# Patient Record
Sex: Male | Born: 1950 | Race: White | Hispanic: No | Marital: Married | Smoking: Former smoker
Health system: Southern US, Community
[De-identification: ages and names within clinical notes are randomized; demographics above are authoritative.]

## PROBLEM LIST (undated history)

## (undated) DIAGNOSIS — F419 Anxiety disorder, unspecified: Secondary | ICD-10-CM

## (undated) DIAGNOSIS — J449 Chronic obstructive pulmonary disease, unspecified: Secondary | ICD-10-CM

## (undated) DIAGNOSIS — F32A Depression, unspecified: Secondary | ICD-10-CM

## (undated) DIAGNOSIS — I861 Scrotal varices: Secondary | ICD-10-CM

## (undated) DIAGNOSIS — Z8547 Personal history of malignant neoplasm of testis: Secondary | ICD-10-CM

## (undated) DIAGNOSIS — K219 Gastro-esophageal reflux disease without esophagitis: Secondary | ICD-10-CM

## (undated) DIAGNOSIS — J189 Pneumonia, unspecified organism: Secondary | ICD-10-CM

## (undated) DIAGNOSIS — R351 Nocturia: Secondary | ICD-10-CM

## (undated) DIAGNOSIS — C801 Malignant (primary) neoplasm, unspecified: Secondary | ICD-10-CM

## (undated) DIAGNOSIS — Z720 Tobacco use: Secondary | ICD-10-CM

## (undated) DIAGNOSIS — Z8719 Personal history of other diseases of the digestive system: Secondary | ICD-10-CM

## (undated) DIAGNOSIS — F329 Major depressive disorder, single episode, unspecified: Secondary | ICD-10-CM

## (undated) DIAGNOSIS — M1990 Unspecified osteoarthritis, unspecified site: Secondary | ICD-10-CM

## (undated) DIAGNOSIS — Z87898 Personal history of other specified conditions: Secondary | ICD-10-CM

## (undated) DIAGNOSIS — N400 Enlarged prostate without lower urinary tract symptoms: Secondary | ICD-10-CM

## (undated) DIAGNOSIS — G4700 Insomnia, unspecified: Secondary | ICD-10-CM

## (undated) DIAGNOSIS — G250 Essential tremor: Secondary | ICD-10-CM

## (undated) DIAGNOSIS — Z973 Presence of spectacles and contact lenses: Secondary | ICD-10-CM

## (undated) HISTORY — DX: Personal history of other specified conditions: Z87.898

## (undated) HISTORY — DX: Scrotal varices: I86.1

## (undated) HISTORY — PX: MASTECTOMY: SHX3

## (undated) HISTORY — DX: Tobacco use: Z72.0

## (undated) HISTORY — PX: APPENDECTOMY: SHX54

## (undated) HISTORY — PX: OTHER SURGICAL HISTORY: SHX169

## (undated) HISTORY — DX: Personal history of malignant neoplasm of testis: Z85.47

---
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
## 1998-10-06 ENCOUNTER — Encounter: Payer: Self-pay | Admitting: Emergency Medicine

## 1998-10-06 ENCOUNTER — Emergency Department (HOSPITAL_COMMUNITY): Admission: EM | Admit: 1998-10-06 | Discharge: 1998-10-06 | Payer: Self-pay | Admitting: Emergency Medicine

## 1998-10-07 ENCOUNTER — Encounter: Payer: Self-pay | Admitting: Emergency Medicine

## 2001-04-02 ENCOUNTER — Encounter: Payer: Self-pay | Admitting: Emergency Medicine

## 2001-04-02 ENCOUNTER — Emergency Department (HOSPITAL_COMMUNITY): Admission: EM | Admit: 2001-04-02 | Discharge: 2001-04-02 | Payer: Self-pay | Admitting: Emergency Medicine

## 2001-11-29 ENCOUNTER — Observation Stay (HOSPITAL_COMMUNITY): Admission: EM | Admit: 2001-11-29 | Discharge: 2001-11-30 | Payer: Self-pay | Admitting: *Deleted

## 2001-11-29 ENCOUNTER — Encounter: Payer: Self-pay | Admitting: Emergency Medicine

## 2003-03-06 DIAGNOSIS — Z8547 Personal history of malignant neoplasm of testis: Secondary | ICD-10-CM

## 2003-03-06 HISTORY — PX: ORCHIECTOMY: SHX2116

## 2003-03-06 HISTORY — DX: Personal history of malignant neoplasm of testis: Z85.47

## 2003-09-20 ENCOUNTER — Encounter: Admission: RE | Admit: 2003-09-20 | Discharge: 2003-09-20 | Payer: Self-pay | Admitting: Urology

## 2003-09-24 ENCOUNTER — Ambulatory Visit (HOSPITAL_COMMUNITY): Admission: RE | Admit: 2003-09-24 | Discharge: 2003-09-24 | Payer: Self-pay | Admitting: Urology

## 2003-09-24 ENCOUNTER — Encounter (INDEPENDENT_AMBULATORY_CARE_PROVIDER_SITE_OTHER): Payer: Self-pay | Admitting: *Deleted

## 2003-09-24 ENCOUNTER — Ambulatory Visit (HOSPITAL_BASED_OUTPATIENT_CLINIC_OR_DEPARTMENT_OTHER): Admission: RE | Admit: 2003-09-24 | Discharge: 2003-09-24 | Payer: Self-pay | Admitting: Urology

## 2003-10-25 ENCOUNTER — Ambulatory Visit: Admission: RE | Admit: 2003-10-25 | Discharge: 2003-12-02 | Payer: Self-pay | Admitting: Radiation Oncology

## 2003-12-30 ENCOUNTER — Ambulatory Visit: Admission: RE | Admit: 2003-12-30 | Discharge: 2003-12-30 | Payer: Self-pay | Admitting: Radiation Oncology

## 2004-11-03 ENCOUNTER — Ambulatory Visit (HOSPITAL_COMMUNITY): Admission: RE | Admit: 2004-11-03 | Discharge: 2004-11-03 | Payer: Self-pay | Admitting: Urology

## 2005-03-28 ENCOUNTER — Encounter (INDEPENDENT_AMBULATORY_CARE_PROVIDER_SITE_OTHER): Payer: Self-pay | Admitting: Specialist

## 2005-03-28 ENCOUNTER — Ambulatory Visit (HOSPITAL_COMMUNITY): Admission: RE | Admit: 2005-03-28 | Discharge: 2005-03-28 | Payer: Self-pay | Admitting: Urology

## 2005-09-26 ENCOUNTER — Ambulatory Visit (HOSPITAL_COMMUNITY): Admission: RE | Admit: 2005-09-26 | Discharge: 2005-09-26 | Payer: Self-pay | Admitting: Urology

## 2006-11-08 ENCOUNTER — Ambulatory Visit (HOSPITAL_COMMUNITY): Admission: RE | Admit: 2006-11-08 | Discharge: 2006-11-08 | Payer: Self-pay | Admitting: Urology

## 2010-07-21 NOTE — Op Note (Signed)
NAME:  Cameron [name], Cameron [name]                        ACCOUNT NO.:  [id number]   MEDICAL RECORD NO.:  [id number]                   PATIENT TYPE:  INP   LOCATION:  4710                                 FACILITY:  [facility]   PHYSICIAN:  Duke Salvia, M.D. [facility]           DATE OF BIRTH:  08/11/1950   DATE OF PROCEDURE:  11/30/2001  DATE OF DISCHARGE:  11/30/2001                                 OPERATIVE REPORT   I had the privilege of seeing the patient in electrophysiology consultation  for an episode of syncope.   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  His wife heard a  thud.  Thinking it was her son having dropped a book, she waited and then  there was a crash.  She went into the bathroom and found her husband draped  over the toilet bowl, his face bleeding where he had apparently hit the  towel rack.  She and her son after a few minutes were able to help him up to  his feet and started back to the bedroom where he collapsed again.  He was  finally put to bed.  He was observed to be quite diaphoretic.  He was  extremely pale.  There were no comments about nausea.  Finally, he was  brought to the emergency department where he was found to be relatively  hypotensive with blood pressures in the high 80s.  His pulse was not  elevated and there was not significant evidence of orthostatic hypotension.   The patient was admitted and put on telemetry.  There patient had recurrent  episodes of junctional rhythm that had been asymptomatic.   The patient has no antecedent history of syncope or episodes  lightheadedness.  The patient does have some exertional and nonexertional  chest pain.  He was actually seen in the emergency room a couple of months  ago and was discharged with a diagnosis of gastritis.   His cardiac risk factors are notable for cigarettes at two packs per day.  He does not have  hypertension.  There is a family history of vascular  disease in his mother.  His cholesterol was 151, but a break down is not  available.   The patient also has a significant history of substance excessive substance  use such that his wife has been concerned about his alcohol intake.  He had  typically been drinking six plus beers plus mixed drinks per day.  This was  associated temporally also recently with an inability to sleep and eat and  he has lost about 20 some odd pounds.  Evaluation by Gaspar Garbe,  M.D. felt that this was likely related to depression and anxiety and he was  treated with clonazepam for sleep and Zoloft.  This was started a couple of  months ago.  Over the last couple of weeks, the patient has stopped drinking  alcohol during the week waiting until the weekend.  The evening of his  syncope, he had had tall beers as well as a mixed drink prior to going to  bed.  He uses a significant amount of caffeinated beverages.  He does not  have orthostatic lightheadedness in general.   PAST SURGICAL HISTORY:  Notable for surgery for undescended testicle,  varicocele surgery, appendectomy and then gynecomastia x 2 bilaterally.   REVIEW OF SYSTEMS:  Notable for recurrent problems with left arm tingling,  particularly on awakening.   FAMILY HISTORY:  Notable as above.   SOCIAL HISTORY:  It is also notable that he has two children.   PHYSICAL EXAMINATION:  VITAL SIGNS:  On examination initially on  presentation his blood pressure was 89/57.  __________.  Since then his  blood pressure has been 105 and today 110, 116/70.  His pulse was 56.  His  respirations were 14 and labored.  HEENT:  Exam demonstrated no scleral icterus, no xanthomata.  His neck veins  were flat.  The carotids were brisk and full bilaterally without bruits.  BACK:  Without kyphosis, scoliosis.  LUNGS:  Clear.  HEART:  Sounds were regular without murmurs or gallops.  ABDOMEN:  Soft with active  bowel sounds. No midline pulsation or  hepatomegaly.   Femoral pulses were 2+, distal pulses were intact.  No cyanosis, clubbing or  edema.  NEUROLOGICAL:  Exam was grossly normal.  SKIN:  Exam notable for repair of laceration across the bridge.   Electrocardiogram demonstrated sinus rhythm at 66 with intervals of  0.16/0.11/0.41.  There were no  STT changes.  There was 1 mm of ST segment  elevation in leads v1 to v3 consistent with early repolarization.   LABORATORY DATA:  Unrevealing.   IMPRESSION:  1. Syncope, probably early mediated and post micturition.  2. Junctional rhythm at baseline question asymptomatic sinus node     dysfunction.  3. Cigarette/alcohol use/abuse for depression/OCD on SSI therapy.   The patient likely had early mediated syncope temporally related to  urination.  This may have been aggravated by relative dehydration in the  setting of his alcohol intake from the previous night although with his  longstanding history of alcohol intake this would be a little bit  surprising.  It may also be that there was interaction between the  alcohol/dehydration and the Zoloft.  In some patient's SSI therapy has been  prosyncopal and when described for early mediated syncope.   The major differential would occur if the patient had structural heart  disease and specifically inferior ischemia associated with Bezold-Jarisch  reflex.   Based on the above therefore I would recommend  1. Consider use of a different  SSRI.  2. Exercise stress test in two weeks to exclude structural heart disease     which we can arrange as an outpatient.  3. Decrease his alcohol intake.   Thank you very much for this consultation.                                               Duke Salvia, M.D. [facility]    SCK/MEDQ  D:  11/30/2001  T:  12/01/2001  Job:  [id number]   cc:   Gaspar Garbe, M.D.

## 2010-07-21 NOTE — H&P (Signed)
NAME:  Cameron [name], Cameron [name]                        ACCOUNT NO.:  [id number]   MEDICAL RECORD NO.:  [id number]                   PATIENT TYPE:  OBV   LOCATION:  4710                                 FACILITY:  [facility]   PHYSICIAN:  Barry Dienes. Eloise Harman, M.D.            DATE OF BIRTH:  May 30, 1950   DATE OF ADMISSION:  11/29/2001  DATE OF DISCHARGE:  11/30/2001                                HISTORY & PHYSICAL   CHIEF COMPLAINT:  Passed out x 3.   HISTORY OF PRESENT ILLNESS:  The patient is a 60 year old white male who  last night got up to go to the bathroom and two loud bumps were heard by his  family. He was  found lying on his face with an abrasion on the bridge of  his nose in the  bathroom and briefly unconscious. He stood up, was quite  pale and had a witnessed episode of syncope. He has little recall of the  events around the time of fainting. He has no prior history of syncope. Last  night he had his usual four large beers and one mixed drink that he often  has on the weekends to help him fall asleep. He had been cutting back  somewhat on his alcohol consumption.   He denied current symptoms of neck pain, headache or nausea. He complains of  chronic left shoulder  pain and tingling sensation throughout his left upper  extremity. There is no family history of fainting or  sudden cardiac death.   PAST MEDICAL HISTORY:  1. Significant for weekend alcohol use of approximately six beers per     evening.  2. Depression.  3. Chronic insomnia.  4. He also has chronic left shoulder and arm pain.   MEDICATIONS:  Prior to  admission included:  1. Zoloft 50 mg p.o. q.d.  2. Clonazepam 1 tablet p.o. q.h.s.   ALLERGIES:  No known drug allergies.   PAST SURGICAL HISTORY:  1. At age 52 years he had surgery for an undescended testicle removal.  2. He has also had appendectomy.  3. Varicocele repair.  4. Bilateral gynecomastia lumpectomy excisions due to medications used for  infertility.   FAMILY HISTORY:  Father died at age 26 of prostate carcinoma. Mother died at  age 52 of coronary artery disease. She also had a history of multiple CVAs  and hypertension. He has a twin sister who has hyperlipidemia and  hypertension and another sister who has hypertension. There is no family  history of close relatives with diabetes mellitus, premature coronary artery  disease, or colon cancer.   SOCIAL HISTORY:  He is married and works for the [employer]. 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 He has two  children, age 71 years and 18 years. He has a history of chronic weekend  alcohol use, but no history of DWI infractions or work related problems  related to alcohol. He has ongoing  tobacco use of two packs per day.   REVIEW OF SYSTEMS:  Significant for gradual weight loss of approximately 20  pounds over the past three months associated with decreased appetite. He  denies abdominal pain, nausea, constipation or rectal bleeding. He has  chronic left shoulder pain and left upper extremity tingling sensation that  is worse with lifting heavy objects over his head. He denies chest pain,  palpitations, TIA symptoms or other arthralgias. He denies difficulty  voiding.   PHYSICAL EXAMINATION:  VITAL SIGNS:  His initial orthostatic vital signs  included: Supine  blood pressure 89/57 with a pulse of 74, upright standing  blood pressure 85/55 with a pulse of 69. Current vital signs:  Blood  pressure 105/67, pulse 69, respiratory rate 19, temperature 97.2.  GENERAL:  He is a well developed, well nourished white male in no apparent  distress, sitting on an emergency room gurney with a shallow laceration  across the bridge of his nose.  HEENT:  Otherwise unremarkable.  NECK:  Supple with a full range of motion. No jugular venous  distention or  carotid bruits.  CHEST:  Clear to auscultation.  HEART:  Regular rate and rhythm, S1, S2 were  present without murmurs, rubs,  gallops.  ABDOMEN:  Benign.  EXTREMITIES:  Without cyanosis, clubbing or edema.  NEUROLOGIC:  Alert and oriented x 3 with no focal deficits. His pain in the  left shoulder is reproduced with pressure over the left carpal tunnel. He  has a normal range of motion and normal rotator cuff strength.   LABORATORY DATA:  An EKG showed normal sinus rhythm and was within normal  limits. A C-spine x-ray showed:  1. 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  2. Degenerative joint disease at left C5-6 level with neuroforaminal     narrowing and moderate degenerative joint disease  at the C6-7 level.  A CT scan of the  head (noncontrast) showed no acute disease with an old  left basal ganglia lacune.   White blood cell count 6.2, hemoglobin 15, hematocrit 44, platelet count  230. Serum sodium 138, potassium 3.8, chloride  108, CO2 22,  BUN 9,  creatinine  0.8, glucose 92. Serum albumin 3.7. Blood alcohol level 138.  Urine drug abuse screen was normal.   IMPRESSION/PLAN:  1. Syncope. This is likely vasovagal, brought on by mild dehydration and     micturition. Alternatively, his syncope could be due to the preceding     moderate alcohol consumption. I doubt that he has an arrhythmia or     cerebrovascular disease causing his brief loss of consciousness. Will     plan on overnight telemetry to rule out a dangerous arrhythmia and if OK,     complete his workup as an outpatient. I discussed the futility of using     alcohol for insomnia treatment.  2. Left shoulder  pain. This was possibly due to cervical spine degenerative     disc disease or carpal tunnel syndrome. This workup could be completed as     an outpatient.  3. Depression. Appears to be stable on Zoloft with secondary mild chronic     diarrhea. We will check his stools with Hemoccult testing.  Barry Dienes Eloise Harman, M.D.    DGP/MEDQ  D:  11/29/2001  T:   12/02/2001  Job:  [id number]   cc:   Gaspar Garbe, M.D.

## 2010-07-21 NOTE — Op Note (Signed)
NAME:  Cameron [name], Cameron [name] NO.:  [id number]   MEDICAL RECORD NO.:  [id number]           PATIENT TYPE:   LOCATION:                                 FACILITY:   PHYSICIAN:  Courtney Paris, M.D.  DATE OF BIRTH:   DATE OF PROCEDURE:  03/28/2005  DATE OF DISCHARGE:                                 OPERATIVE REPORT   PREOPERATIVE DIAGNOSIS:  Large left scrotal cyst, previous right testicular  carcinoma.   POSTOPERATIVE DIAGNOSIS:  Large left scrotal cyst, previous right testicular  carcinoma.   OPERATION:  Excision of large (5 cm) left scrotal cyst.   ANESTHESIA:  General.   SURGEON:  Courtney Paris, M.D.   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 He enters with a large  sebaceous cyst of the scrotum that came up, drained spontaneously, for  excision. He had a right radical orchiectomy July 2005, had mediastinal  radiation, and had a varicocele operation many years ago in the 1980s.   The patient was placed supine on the operating table. After satisfactory  induction general anesthesia, was prepped, shaved, and draped in the usual  sterile fashion. The large cyst to be palpated and was measured to be about  5 cm in greatest diameter. This was elliptically excised using Mayo  scissors; and came out intact. Bovie was used to effect good hemostasis; and  the wound was closed in two layers with the 3-0 chromic catgut to obliterate  the space of the scrotal lesion; then interrupted 4-0 chromic mattress  sutures for the skin. A dressing of Dermabond was applied. The patient was  taken to recovery room in good condition. He was later discharged as an  outpatient.      Courtney Paris, M.D.  Electronically Signed     HMK/MEDQ  D:  03/28/2005  T:  03/28/2005  Job:  [id number]

## 2010-07-21 NOTE — Op Note (Signed)
NAME:  Cameron [name], Cameron [name]                        ACCOUNT NO.:  [id number]   MEDICAL RECORD NO.:  [id number]                   PATIENT TYPE:  AMB   LOCATION:  [facility]                                 FACILITY:  [facility]   PHYSICIAN:  Rozanna Boer., M.D.      DATE OF BIRTH:  [birth year]   DATE OF PROCEDURE:  09/24/2003  DATE OF DISCHARGE:                                 OPERATIVE REPORT   PREOPERATIVE DIAGNOSIS:  Right undescended testis with mass and  calcifications.   POSTOPERATIVE DIAGNOSIS:  Right undescended testis with mass and  calcifications, pending pathological report.   PROCEDURE:  Right radical orchiectomy.   ANESTHESIA:  General.   SURGEON:  Courtney Paris, M.D.   BRIEF HISTORY:  This 60 year old patient with atrophic, undescended right  testis that was brought down at age 56, comes in for right orchiectomies.  He has had some negative tumor markers but had a mass which was hypoechoic  and had some multiple calcifications in the right testis.  He has also had  gynecomastia surgery in 1982 and in 1983.  I did a varicocele operation in  1980.  He has had some left epididymitis in the past with this, and it has  resolved.  He has two children after the varicocele ligation was done.   The patient was placed on the operating room table in a supine position  after satisfactory induction of general anesthesia, he was shaved, prepped  and draped in the usual sterile fashion in his right groin.  The old  inguinal incision was used, incised, and the dissection carried down to the  scar tissue to expose the external oblique fascia.  This was opened with  Mayo scissors, as it was quite dense with scar tissue.  Some vessels were  either cauterized or tied with chromic as they were identified.  The cord  was then identified, and the testis was delivered up from the scrotum on the  right side.  Using sharp and blunt dissection, I was able to dissect the  testis and the cord  back to the internal inguinal ring.  At this point, I  clamped the cord and suture-ligated this with #1 silk ties and suture.  These ties were left long in case they would be needed to identified later.  The hemostasis was good.  The external oblique fascia was then closed with  interrupted 3-0 silk suture pop-off sutures, the subcu with 3-0 chronic, and  a running sublinticular 4-0 Dexon was used to close the skin.  Sterile dry  dressings were applied, and 10 cc of Marcaine was injected into the incision  for postoperative pain relief.  He was given some Toradol in the recovery  room.  He will be later discharged as an outpatient with detailed written  instructions.  Come back in a week for followup.  Rozanna Boer., M.D.    HMK/MEDQ  D:  09/24/2003  T:  09/25/2003  Job:  [id number]

## 2011-07-17 ENCOUNTER — Ambulatory Visit (INDEPENDENT_AMBULATORY_CARE_PROVIDER_SITE_OTHER): Payer: Managed Care, Other (non HMO) | Admitting: Emergency Medicine

## 2011-07-17 ENCOUNTER — Encounter: Payer: Self-pay | Admitting: Emergency Medicine

## 2011-07-17 DIAGNOSIS — Z720 Tobacco use: Secondary | ICD-10-CM

## 2011-07-17 DIAGNOSIS — R9389 Abnormal findings on diagnostic imaging of other specified body structures: Secondary | ICD-10-CM | POA: Insufficient documentation

## 2011-07-17 DIAGNOSIS — F17200 Nicotine dependence, unspecified, uncomplicated: Secondary | ICD-10-CM

## 2011-07-17 NOTE — Patient Instructions (Signed)
Your CT scan does not show a left-sided nodule.  This is good news. You do not need a repeat scan at this time, but you may qualify for a low-dose screening scan in the future. We can discuss this next year.  We will perform pulmonary function testing at your next visit and then review at that time The most important aspect of your care at this time is that YOU MUST STOP SMOKING. We can help you. Please call our office when you are ready to set a quit date and we will make a plan.

## 2011-07-17 NOTE — Progress Notes (Signed)
Subjective:    Patient ID: Cameron [name], male    DOB: [birth year], 61 y.o.   MRN: [id number]  HPI 61 yo smoker (50 pk-yrs), hx of testicular CA, hyperlipidemia. Followed by Dr Wylene Simmer. Standard screening CXR 06/08/11 showed ? L hilar nodule. He recalls being told that he had a L "spot" on lung 20 yrs ago. Prompted a CT scan chest 06/29/11 showed no L sided abnormality, probably tiny apical granuloma. He denies dyspnea except w heavy exertion.  Not on any BD's.    Review of Systems  Constitutional: Negative for fever and unexpected weight change.  HENT: Positive for postnasal drip. Negative for ear pain, nosebleeds, congestion, sore throat, rhinorrhea, sneezing, trouble swallowing, dental problem and sinus pressure.   Eyes: Negative for redness and itching.  Respiratory: Positive for cough. Negative for chest tightness, shortness of breath and wheezing.   Cardiovascular: Negative for palpitations and leg swelling.  Gastrointestinal: Negative for nausea and vomiting.  Genitourinary: Negative for dysuria.  Musculoskeletal: Negative for joint swelling.  Skin: Negative for rash.  Neurological: Negative for headaches.  Hematological: Does not bruise/bleed easily.  Psychiatric/Behavioral: Negative for dysphoric mood. The patient is nervous/anxious.     Past Medical History  Diagnosis Date  . Hx of gynecomastia   . History of testicular cancer   . Varicocele   . Tobacco abuse      Family History  Problem Relation Age of Onset  . Prostate cancer Father      History   Social History  . Marital Status: Married    Spouse Name: N/A    Number of Children: 2  . Years of Education: N/A   Occupational History  . Retired [employer]   Social History Main Topics  . Smoking status: Current Everyday Smoker    Types: Cigarettes  . Smokeless tobacco: Current User    Types: Chew   Comment: Started smoking at age 80.  Currently smoking 1 1/2 ppd. Uses chewing tobacco once weekly.   .  Alcohol Use: Yes     drinks 6 beers per day  . Drug Use: No  . Sexually Active: Not on file   Other Topics Concern  . Not on file   Social History Narrative  . No narrative on file     No Known Allergies   No outpatient prescriptions prior to visit.         Objective:   Physical Exam Filed Vitals:   07/17/11 1542  BP: 136/78  Pulse: 71  Temp: 98.4 F (36.9 C)   Gen: Pleasant, well-nourished, in no distress,  normal affect  ENT: No lesions,  mouth clear,  oropharynx clear, no postnasal drip  Neck: No JVD, no TMG, no carotid bruits  Lungs: No use of accessory muscles, no dullness to percussion, clear without rales or rhonchi  Cardiovascular: RRR, heart sounds normal, no murmur or gallops, no peripheral edema  Musculoskeletal: No deformities, no cyanosis or clubbing  Neuro: alert, non focal  Skin: Warm, no lesions or rash     Assessment & Plan:  Tobacco abuse No dyspnea but at risk of course for COPD. Will check PFT and review w him next time Discussed cessation in detail. He will call if ready to set quit date.   Abnormal CT scan, chest No L-hilar nodule as suspected on CXR. There is a tiny calcified R apical granuloma that does not need specific f/u. He does qualify for low-dose Ct screening, I will work on setting this  up for him next year

## 2011-07-17 NOTE — Assessment & Plan Note (Signed)
No L-hilar nodule as suspected on CXR. There is a tiny calcified R apical granuloma that does not need specific f/u. He does qualify for low-dose Ct screening, I will work on setting this up for him next year

## 2011-07-17 NOTE — Assessment & Plan Note (Signed)
No dyspnea but at risk of course for COPD. Will check PFT and review w him next time Discussed cessation in detail. He will call if ready to set quit date.

## 2011-07-26 ENCOUNTER — Ambulatory Visit (INDEPENDENT_AMBULATORY_CARE_PROVIDER_SITE_OTHER): Payer: Managed Care, Other (non HMO) | Admitting: Emergency Medicine

## 2011-07-26 DIAGNOSIS — F17200 Nicotine dependence, unspecified, uncomplicated: Secondary | ICD-10-CM

## 2011-07-26 DIAGNOSIS — R918 Other nonspecific abnormal finding of lung field: Secondary | ICD-10-CM

## 2011-07-26 DIAGNOSIS — Z720 Tobacco use: Secondary | ICD-10-CM

## 2011-07-26 LAB — PULMONARY FUNCTION TEST

## 2011-07-26 NOTE — Progress Notes (Signed)
PFT done today. 

## 2011-08-20 ENCOUNTER — Encounter: Payer: Self-pay | Admitting: Emergency Medicine

## 2014-06-03 ENCOUNTER — Telehealth: Payer: Self-pay

## 2014-06-03 NOTE — Telephone Encounter (Signed)
06/03/14 Disc Received from [facility] and filed on shelf.Britt Bottom

## 2017-03-22 ENCOUNTER — Emergency Department (HOSPITAL_COMMUNITY): Payer: POS

## 2017-03-22 ENCOUNTER — Encounter (HOSPITAL_COMMUNITY): Payer: Self-pay | Admitting: Emergency Medicine

## 2017-03-22 ENCOUNTER — Emergency Department (HOSPITAL_COMMUNITY)
Admission: EM | Admit: 2017-03-22 | Discharge: 2017-03-22 | Disposition: A | Discharge status: Home / Self Care | Payer: POS | Attending: Physician Assistant | Admitting: Physician Assistant

## 2017-03-22 DIAGNOSIS — Z8547 Personal history of malignant neoplasm of testis: Secondary | ICD-10-CM | POA: Insufficient documentation

## 2017-03-22 DIAGNOSIS — R062 Wheezing: Secondary | ICD-10-CM | POA: Insufficient documentation

## 2017-03-22 DIAGNOSIS — F17210 Nicotine dependence, cigarettes, uncomplicated: Secondary | ICD-10-CM | POA: Diagnosis not present

## 2017-03-22 DIAGNOSIS — R079 Chest pain, unspecified: Secondary | ICD-10-CM

## 2017-03-22 DIAGNOSIS — R0789 Other chest pain: Secondary | ICD-10-CM | POA: Diagnosis not present

## 2017-03-22 DIAGNOSIS — Z79899 Other long term (current) drug therapy: Secondary | ICD-10-CM | POA: Diagnosis not present

## 2017-03-22 HISTORY — DX: Major depressive disorder, single episode, unspecified: F32.9

## 2017-03-22 HISTORY — DX: Depression, unspecified: F32.A

## 2017-03-22 HISTORY — DX: Anxiety disorder, unspecified: F41.9

## 2017-03-22 HISTORY — DX: Unspecified osteoarthritis, unspecified site: M19.90

## 2017-03-22 HISTORY — DX: Benign prostatic hyperplasia without lower urinary tract symptoms: N40.0

## 2017-03-22 HISTORY — DX: Gastro-esophageal reflux disease without esophagitis: K21.9

## 2017-03-22 LAB — CBC
HCT: 42.1 % (ref 39.0–52.0)
Hemoglobin: 14.3 g/dL (ref 13.0–17.0)
MCH: 31.8 pg (ref 26.0–34.0)
MCHC: 34 g/dL (ref 30.0–36.0)
MCV: 93.6 fL (ref 78.0–100.0)
PLATELETS: 202 10*3/uL (ref 150–400)
RBC: 4.5 MIL/uL (ref 4.22–5.81)
RDW: 13.9 % (ref 11.5–15.5)
WBC: 6.2 10*3/uL (ref 4.0–10.5)

## 2017-03-22 LAB — I-STAT TROPONIN, ED
Troponin i, poc: 0 ng/mL (ref 0.00–0.08)
Troponin i, poc: 0 ng/mL (ref 0.00–0.08)

## 2017-03-22 LAB — BASIC METABOLIC PANEL
Anion gap: 7 (ref 5–15)
BUN: 8 mg/dL (ref 6–20)
CHLORIDE: 106 mmol/L (ref 101–111)
CO2: 26 mmol/L (ref 22–32)
CREATININE: 0.99 mg/dL (ref 0.61–1.24)
Calcium: 8.9 mg/dL (ref 8.9–10.3)
GFR calc non Af Amer: >60 mL/min (ref >60)
Glucose, Bld: 102 mg/dL — ABNORMAL HIGH (ref 65–99)
Potassium: 4.1 mmol/L (ref 3.5–5.1)
Sodium: 139 mmol/L (ref 135–145)

## 2017-03-22 MED ORDER — OPTICHAMBER DIAMOND MISC
Freq: Once | Status: DC
  Filled 2017-03-22: qty 1

## 2017-03-22 MED ORDER — ALBUTEROL SULFATE HFA 108 (90 BASE) MCG/ACT IN AERS
2.0000 | INHALATION_SPRAY | Freq: Once | RESPIRATORY_TRACT | Status: AC
  Administered 2017-03-22: 2 via RESPIRATORY_TRACT
  Filled 2017-03-22: qty 6.7

## 2017-03-22 NOTE — ED Notes (Signed)
Patient transported to X-ray 

## 2017-03-22 NOTE — ED Provider Notes (Signed)
Patient placed in Quick Look pathway, seen and evaluated   Chief Complaint: Chest pain   HPI:   67 year old male presents today with 2 months of intermittent left sided chest pain.  He notes this comes and goes, is not worse with exertion, movement or palpation or inspiration.  He denies any history of the same, denies any associated shortness of breath.  Patient denies any lower extremity swelling or edema.  Denies any history of ACS, reports he is a smoker.  He reports very minimal pain at the time of my evaluation.  ROS: Positive for chest pain (one)  Physical Exam:   Gen: No distress  Neuro: Awake and Alert  Skin: Warm    Focused Exam: Chest wall without rash, nontender to palpation, full active range of motion of the shoulder without pain.  Regular rate and rhythm with no murmur, lower extremities without swelling or edema   Initiation of care has begun. The patient has been counseled on the process, plan, and necessity for staying for the completion/evaluation, and the remainder of the medical screening examination    Okey Regal, Hershal Coria 03/22/17 1514    Valarie Merino, MD 03/23/17 1355

## 2017-03-22 NOTE — ED Notes (Signed)
PT states understanding of care given, follow up care, and medication prescribed. PT ambulated from ED to car with a steady gait. 

## 2017-03-22 NOTE — ED Provider Notes (Signed)
[facility] EMERGENCY DEPARTMENT Provider Note   CSN: [id number] Arrival date & time: 03/22/17  1458     History   Chief Complaint Chief Complaint  Patient presents with  . Chest Pain    HPI Cameron [name] is a 67 y.o. male.  HPI   Patient is a 67 year old male presented with intermittent chest pain for the last several months.  He reports that it is not exertionally related.  He reports is mostly worse at night.  It is more of a throbbing in his left shoulder.  Not associated with diaphoresis.  He reports there is not really any pattern to it.  He was at a urologist appointment today and checked the box that he had chest pain in the last several weeks.  They called his primary care physician told him come to the emergency department Patient concerned because his cousin had "widow maker".  He wanted to make sure that he got checked out.   No current pain.  Past Medical History:  Diagnosis Date  . Anxiety   . Arthritis   . BPH (benign prostatic hyperplasia)   . Depression   . GERD (gastroesophageal reflux disease)   . History of testicular cancer   . Hx of gynecomastia   . Tobacco abuse   . Varicocele     Patient Active Problem List   Diagnosis Date Noted  . Abnormal CT scan, chest 07/17/2011  . Tobacco abuse 07/17/2011    Past Surgical History:  Procedure Laterality Date  . APPENDECTOMY    . ORCHIECTOMY  2005   right  . varicocoele         Home Medications    Prior to Admission medications   Medication Sig Start Date End Date Taking? Authorizing Provider  clonazePAM (KLONOPIN) 0.5 MG tablet Take 0.25 mg by mouth at bedtime.    Yes [provider]  meloxicam (MOBIC) 7.5 MG tablet Take 7.5 mg by mouth daily. 03/05/17  Yes [provider]  omeprazole (PRILOSEC OTC) 20 MG tablet Take 20 mg by mouth daily.   Yes [provider]  sertraline (ZOLOFT) 50 MG tablet Take 50 mg by mouth daily. 03/04/17  Yes [provider]  tamsulosin (FLOMAX) 0.4 MG CAPS capsule Take 0.4 mg by mouth at bedtime. 03/04/17  Yes [provider]    Family History Family History  Problem Relation Age of Onset  . Prostate cancer Father     Social History Social History   Tobacco Use  . Smoking status: Current Every Day Smoker    Types: Cigarettes  . Smokeless tobacco: Current User    Types: Chew  . Tobacco comment: Started smoking at age 28.  Currently smoking 1 1/2 ppd. Uses chewing tobacco once weekly.   Substance Use Topics  . Alcohol use: Yes    Comment: drinks 6 beers per day  . Drug use: No     Allergies   Patient has no known allergies.   Review of Systems Review of Systems  Constitutional: Negative for activity change.  Respiratory: Negative for shortness of breath.   Cardiovascular: Positive for chest pain.  Gastrointestinal: Negative for abdominal pain.  All other systems reviewed and are negative.    Physical Exam Updated Vital Signs BP (!) 144/80   Pulse (!) 58   Temp 98.2 F (36.8 C) (Oral)   Resp 19   SpO2 99%   Physical Exam  Constitutional: He is oriented to person, place, and time.  He appears well-nourished.  HENT:  Head: Normocephalic and atraumatic.  Eyes: Conjunctivae are normal.  Cardiovascular: Normal rate, regular rhythm and normal pulses.  Pulmonary/Chest: Effort normal and breath sounds normal. No respiratory distress. He has no decreased breath sounds.  Neurological: He is oriented to person, place, and time.  Skin: Skin is warm and dry. He is not diaphoretic.  Psychiatric: He has a normal mood and affect. His behavior is normal.     ED Treatments / Results  Labs (all labs ordered are listed, but only abnormal results are displayed) Labs Reviewed  BASIC METABOLIC PANEL - Abnormal; Notable for the following components:      Result Value   Glucose, Bld 102 (*)    All other components within normal limits  CBC  I-STAT TROPONIN, ED  I-STAT  TROPONIN, ED    EKG  EKG Interpretation None       Radiology Dg Chest 2 View  Result Date: 03/22/2017 CLINICAL DATA:  Upper left chest pain. History of testicular cancer. EXAM: CHEST  2 VIEW COMPARISON:  Body CT 03/25/2008 FINDINGS: Cardiomediastinal silhouette is normal. Mediastinal contours appear intact. There is no evidence of focal airspace consolidation, pleural effusion or pneumothorax. Osseous structures are without acute abnormality. Soft tissues are grossly normal. IMPRESSION: No active cardiopulmonary disease. Electronically Signed   By: Fidela Salisbury M.D.   On: 03/22/2017 16:07    Procedures Procedures (including critical care time)  Medications Ordered in ED Medications  albuterol (PROVENTIL HFA;VENTOLIN HFA) 108 (90 Base) MCG/ACT inhaler 2 puff (not administered)  AEROCHAMBER PLUS FLO-VU MEDIUM MISC 1 each (not administered)     Initial Impression / Assessment and Plan / ED Course  I have reviewed the triage vital signs and the nursing notes.  Pertinent labs & imaging results that were available during my care of the patient were reviewed by me and considered in my medical decision making (see chart for details).      Patient is a 67 year old male presented with intermittent chest pain for the last several months.  He reports that it is not exertionally related.  He reports is mostly worse at night.  It is more of a throbbing in his left shoulder.  Not associated with diaphoresis.  He reports there is not really any pattern to it.  He was at a urologist appointment today and checked the box that he had chest pain in the last several weeks.  They called his primary care physician told him come to the emergency department Patient concerned because his cousin had "widow maker".  He wanted to make sure that he got checked out.   Patient has no hypertension no hyperlipidemia no diabetes.  Is a smoker.  No current pain.  10:09 PM Serial troponins are negative.   Does not sound typical for cardiac disease.  Patient does have a mild wheeze on exam he is a current smoker.   Will give albuterol.  Outpatient follow-up with primary care or cardiology likely will need risk stratification but likely can be done as an outpatient.   Final Clinical Impressions(s) / ED Diagnoses   Final diagnoses:  None    ED Discharge Orders    None       Macarthur Critchley, MD 03/22/17 2213

## 2017-03-22 NOTE — ED Triage Notes (Signed)
Pt reports he was at his urologist today, c/o having upper left chest and shoulder pain for the past few months, was told to come to the ED for further eval. PA Hedges at bedside to screen patient.

## 2017-03-22 NOTE — Discharge Instructions (Signed)
We are unsure what is causing your pain today.  It could be related to your heart so we want you to follow-up with a cardiologist.  However we feel that it is safe for you to return home.  Please return with any increased chest pain, shortness of breath, or other concerns.

## 2017-04-24 ENCOUNTER — Other Ambulatory Visit (HOSPITAL_COMMUNITY): Payer: Self-pay | Admitting: Cardiology

## 2017-04-24 DIAGNOSIS — Z720 Tobacco use: Secondary | ICD-10-CM

## 2017-05-06 ENCOUNTER — Ambulatory Visit: Payer: POS

## 2017-05-10 ENCOUNTER — Ambulatory Visit: Payer: POS

## 2017-05-15 ENCOUNTER — Ambulatory Visit
Admission: RE | Admit: 2017-05-15 | Discharge: 2017-05-15 | Disposition: A | Discharge status: Home / Self Care | Payer: POS | Source: Ambulatory Visit | Attending: Cardiology | Admitting: Cardiology

## 2017-05-15 ENCOUNTER — Ambulatory Visit: Payer: POS

## 2017-05-15 DIAGNOSIS — Z720 Tobacco use: Secondary | ICD-10-CM

## 2018-04-24 ENCOUNTER — Encounter: Payer: Self-pay | Admitting: Cardiology

## 2018-04-24 ENCOUNTER — Ambulatory Visit: Payer: POS | Admitting: Cardiology

## 2018-04-24 VITALS — BP 117/69 | HR 58 | Ht 69.0 in | Wt 177.0 lb

## 2018-04-24 DIAGNOSIS — E782 Mixed hyperlipidemia: Secondary | ICD-10-CM | POA: Diagnosis not present

## 2018-04-24 DIAGNOSIS — I700 Atherosclerosis of aorta: Secondary | ICD-10-CM | POA: Diagnosis not present

## 2018-04-24 DIAGNOSIS — Z720 Tobacco use: Secondary | ICD-10-CM

## 2018-04-24 NOTE — Progress Notes (Signed)
Patient is here for follow up visit.  Subjective:   @Patient  ID: Cameron [name], male    DOB: [birth year], 68 y.o.   MRN: [id number]  Chief Complaint  Patient presents with  . Chest Pain    1 YEAR F/U   . Hyperlipidemia    HPI  68 year old Caucasian male with tobacco abuse, streak of testicular cancer in remission since 2005, hyperlipidemia, seen by me for chest pain and shortness of breath in 2019.  Workup with echocardiogram and stress test was reassuring.  He was recommended aspirin and Lipitor for hyperlipidemia.  History of a one year follow-up.  His lipid panel is remarkably improved.  He denies any chest pain or shortness of breath symptoms at this time.  Unfortunately, he continues to smoke.  He has insight, but not willing to put at this time.  Past Medical History:  Diagnosis Date  . Anxiety   . Arthritis   . BPH (benign prostatic hyperplasia)   . Depression   . GERD (gastroesophageal reflux disease)   . History of testicular cancer   . Hx of gynecomastia   . Tobacco abuse   . Varicocele     Past Surgical History:  Procedure Laterality Date  . APPENDECTOMY    . ORCHIECTOMY  2005   right  . varicocoele      Social History   Socioeconomic History  . Marital status: Married    Spouse name: Not on file  . Number of children: 2  . Years of education: Not on file  . Highest education level: Not on file  Occupational History  . Occupation: Retired    Fish farm manager: [employer]  Social Needs  . Financial resource strain: Not on file  . Food insecurity:    Worry: Not on file    Inability: Not on file  . Transportation needs:    Medical: Not on file    Non-medical: Not on file  Tobacco Use  . Smoking status: Current Every Day Smoker    Types: Cigarettes  . Smokeless tobacco: Current User    Types: Chew  . Tobacco comment: Started smoking at age 7.  Currently smoking 1 1/2 ppd. Uses chewing tobacco once weekly.   Substance and Sexual Activity  .  Alcohol use: Yes    Comment: drinks 6 beers per day  . Drug use: No  . Sexual activity: Not on file  Lifestyle  . Physical activity:    Days per week: Not on file    Minutes per session: Not on file  . Stress: Not on file  Relationships  . Social connections:    Talks on phone: Not on file    Gets together: Not on file    Attends religious service: Not on file    Active member of club or organization: Not on file    Attends meetings of clubs or organizations: Not on file    Relationship status: Not on file  . Intimate partner violence:    Fear of current or ex partner: Not on file    Emotionally abused: Not on file    Physically abused: Not on file    Forced sexual activity: Not on file  Other Topics Concern  . Not on file  Social History Narrative  . Not on file    Current Outpatient Medications on File Prior to Visit  Medication Sig Dispense Refill  . clonazePAM (KLONOPIN) 0.5 MG tablet Take 0.25 mg by mouth at bedtime.     Marland Kitchen  meloxicam (MOBIC) 7.5 MG tablet Take 7.5 mg by mouth daily.  6  . omeprazole (PRILOSEC OTC) 20 MG tablet Take 20 mg by mouth daily.    . sertraline (ZOLOFT) 50 MG tablet Take 50 mg by mouth daily.  6  . tamsulosin (FLOMAX) 0.4 MG CAPS capsule Take 0.4 mg by mouth at bedtime.  1   No current facility-administered medications on file prior to visit.     Cardiovascular studies:  EKG 04/24/2018: Sinus rhtyhm 54 bpm. Normal EKG.   Labs 07/03/2016: H/H 13.7/49.  MCV 94.  Platelets 208 Glucose 91.  BUN/creatinine 14/1.0.  EGFR 75/91.  Sodium 138, potassium 4.2 Cholesterol 204.  Triglyceride 180, HDL 32, LDL 136  Labs 07/05/2017: Glucose 90.  BUN/creatinine 13/1.1.  EGFR 67/81.  Sodium 140, potassium 3.7.  Rest of the CMP normal. H/H 14/42.  MCV 96.  Platelets 181 Chol 126, triglycerides 111, HDL 31, LDL 73  Exercise sestamibi stress test 04/19/2017:  1. The patient performed treadmill exercise using a Bruce protocol, completing 5 minutes. The  patient completed an estimated workload of 6.98 METS, reaching 92% of the maximum predicted heart rate. The patient did not develop symptoms other than fatigue during the procedure. Low exercise capacity for age. Normal heart rate and blood pressure response to exercise.  2. Stress electrocardiogram is negative for ischemia.  3. The overall quality of the study is good. There is no evidence of abnormal lung activity. Stress and rest SPECT images demonstrate homogeneous tracer distribution throughout the myocardium. Gated SPECT imaging reveals normal myocardial thickening and wall motion. The left ventricular ejection fraction was normal (71%).  4. This is a low risk study.  Echocardiogram 04/19/2017: Left ventricle cavity is normal in size. Mild concentric hypertrophy of the left ventricle. Normal global wall motion. Normal diastolic filling pattern. Calculated EF 55%. Mild tricuspid regurgitation. Estimated pulmonary artery systolic pressure 25 mmHg.  Review of Systems  Constitution: Negative for decreased appetite, malaise/fatigue, weight gain and weight loss.  HENT: Negative for congestion.   Eyes: Negative for visual disturbance.  Cardiovascular: Negative for chest pain, claudication, dyspnea on exertion, leg swelling, palpitations and syncope.  Respiratory: Negative for shortness of breath.   Endocrine: Negative for cold intolerance.  Hematologic/Lymphatic: Does not bruise/bleed easily.  Skin: Negative for itching and rash.  Musculoskeletal: Negative for myalgias.  Gastrointestinal: Negative for abdominal pain, nausea and vomiting.  Genitourinary: Negative for dysuria.  Neurological: Negative for dizziness and weakness.  Psychiatric/Behavioral: The patient is not nervous/anxious.   All other systems reviewed and are negative.      Objective:   There were no vitals filed for this visit.   Physical Exam  Constitutional: He is oriented to person, place, and time. He appears  well-developed and well-nourished. No distress.  HENT:  Head: Normocephalic and atraumatic.  Eyes: Pupils are equal, round, and reactive to light. Conjunctivae are normal.  Neck: No JVD present.  Cardiovascular: Normal rate, regular rhythm and intact distal pulses.  No murmur heard. Pulmonary/Chest: Effort normal and breath sounds normal. He has no wheezes. He has no rales.  Abdominal: Soft. Bowel sounds are normal. There is no rebound.  Musculoskeletal:        General: No edema.  Lymphadenopathy:    He has no cervical adenopathy.  Neurological: He is alert and oriented to person, place, and time. No cranial nerve deficit.  Skin: Skin is warm and dry.  Psychiatric: He has a normal mood and affect.  Nursing note and vitals reviewed.  Assessment & Recommendations:   68 year old Caucasian male with tobacco abuse, streak of testicular cancer in remission since 2005, hyperlipidemia, seen by me for chest pain and shortness of breath in 2019.  Continue aspirin and Lipitor for primary prevention.  Reemphasizes importance of tobacco cessation, patient not limited to quit at this time.   No active cardiac complaints at this time.  I will see him on an as-needed basis.  Continue follow up with PCP Dr. Osborne Casco.   Nigel Mormon, MD [facility] Pager: [phone] Office: [phone] If no answer Cell [phone]

## 2018-04-25 ENCOUNTER — Encounter: Payer: Self-pay | Admitting: Cardiology

## 2018-04-25 DIAGNOSIS — I700 Atherosclerosis of aorta: Secondary | ICD-10-CM | POA: Insufficient documentation

## 2018-04-25 DIAGNOSIS — E782 Mixed hyperlipidemia: Secondary | ICD-10-CM | POA: Insufficient documentation

## 2018-07-16 ENCOUNTER — Encounter: Payer: Self-pay | Admitting: Neurology

## 2018-07-17 ENCOUNTER — Encounter: Payer: Self-pay | Admitting: Neurology

## 2018-08-08 NOTE — Progress Notes (Signed)
AIRON [name] was seen today in the movement disorders clinic for neurologic consultation at the request of Tisovec, Cameron Him, MD.  The consultation is for the evaluation of tremor.  The records that were made available to me were reviewed.  Tremor: Yes.     How long has it been going on? 15+ years, seemed to come on quickly at the time but have gotten worse  At rest or with activation?  both  When is it noted the most?  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  Yes.    Located where?  R hand shakes more than left (he is R hand dominant)  Affected by caffeine:  No.  Affected by alcohol:  Doesn't drink EtOH now but when he was drinking, it would decrease the tremor dramatically  Affected by stress:  Yes.    Affected by fatigue:  Maybe, a little  Spills soup if on spoon:  Yes.    Spills glass of liquid if full:  Yes.  , if in the R hand  Affects ADL's (tying shoes, brushing teeth, etc):  Yes.   (can shave but may have to use 2 hands and shaves with electric)  Tremor inducing meds:  No.  Other Specific Symptoms:  Voice: no change Sleep: "I ain't never slept"  Vivid Dreams:  No.  Acting out dreams:  No. Wet Pillows: No. Postural symptoms:  No.  Falls?  No. Bradykinesia symptoms: no bradykinesia noted Loss of smell:  No. Loss of taste:  No. Urinary Incontinence:  No. Difficulty Swallowing:  No. Depression:  No. Memory changes:  No. Hallucinations:  No.  visual distortions: No. N/V:  No. Lightheaded:  No.  Syncope: No. Diplopia:  No. Dyskinesia:  No.   Last neuroimaging was in 2003.  Imaging pictures were not available.  Was noted to show chronic small vessel disease.  PREVIOUS MEDICATIONS: none to date  ALLERGIES:  No Known Allergies  CURRENT MEDICATIONS:  Outpatient Encounter Medications as of 08/11/2018  Medication Sig  . ASPIRIN 81 PO Take 1 tablet by mouth daily.  .  clonazePAM (KLONOPIN) 0.5 MG tablet Take 0.25 mg by mouth at bedtime.   . meloxicam (MOBIC) 7.5 MG tablet Take 7.5 mg by mouth daily.  Marland Kitchen omeprazole (PRILOSEC OTC) 20 MG tablet Take 20 mg by mouth daily.  . rosuvastatin (CRESTOR) 20 MG tablet Take 1 tablet by mouth daily.  . sertraline (ZOLOFT) 50 MG tablet Take 50 mg by mouth daily.  . tadalafil (CIALIS) 5 MG tablet Take 5 mg by mouth daily.   No facility-administered encounter medications on file as of 08/11/2018.     PAST MEDICAL HISTORY:   Past Medical History:  Diagnosis Date  . Anxiety   . Arthritis   . BPH (benign prostatic hyperplasia)   . Depression   . GERD (gastroesophageal reflux disease)   . History of testicular cancer   . Hx of gynecomastia   . Tobacco abuse   . Varicocele     PAST SURGICAL HISTORY:   Past Surgical History:  Procedure Laterality Date  . APPENDECTOMY    . MASTECTOMY    . ORCHIECTOMY  2005   right  . varicocoele      SOCIAL HISTORY:   Social History   Socioeconomic History  . Marital status: Married    Spouse name: Not on file  . Number of children:  2  . Years of Cameron: Not on file  . Highest Cameron level: Not on file  Occupational History  . Occupation: Retired    Fish farm manager: [employer]  Social Needs  . Financial resource strain: Not on file  . Food insecurity:    Worry: Not on file    Inability: Not on file  . Transportation needs:    Medical: Not on file    Non-medical: Not on file  Tobacco Use  . Smoking status: Current Every Day Smoker    Packs/day: 1.50    Years: 40.00    Pack years: 60.00    Types: Cigarettes  . Smokeless tobacco: Current User    Types: Chew  . Tobacco comment: Started smoking at age 28.  Currently smoking 1 1/2 ppd. Uses chewing tobacco once weekly.   Substance and Sexual Activity  . Alcohol use: Not Currently  . Drug use: No  . Sexual activity: Not on file  Lifestyle  . Physical activity:    Days per week: Not on file    Minutes per  session: Not on file  . Stress: Not on file  Relationships  . Social connections:    Talks on phone: Not on file    Gets together: Not on file    Attends religious service: Not on file    Active member of club or organization: Not on file    Attends meetings of clubs or organizations: Not on file    Relationship status: Not on file  . Intimate partner violence:    Fear of current or ex partner: Not on file    Emotionally abused: Not on file    Physically abused: Not on file    Forced sexual activity: Not on file  Other Topics Concern  . Not on file  Social History Narrative  . Not on file    FAMILY HISTORY:   Family Status  Relation Name Status  . Father  Deceased  . Mother  Deceased  . Brother  Deceased  . Sister Cameron [name], age 23y  . Sister Cameron [name]  . H Sister  Alive       share same father  . Son Cameron [name]  . Daughter Cameron [name]  . Sister two half sisters Alive    ROS:  Review of Systems  Constitutional: Negative.   HENT: Negative.   Eyes: Negative.   Respiratory: Negative.   Cardiovascular: Negative.   Gastrointestinal: Negative.   Genitourinary: Positive for frequency.  Skin: Negative.   Endo/Heme/Allergies: Negative.   Psychiatric/Behavioral: Negative.     PHYSICAL EXAMINATION:    VITALS:   Vitals:   08/11/18 1255  BP: (!) 146/68  Pulse: 67  Temp: 98.6 F (37 C)  SpO2: 96%  Weight: 174 lb 6.4 oz (79.1 kg)  Height: 5\' 9"  (1.753 m)    GEN:  The patient appears stated age and is in NAD. HEENT:  Normocephalic, atraumatic.  The mucous membranes are moist. The superficial temporal arteries are without ropiness or tenderness. CV:  RRR Lungs:  CTAB Neck/HEME:  There are no carotid bruits bilaterally.  Neurological examination:  Orientation: The patient is alert and oriented x3. Fund of knowledge is appropriate.  Recent and remote memory are intact.  Attention and concentration are normal.    Able to name objects and repeat phrases.  Cranial nerves: There is good facial symmetry. Pupils are equal round and reactive to light bilaterally. Fundoscopic exam reveals clear margins bilaterally. Extraocular muscles are  intact. The visual fields are full to confrontational testing. The speech is fluent and clear. Soft palate rises symmetrically and there is no tongue deviation. Hearing is intact to conversational tone. Sensation: Sensation is intact to light and pinprick throughout (facial, trunk, extremities). Vibration is intact at the bilateral big toe. There is no extinction with double simultaneous stimulation. There is no sensory dermatomal level identified. Motor: Strength is 5/5 in the bilateral upper and lower extremities.   Shoulder shrug is equal and symmetric.  There is no pronator drift. Deep tendon reflexes: Deep tendon reflexes are 2/4 at the bilateral biceps, triceps, brachioradialis, patella and achilles. Plantar responses are downgoing bilaterally.  Movement examination: Tone: There is normal tone in the bilateral upper extremities.  The tone in the lower extremities is normal.  Abnormal movements: There is no rest tremor.  There is mod to severe postural tremor on the right.  There is mild postural tremor on the L, worse with a weight but still mild.  he has difficulty with archimedes spirals.  he has difficulty when asked to pour a full glass of water from one glass to another.  Coordination:  There is no decremation with RAM's, with any form of RAMS, including alternating supination and pronation of the forearm, hand opening and closing, finger taps, heel taps and toe taps. Gait and Station: The patient has no difficulty arising out of a deep-seated chair without the use of the hands. The patient's stride length is good with good arm swing.  He ambulates with mild difficulty in a tandem fashion  Labs: Lab work is reviewed and dated Jul 08, 2018.  White blood cells are 5.4, hemoglobin 14.2, and platelets 178.  Sodium was  139, potassium 4.1, chloride 105, CO2 25, BUN 13, creatinine 1.0, glucose 99, AST 24, ALT 29 and alkaline phosphatase 56.  ASSESSMENT/PLAN:  1.  Essential Tremor.  -his is asymmetric which happens 30% of the time.   We discussed nature and pathophysiology.  We discussed that this can continue to gradually get worse with time.  We discussed that some medications can worsen this, as can caffeine use.  We discussed medication therapy as well as surgical therapy.  We discussed that his likely won't be controlled with med given degree of tremor.  Ultimately, the patient decided to try primidone, 50 mg, and work to 1 po bid.  Risks, benefits, side effects and alternative therapies were discussed.  The opportunity to ask questions was given and they were answered to the best of my ability.  The patient expressed understanding and willingness to follow the outlined treatment protocols.  -called PCP prior to visit and staff reported TSH has never been done.  Will do today  -pt Cameron video on DBS given  -pt shown HIPAA compliant video on pt who has had DBS in our clinic.  Pt asked questions and answered to the best of my ability  2.  Tobacco abuse  -Currently smoking 1.5 packs/day    - Patient was informed of the dangers of tobacco abuse including stroke, cancer, and MI, as well as benefits of tobacco cessation.  - Patient doesn't want to quit but interested in DBS.  Understands that we won't do DBS while smoking as impedes healing and he is going to try to cut back.  He states that he is "mean" when trying to quit.  Discussed meds to help that and told [name] to f/u with PCP  - Approximately 11 mins were spent counseling patient cessation  techniques. We discussed various methods to help quit smoking, including deciding on a date to quit, joining a support group, pharmacological agents- nicotine gum/patch/lozenges, chantix,   - I will reassess his progress at future visit.   3.  Follow up is anticipated in  the next 5 months, sooner should new neurologic issues arise.  Much greater than 50% of this visit was spent in counseling and coordinating care.  Total face to face time:  60 min not including time for smoking cessation talk   Cc:  Tisovec, Cameron Him, MD

## 2018-08-11 ENCOUNTER — Telehealth: Payer: Self-pay | Admitting: Neurology

## 2018-08-11 ENCOUNTER — Encounter: Payer: Self-pay | Admitting: Neurology

## 2018-08-11 ENCOUNTER — Ambulatory Visit: Payer: POS | Admitting: Neurology

## 2018-08-11 ENCOUNTER — Other Ambulatory Visit: Payer: Self-pay

## 2018-08-11 ENCOUNTER — Other Ambulatory Visit (INDEPENDENT_AMBULATORY_CARE_PROVIDER_SITE_OTHER): Payer: POS

## 2018-08-11 VITALS — BP 146/68 | HR 67 | Temp 98.6°F | Ht 69.0 in | Wt 174.4 lb

## 2018-08-11 DIAGNOSIS — R251 Tremor, unspecified: Secondary | ICD-10-CM | POA: Diagnosis not present

## 2018-08-11 DIAGNOSIS — Z720 Tobacco use: Secondary | ICD-10-CM | POA: Diagnosis not present

## 2018-08-11 DIAGNOSIS — G250 Essential tremor: Secondary | ICD-10-CM | POA: Diagnosis not present

## 2018-08-11 LAB — TSH: TSH: 2.36 mIU/L (ref 0.40–4.50)

## 2018-08-11 MED ORDER — PRIMIDONE 50 MG PO TABS: 50.0000 mg | ORAL_TABLET | Freq: Two times a day (BID) | ORAL | 1 refills | Status: DC

## 2018-08-11 NOTE — Telephone Encounter (Signed)
Yes I called patient PCP office requesting lab results "TSH" last week. Tat, Eustace Quail, DO  Ranae Plumber, CMA        I have may labs from PCP but no TSH. Will you ask them to send last TSH completed please     ~Dr. Tat  See below

## 2018-08-11 NOTE — Patient Instructions (Signed)
1.  Start primidone - 50 mg - 1/2 tablet at night for 4 nights and then increase to 1 tablet at night for 2 weeks and then increase to 1 tablet twice per day as long as you tolerate it.

## 2018-08-11 NOTE — Telephone Encounter (Signed)
Gail left msg with after hours about returning a call to the office about patient's labs. Something about you were looking for TSH labs on patient and they don't have them. Thanks!

## 2018-08-12 ENCOUNTER — Telehealth: Payer: Self-pay

## 2018-08-12 NOTE — Telephone Encounter (Signed)
Called patient no answer left message labs normal

## 2018-08-12 NOTE — Telephone Encounter (Signed)
-----   Message from [place], DO sent at 08/12/2018  8:09 AM EDT ----- I have reviewed pts TSH and it is nl.   Please inform the patient.

## 2018-08-15 ENCOUNTER — Telehealth: Payer: Self-pay | Admitting: Neurology

## 2018-08-15 NOTE — Telephone Encounter (Signed)
I agree with him.  Give it 2 weeks and try one more time, but this time split the really small pill in 1/4 if he can and start with 1/4 of the pill for 4 nights and then go to 1/2 for 4 nights and see if he tolerates it.  If not, call me back

## 2018-08-15 NOTE — Telephone Encounter (Signed)
See below. Please advise.  

## 2018-08-15 NOTE — Telephone Encounter (Signed)
That is not a side effect I have ever had with this medication.  Did he start with 1/2 tablets as instructed?  If so, he should still be just on a 1/2 tablet which is lower than the smallest dose manufactured so this would be very unusual

## 2018-08-15 NOTE — Telephone Encounter (Signed)
Called spoke with patient he states that yes he is taken 1/2 tablet as instructed. He couldn't stand but two days of taken medication. He is no longer haven the pressure in head. But not a big deal he says.   He only took (2) 1/2 tablets 1/2 on Monday night and 1/2 on Tuesday night he haven't had any since.  He would like to know if you would like him to try it again?  If so he would like to wait about 2 weeks before starting due to helping his wife with his mother in law. He would like to have a clear head before using it again.

## 2018-08-15 NOTE — Telephone Encounter (Signed)
Called spoke with patient he is aware and understands

## 2018-08-15 NOTE — Telephone Encounter (Signed)
Pt called to inform that primidone is giving him bad side effects, he states that the pressure that he feels on his head is not tolerable, he would like to know if there is something else that he could try. Pls call him.

## 2018-12-24 ENCOUNTER — Other Ambulatory Visit: Payer: Self-pay | Admitting: Cardiology

## 2018-12-24 DIAGNOSIS — R0789 Other chest pain: Secondary | ICD-10-CM

## 2018-12-24 DIAGNOSIS — E782 Mixed hyperlipidemia: Secondary | ICD-10-CM

## 2019-01-15 ENCOUNTER — Encounter: Payer: Self-pay | Admitting: Neurology

## 2019-01-15 NOTE — Progress Notes (Signed)
    Virtual Visit via Telephone Note The purpose of this virtual visit is to provide medical care while limiting exposure to the novel coronavirus.    Consent was obtained for phone visit:  Yes.   Answered questions that patient had about telehealth interaction:  Yes.   I discussed the limitations, risks, security and privacy concerns of performing an evaluation and management service by telephone. I also discussed with the patient that there may be a patient responsible charge related to this service. The patient expressed understanding and agreed to proceed.  Pt location: Home Physician Location: home Name of referring provider:  Tisovec, Fransico Him, MD I connected with .Cameron [name] at patients initiation/request on 01/16/2019 at  2:30 PM EST by telephone and verified that I am speaking with the correct person using two identifiers.  Pt MRN:  [id number] Pt DOB:  1950-09-21   History of Present Illness:  Patient seen today in follow-up for essential tremor.  He was started on primidone last visit.  I gave him instructions to work up to 50 mg twice per day.  He initially reported that he had head pressure with medication, so we restarted it over quite slowly.  I did not hear from him after that.  He restarted the medication and "it did fine that time."  He reports today that "tremor is a little better with the primidone."  Estimates tremor is about 20% better.  Took the flu shot yesterday and "I have body aches."  Is still smoking and "I have to be ready and I am not ready."   Observations/Objective:   There were no vitals filed for this visit.  Lab Results  Component Value Date   TSH 2.36 08/11/2018    Assessment and Plan:   1.  Essential Tremor.             -increase primidone, 50 mg, 1 tablet in the morning and 2 tablets at night for a week and then increase to 2 tablets twice per day.  Reminded him that I had told him before that this likely will not get rid of tremor altogether.   Discussed DBS again, but also reminded him that we would not do DBS without him discontinuing tobacco.  2.  Tobacco abuse             -Talked about smoking cessation.  Patient is not ready.  Follow Up Instructions:  5 months  -I discussed the assessment and treatment plan with the patient. The patient was provided an opportunity to ask questions and all were answered. The patient agreed with the plan and demonstrated an understanding of the instructions.   The patient was advised to call back or seek an in-person evaluation if the symptoms worsen or if the condition fails to improve as anticipated.    Total Time spent in visit with the patient was:  12 min, of which 100% of the time was spent in counseling.   Pt understands and agrees with the plan of care outlined.     Alonza Bogus, DO

## 2019-01-16 ENCOUNTER — Other Ambulatory Visit: Payer: Self-pay

## 2019-01-16 ENCOUNTER — Telehealth (INDEPENDENT_AMBULATORY_CARE_PROVIDER_SITE_OTHER): Payer: POS | Admitting: Neurology

## 2019-01-16 DIAGNOSIS — Z720 Tobacco use: Secondary | ICD-10-CM | POA: Diagnosis not present

## 2019-01-16 DIAGNOSIS — G250 Essential tremor: Secondary | ICD-10-CM | POA: Diagnosis not present

## 2019-01-16 MED ORDER — PRIMIDONE 50 MG PO TABS: 100.0000 mg | ORAL_TABLET | Freq: Two times a day (BID) | ORAL | 1 refills | Status: DC

## 2019-06-14 IMAGING — CT CT CHEST LUNG CANCER SCREENING LOW DOSE W/O CM
1 of 2 series · 10 of 40 positions shown, 13 images · non-contrast
Comparison: CT chest dated 03/25/2008

CLINICAL DATA: 66-year-old male current smoker, with 60 pack-year
history of smoking, for initial lung cancer screening. History of
testicular cancer diagnosed 2994.

EXAM:
CT CHEST WITHOUT CONTRAST LOW-DOSE FOR LUNG CANCER SCREENING
TECHNIQUE: Multidetector CT imaging of the chest was performed following the
standard protocol without IV contrast.

[ct lung segmentation data · axial · 0.72mm/px · z∈[-392,-392]mm · 10 of 343 frames shown]
[frame 1/343  mediastinal]
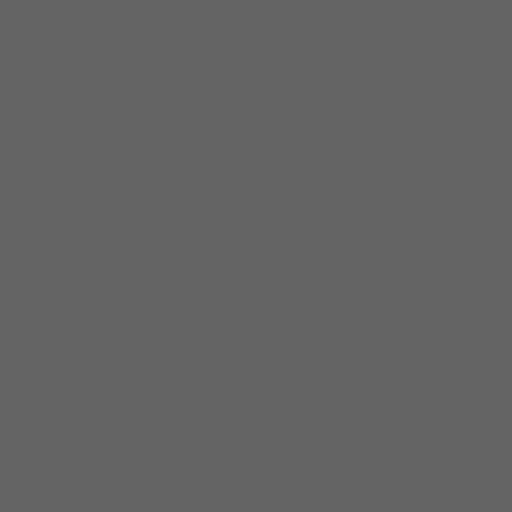
[frame 1/343  lung]
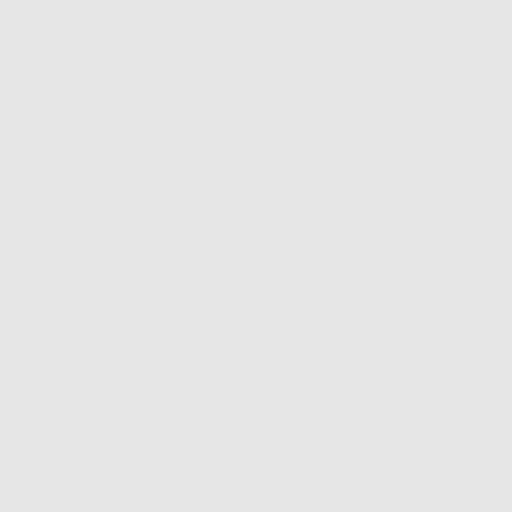
[frame 39/343  lung]
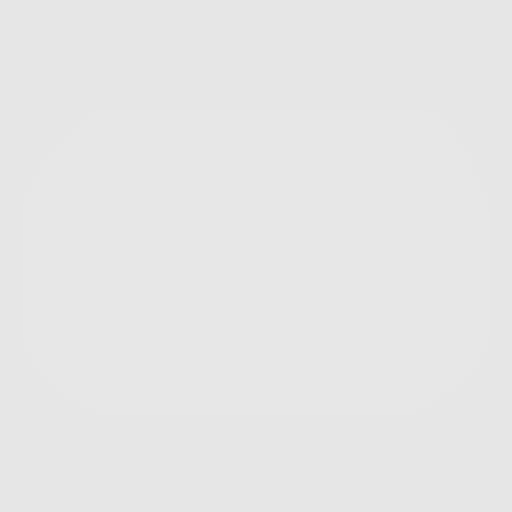
[frame 77/343  lung]
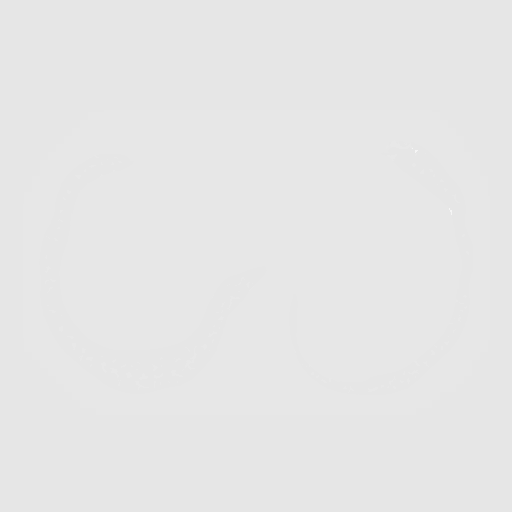
[frame 115/343  lung]
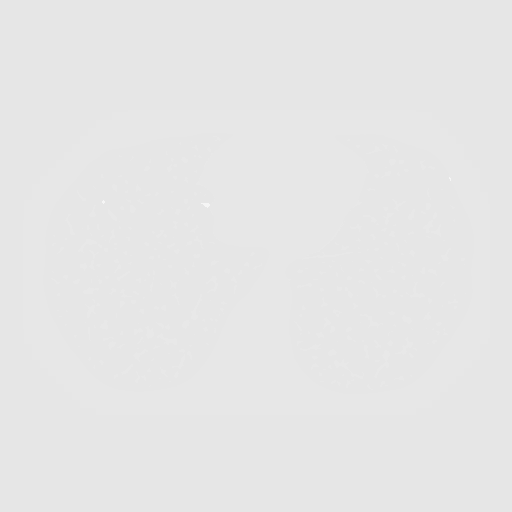
[frame 153/343  mediastinal]
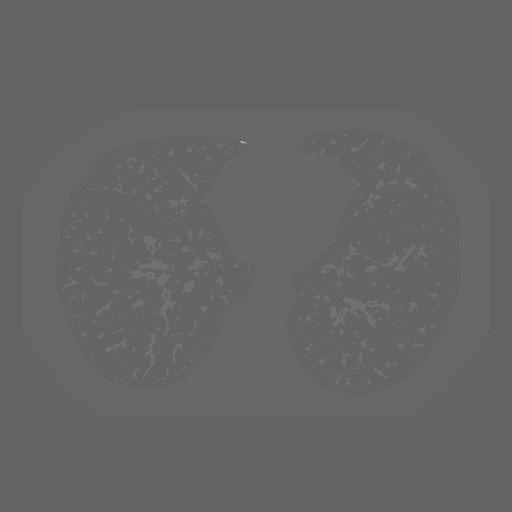
[frame 153/343  lung]
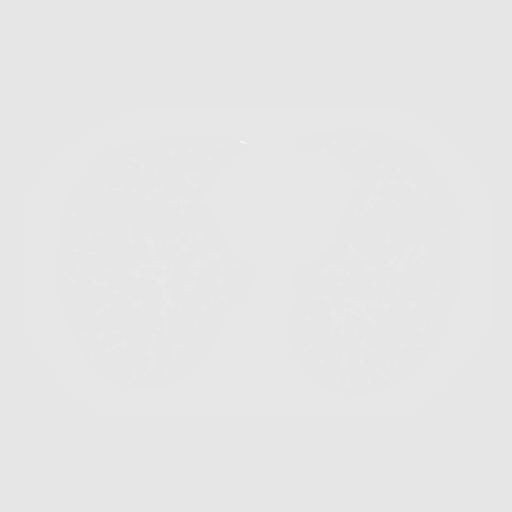
[frame 191/343  lung]
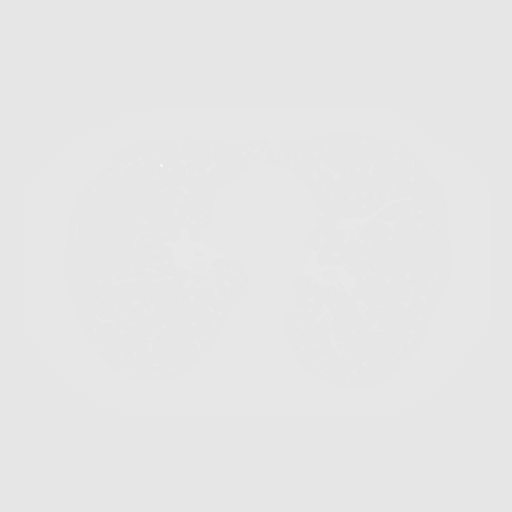
[frame 229/343  lung]
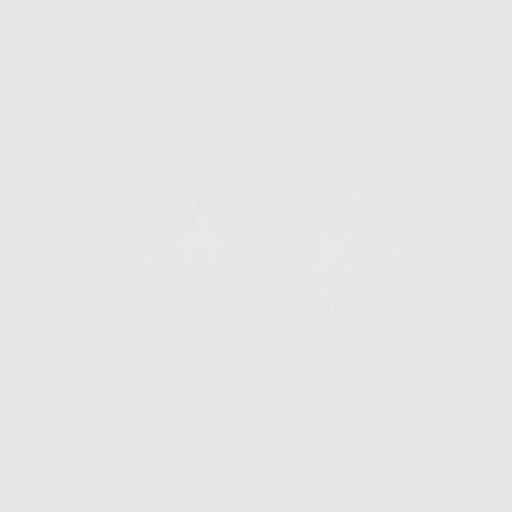
[frame 267/343  lung]
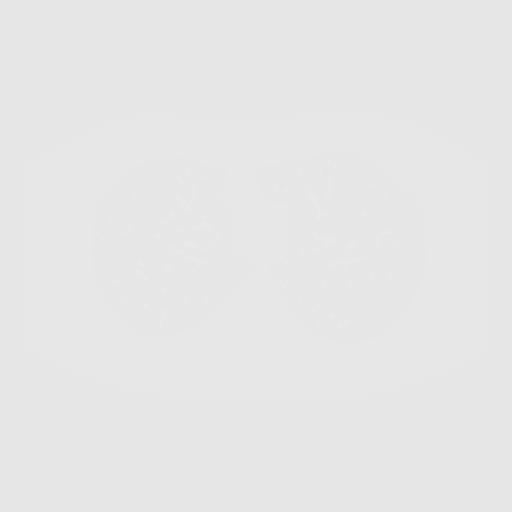
[frame 305/343  mediastinal]
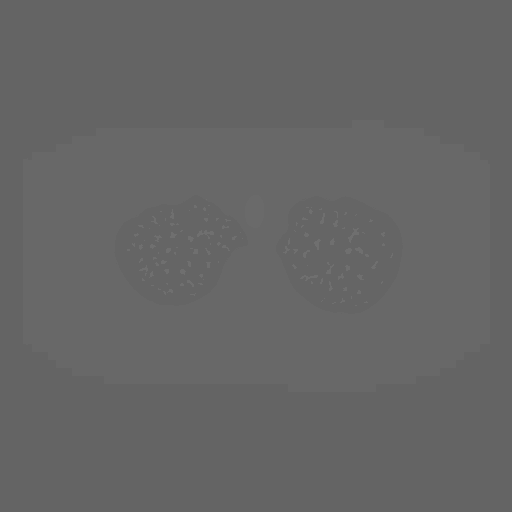
[frame 305/343  lung]
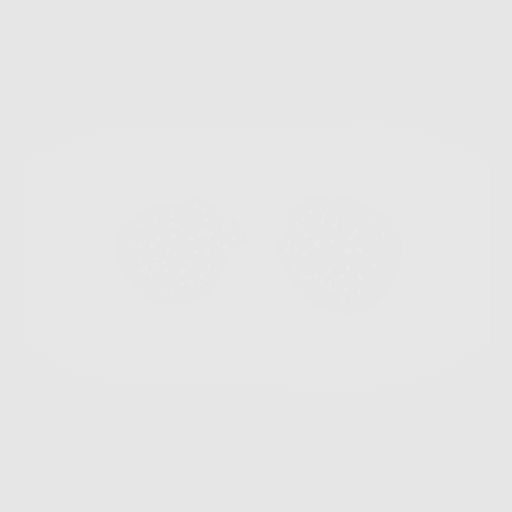
[frame 343/343  lung]
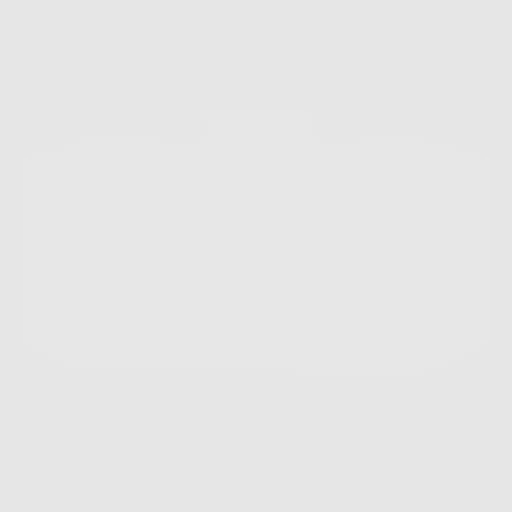

[10 of 40 positions shown; findings below may reference images not displayed]

FINDINGS: Cardiovascular: Heart is normal in size.  No pericardial effusion.

No evidence of thoracic aortic aneurysm. Mild atherosclerotic
calcifications of the aortic arch.

Coronary atherosclerosis the LAD.

Mediastinum/Nodes: No suspicious mediastinal lymphadenopathy.

Visualized thyroid is unremarkable.

Lungs/Pleura: Scattered calcified granulomata, predominantly in the
right upper and middle lobes, benign.

No focal consolidation.

Mild centrilobular emphysematous changes, upper lobe predominant.

No pleural effusion or pneumothorax.

Upper Abdomen: Visualized upper abdomen is grossly unremarkable.

Musculoskeletal: Visualized osseous structures are within normal
limits.
IMPRESSION: Lung-RADS 1, negative. Continue annual screening with low-dose chest
CT without contrast in 12 months.

Aortic Atherosclerosis (B2JIS-FLN.N) and Emphysema (B2JIS-I63.N).

## 2019-06-26 NOTE — Progress Notes (Deleted)
Assessment/Plan:    1.  Essential Tremor  ***-Primidone, 50 mg,***  -Patient and I discussed and have discussed previously DBS therapy.  However, we would not be able to do this while patient still smoking.  2.  Tobacco abuse ` -Patient not ready for smoking cessation.    Subjective:   Cameron [name] was seen today in follow up for essential tremor.  My previous records were reviewed prior to todays visit.  His primidone was increased last visit.  Reports that ***.  pt denies falls.  Pt denies lightheadedness, near syncope.  No hallucinations.  Mood has been good.  Current prescribed movement disorder medications: ***Primidone, 50 mg, 2 tablets twice per day (increased last visit)   PREVIOUS MEDICATIONS: {Parkinson's RX:18200}  ALLERGIES:  No Known Allergies  CURRENT MEDICATIONS:  Outpatient Encounter Medications as of 06/30/2019  Medication Sig  . ASPIRIN 81 PO Take 1 tablet by mouth daily.  . ASPIRIN LOW DOSE 81 MG chewable tablet TAKE 1 TABLET BY MOUTH EVERY DAY  . clonazePAM (KLONOPIN) 0.5 MG tablet Take 0.25 mg by mouth at bedtime.   . meloxicam (MOBIC) 7.5 MG tablet Take 7.5 mg by mouth daily.  Marland Kitchen omeprazole (PRILOSEC OTC) 20 MG tablet Take 20 mg by mouth daily.  . primidone (MYSOLINE) 50 MG tablet Take 2 tablets (100 mg total) by mouth 2 (two) times daily.  . rosuvastatin (CRESTOR) 20 MG tablet TAKE 1 TABLET BY MOUTH EVERY DAY  . sertraline (ZOLOFT) 50 MG tablet Take 50 mg by mouth daily.  . tadalafil (CIALIS) 5 MG tablet Take 5 mg by mouth daily.   No facility-administered encounter medications on file as of 06/30/2019.     Objective:    PHYSICAL EXAMINATION:    VITALS:  There were no vitals filed for this visit.  GEN:  The patient appears stated age and is in NAD. HEENT:  Normocephalic, atraumatic.  The mucous membranes are moist. The superficial temporal arteries are without ropiness or tenderness. CV:  RRR Lungs:  CTAB Neck/HEME:  There are no  carotid bruits bilaterally.  Neurological examination:  Orientation: The patient is alert and oriented x3. Cranial nerves: There is good facial symmetry. The speech is fluent and clear. Soft palate rises symmetrically and there is no tongue deviation. Hearing is intact to conversational tone. Sensation: Sensation is intact to light touch throughout Motor: Strength is at least antigravity x4.  Movement examination: Tone: There is normal tone in the UE/LE Abnormal movements: *** Coordination:  There is *** decremation with RAM's, *** Gait and Station: The patient has *** difficulty arising out of a deep-seated chair without the use of the hands. The patient's stride length is good I have reviewed and interpreted the following labs independently   Chemistry      Component Value Date/Time   NA 139 03/22/2017 1632   K 4.1 03/22/2017 1632   CL 106 03/22/2017 1632   CO2 26 03/22/2017 1632   BUN 8 03/22/2017 1632   CREATININE 0.99 03/22/2017 1632      Component Value Date/Time   CALCIUM 8.9 03/22/2017 1632      Lab Results  Component Value Date   WBC 6.2 03/22/2017   HGB 14.3 03/22/2017   HCT 42.1 03/22/2017   MCV 93.6 03/22/2017   PLT 202 03/22/2017   Lab Results  Component Value Date   TSH 2.36 08/11/2018     Chemistry      Component Value Date/Time   NA 139 03/22/2017  1632   K 4.1 03/22/2017 1632   CL 106 03/22/2017 1632   CO2 26 03/22/2017 1632   BUN 8 03/22/2017 1632   CREATININE 0.99 03/22/2017 1632      Component Value Date/Time   CALCIUM 8.9 03/22/2017 1632         Total time spent on today's visit was ***30 minutes, including both face-to-face time and nonface-to-face time.  Time included that spent on review of records (prior notes available to me/labs/imaging if pertinent), discussing treatment and goals, answering patient's questions and coordinating care.  Cc:  Tisovec, Fransico Him, MD

## 2019-06-30 ENCOUNTER — Ambulatory Visit: Payer: POS | Admitting: Neurology

## 2019-07-23 NOTE — Progress Notes (Signed)
Assessment/Plan:    1.  Essential Tremor  -Slowly increase-Primidone, 50 mg, 3 tablets twice per day  -Discussed DBS and focused ultrasound in depth today.  However, patient is not ready to quit smoking and we would not do these procedures while he is smoking.  Patient understands.  2.  Tobacco abuse  -Currently smoking 1 packs/day    - Patient was informed of the dangers of tobacco abuse including stroke, cancer, and MI, as well as benefits of tobacco cessation.  - Patient not willing to quit at this time.  - Approximately 5 mins were spent counseling patient cessation techniques. We discussed various methods to help quit smoking, including deciding on a date to quit, joining a support group, pharmacological agents- nicotine gum/patch/lozenges, chantix,   - I will reassess his progress at future visit.     Subjective:   Cameron [name] was seen today in follow up for essential tremor.  My previous records were reviewed prior to todays visit.  His primidone was increased last visit.  Reports that tremor is "terrible."  pt denies falls.  Pt denies lightheadedness, near syncope.  No hallucinations.  Mood has been good.  Current prescribed movement disorder medications: Primidone, 50 mg, 2 tablets twice per day (increased last visit)     ALLERGIES:  No Known Allergies  CURRENT MEDICATIONS:  Outpatient Encounter Medications as of 07/24/2019  Medication Sig  . ASPIRIN 81 PO Take 1 tablet by mouth daily.  . clonazePAM (KLONOPIN) 0.5 MG tablet Take 0.25 mg by mouth at bedtime.   . meloxicam (MOBIC) 7.5 MG tablet Take 7.5 mg by mouth daily.  . Multiple Vitamin (MULTIVITAMIN ADULT) TABS Take 1 tablet by mouth daily.  Marland Kitchen omeprazole (PRILOSEC OTC) 20 MG tablet Take 20 mg by mouth daily.  . primidone (MYSOLINE) 50 MG tablet TAKE 2 TABLETS (100 MG TOTAL) BY MOUTH 2 (TWO) TIMES DAILY.  . rosuvastatin (CRESTOR) 20 MG tablet TAKE 1 TABLET BY MOUTH EVERY DAY  . sertraline (ZOLOFT) 50 MG  tablet Take 50 mg by mouth daily.  . tadalafil (CIALIS) 5 MG tablet Take 5 mg by mouth daily.  . [DISCONTINUED] ASPIRIN LOW DOSE 81 MG chewable tablet TAKE 1 TABLET BY MOUTH EVERY DAY (Patient not taking: Reported on 07/24/2019)  . [DISCONTINUED] primidone (MYSOLINE) 50 MG tablet Take 2 tablets (100 mg total) by mouth 2 (two) times daily.   No facility-administered encounter medications on file as of 07/24/2019.     Objective:    PHYSICAL EXAMINATION:    VITALS:   Vitals:   07/24/19 1111  BP: 123/67  Pulse: 68  SpO2: 97%  Weight: 177 lb (80.3 kg)  Height: 5\' 9"  (1.753 m)    GEN:  The patient appears stated age and is in NAD. HEENT:  Normocephalic, atraumatic.  The mucous membranes are moist. The superficial temporal arteries are without ropiness or tenderness. CV:  RRR Lungs:  CTAB Neck/HEME:  There are no carotid bruits bilaterally.  Neurological examination:  Orientation: The patient is alert and oriented x3. Cranial nerves: There is good facial symmetry. The speech is fluent and clear. Soft palate rises symmetrically and there is no tongue deviation. Hearing is intact to conversational tone. Sensation: Sensation is intact to light touch throughout Motor: Strength is at least antigravity x4.  Movement examination: Tone: There is normal tone in the UE/LE Abnormal movements: No rest tremor.  Moderate postural and intention tremor on the right.  Mild on the left.  Significant, but  slightly improved, Archimedes spirals on the right.  Mild trouble on the left. Coordination:  There is no decremation with RAM's Gait and Station: The patient has no difficulty arising out of a deep-seated chair without the use of the hands. The patient's stride length is good I have reviewed and interpreted the following labs independently   Chemistry      Component Value Date/Time   NA 139 03/22/2017 1632   K 4.1 03/22/2017 1632   CL 106 03/22/2017 1632   CO2 26 03/22/2017 1632   BUN 8  03/22/2017 1632   CREATININE 0.99 03/22/2017 1632      Component Value Date/Time   CALCIUM 8.9 03/22/2017 1632      Lab Results  Component Value Date   WBC 6.2 03/22/2017   HGB 14.3 03/22/2017   HCT 42.1 03/22/2017   MCV 93.6 03/22/2017   PLT 202 03/22/2017   Lab Results  Component Value Date   TSH 2.36 08/11/2018     Chemistry      Component Value Date/Time   NA 139 03/22/2017 1632   K 4.1 03/22/2017 1632   CL 106 03/22/2017 1632   CO2 26 03/22/2017 1632   BUN 8 03/22/2017 1632   CREATININE 0.99 03/22/2017 1632      Component Value Date/Time   CALCIUM 8.9 03/22/2017 1632         Total time spent on today's visit was 35 minutes, including both face-to-face time and nonface-to-face time.  Time included that spent on review of records (prior notes available to me/labs/imaging if pertinent), discussing treatment and goals, answering patient's questions and coordinating care.  Cc:  Tisovec, Fransico Him, MD

## 2019-07-24 ENCOUNTER — Other Ambulatory Visit: Payer: Self-pay | Admitting: Neurology

## 2019-07-24 ENCOUNTER — Other Ambulatory Visit: Payer: Self-pay

## 2019-07-24 ENCOUNTER — Ambulatory Visit (INDEPENDENT_AMBULATORY_CARE_PROVIDER_SITE_OTHER): Payer: POS | Admitting: Neurology

## 2019-07-24 ENCOUNTER — Encounter: Payer: Self-pay | Admitting: Neurology

## 2019-07-24 VITALS — BP 123/67 | HR 68 | Ht 69.0 in | Wt 177.0 lb

## 2019-07-24 DIAGNOSIS — Z720 Tobacco use: Secondary | ICD-10-CM

## 2019-07-24 DIAGNOSIS — G250 Essential tremor: Secondary | ICD-10-CM | POA: Diagnosis not present

## 2019-07-24 MED ORDER — PRIMIDONE 50 MG PO TABS: 150.0000 mg | ORAL_TABLET | Freq: Two times a day (BID) | ORAL | 1 refills | Status: DC

## 2019-07-24 NOTE — Patient Instructions (Signed)
Increase primidone, 3 tablets in the AM, 2 at night for a week and then increase to 3 tablets twice a day thereafter  The physicians and staff at [facility] are committed to providing excellent care. You may receive a survey requesting feedback about your experience at our office. We strive to receive "very good" responses to the survey questions. If you feel that your experience would prevent you from giving the office a "very good " response, please contact our office to try to remedy the situation. We may be reached at [phone]. Thank you for taking the time out of your busy day to complete the survey.

## 2019-11-25 ENCOUNTER — Telehealth: Payer: Self-pay | Admitting: Neurology

## 2019-11-25 NOTE — Telephone Encounter (Addendum)
Erroneous encounter

## 2019-11-25 NOTE — Telephone Encounter (Signed)
For your review

## 2019-12-23 ENCOUNTER — Other Ambulatory Visit: Payer: Self-pay | Admitting: Cardiology

## 2019-12-23 DIAGNOSIS — R0789 Other chest pain: Secondary | ICD-10-CM

## 2019-12-23 DIAGNOSIS — E782 Mixed hyperlipidemia: Secondary | ICD-10-CM

## 2020-01-03 ENCOUNTER — Other Ambulatory Visit: Payer: Self-pay | Admitting: Cardiology

## 2020-01-03 DIAGNOSIS — E782 Mixed hyperlipidemia: Secondary | ICD-10-CM

## 2020-01-19 ENCOUNTER — Other Ambulatory Visit: Payer: Self-pay | Admitting: Neurology

## 2020-01-19 NOTE — Telephone Encounter (Signed)
Rx(s) sent to pharmacy electronically.  

## 2020-01-21 NOTE — Progress Notes (Signed)
Assessment/Plan:    1.  Essential Tremor  -Continue primidone, 50 mg, 3 tablets twice per day.    -discussed propranolol.  There is data that these work synergistically.  I told him we will need to watch BP closely.    -Discussed again in detail with him surgical interventions, but patient not ready to quit smoking and we would not do the procedures while smoking due to poor wound healing.  Subjective:   Cameron [name] was seen today in follow up for essential tremor.  My previous records were reviewed prior to todays visit. Tremor about the same.  Still smoking.  Pt denies falls.  Pt denies lightheadedness, near syncope.  No hallucinations.  Mood has been good.  Current prescribed movement disorder medications: Primidone, 50 mg, 3 tablets twice per day (increased last visit from 2 tablets twice per day)     ALLERGIES:  No Known Allergies  CURRENT MEDICATIONS:  Outpatient Encounter Medications as of 01/25/2020  Medication Sig  . ASPIRIN 81 PO Take 1 tablet by mouth daily.  . Cholecalciferol (VITAMIN D-3) 125 MCG (5000 UT) TABS Take 1 tablet by mouth daily.  . clonazePAM (KLONOPIN) 0.5 MG tablet Take 0.25 mg by mouth at bedtime.   . Coenzyme Q10 (CO Q 10 PO) Take 1 tablet by mouth daily.  . meloxicam (MOBIC) 7.5 MG tablet Take 7.5 mg by mouth daily.  . Multiple Vitamin (MULTIVITAMIN ADULT) TABS Take 1 tablet by mouth daily.  Marland Kitchen omeprazole (PRILOSEC OTC) 20 MG tablet Take 20 mg by mouth daily.  . primidone (MYSOLINE) 50 MG tablet TAKE 3 TABLETS (150 MG TOTAL) BY MOUTH 2 (TWO) TIMES DAILY.  . rosuvastatin (CRESTOR) 20 MG tablet TAKE 1 TABLET BY MOUTH EVERY DAY  . sertraline (ZOLOFT) 50 MG tablet Take 50 mg by mouth daily.  . tadalafil (CIALIS) 5 MG tablet Take 5 mg by mouth daily.   No facility-administered encounter medications on file as of 01/25/2020.     Objective:    PHYSICAL EXAMINATION:    VITALS:   Vitals:   01/25/20 1112  BP: 138/72  Pulse: 82  SpO2: 97%    Weight: 176 lb (79.8 kg)  Height: 5\' 9"  (1.753 m)    GEN:  The patient appears stated age and is in NAD. HEENT:  Normocephalic, atraumatic.  The mucous membranes are moist. The superficial temporal arteries are without ropiness or tenderness. CV:  RRR Lungs:  CTAB Neck/HEME:  There are no carotid bruits bilaterally.  Neurological examination:  Orientation: The patient is alert and oriented x3. Cranial nerves: There is good facial symmetry. The speech is fluent and clear. Soft palate rises symmetrically and there is no tongue deviation. Hearing is intact to conversational tone. Sensation: Sensation is intact to light touch throughout Motor: Strength is at least antigravity x4.  Movement examination: Tone: There is normal tone in the UE/LE Abnormal movements: there is RUE postural tremor.  When writing with the R hand, tremor is noted with L hand. Coordination:  There is no decremation with RAM's Gait and Station: The patient has no  difficulty arising out of a deep-seated chair without the use of the hands. The patient's stride length is good I have reviewed and interpreted the following labs independently   Chemistry      Component Value Date/Time   NA 139 03/22/2017 1632   K 4.1 03/22/2017 1632   CL 106 03/22/2017 1632   CO2 26 03/22/2017 1632   BUN 8 03/22/2017  1632   CREATININE 0.99 03/22/2017 1632      Component Value Date/Time   CALCIUM 8.9 03/22/2017 1632      Lab Results  Component Value Date   WBC 6.2 03/22/2017   HGB 14.3 03/22/2017   HCT 42.1 03/22/2017   MCV 93.6 03/22/2017   PLT 202 03/22/2017   Lab Results  Component Value Date   TSH 2.36 08/11/2018     Chemistry      Component Value Date/Time   NA 139 03/22/2017 1632   K 4.1 03/22/2017 1632   CL 106 03/22/2017 1632   CO2 26 03/22/2017 1632   BUN 8 03/22/2017 1632   CREATININE 0.99 03/22/2017 1632      Component Value Date/Time   CALCIUM 8.9 03/22/2017 1632         Total time spent on  today's visit was 25 minutes, including both face-to-face time and nonface-to-face time.  Time included that spent on review of records (prior notes available to me/labs/imaging if pertinent), discussing treatment and goals, answering patient's questions and coordinating care.  Cc:  Tisovec, Fransico Him, MD

## 2020-01-25 ENCOUNTER — Ambulatory Visit: Payer: POS | Admitting: Neurology

## 2020-01-25 ENCOUNTER — Encounter: Payer: Self-pay | Admitting: Neurology

## 2020-01-25 ENCOUNTER — Other Ambulatory Visit: Payer: Self-pay

## 2020-01-25 VITALS — BP 138/72 | HR 82 | Ht 69.0 in | Wt 176.0 lb

## 2020-01-25 DIAGNOSIS — G20 Parkinson's disease: Secondary | ICD-10-CM

## 2020-01-25 DIAGNOSIS — G250 Essential tremor: Secondary | ICD-10-CM | POA: Diagnosis not present

## 2020-01-25 MED ORDER — PROPRANOLOL HCL 20 MG PO TABS: 20.0000 mg | ORAL_TABLET | Freq: Two times a day (BID) | ORAL | 1 refills | Status: DC

## 2020-01-25 NOTE — Patient Instructions (Addendum)
1.  Take propranolol - 20 mg - 1 in the AM for 1 week and as long as you don't feel lightheaded/dizzy you can increase your medication to propranolol 20 mg twice per day in 2 weeks 2.  Continue primidone 3 tablets twice per day for now  The physicians and staff at [facility] are committed to providing excellent care. You may receive a survey requesting feedback about your experience at our office. We strive to receive "very good" responses to the survey questions. If you feel that your experience would prevent you from giving the office a "very good " response, please contact our office to try to remedy the situation. We may be reached at [phone]. Thank you for taking the time out of your busy day to complete the survey.

## 2020-01-25 NOTE — Addendum Note (Signed)
Addended by: Ulice Brilliant T on: 01/25/2020 02:05 PM   Modules accepted: Orders

## 2020-01-26 ENCOUNTER — Other Ambulatory Visit: Payer: Self-pay | Admitting: Cardiology

## 2020-01-26 DIAGNOSIS — R0789 Other chest pain: Secondary | ICD-10-CM

## 2020-02-01 ENCOUNTER — Other Ambulatory Visit: Payer: Self-pay | Admitting: Cardiology

## 2020-02-01 DIAGNOSIS — R0789 Other chest pain: Secondary | ICD-10-CM

## 2020-03-04 DIAGNOSIS — U071 COVID-19: Secondary | ICD-10-CM

## 2020-03-04 HISTORY — DX: COVID-19: U07.1

## 2020-04-16 ENCOUNTER — Other Ambulatory Visit: Payer: Self-pay | Admitting: Neurology

## 2020-05-02 ENCOUNTER — Other Ambulatory Visit: Payer: Self-pay | Admitting: Cardiology

## 2020-05-02 DIAGNOSIS — R0789 Other chest pain: Secondary | ICD-10-CM

## 2020-05-12 ENCOUNTER — Other Ambulatory Visit: Payer: Self-pay | Admitting: Urology

## 2020-06-03 ENCOUNTER — Other Ambulatory Visit: Payer: Self-pay

## 2020-06-03 ENCOUNTER — Encounter (HOSPITAL_BASED_OUTPATIENT_CLINIC_OR_DEPARTMENT_OTHER): Payer: Self-pay | Admitting: Urology

## 2020-06-03 NOTE — Progress Notes (Signed)
Spoke w/ via phone for pre-op interview---pt Lab needs dos---- none              Lab results------see below COVID test ------06-07-2020 1100 Arrive at -------1045 am 06-10-2020 NPO after MN NO Solid Food.  Clear liquids from MN until---945 am then npo Med rec completed Medications to take morning of surgery -----primidone, propranolol Diabetic medication -----n/a Patient instructed to bring photo id and insurance card day of surgery Patient aware to have Driver (ride ) / caregiver  Wife brenda will stay    for 24 hours after surgery  Patient Special Instructions -----no smoking for 24 hours before surgery, no chewing tobacco day of surgery Pre-Op special Istructions -----none Patient verbalized understanding of instructions that were given at this phone interview. Patient denies shortness of breath, chest pain, fever, cough at this phone interview.

## 2020-06-07 ENCOUNTER — Other Ambulatory Visit (HOSPITAL_COMMUNITY)
Admission: RE | Admit: 2020-06-07 | Discharge: 2020-06-07 | Disposition: A | Discharge status: Home / Self Care | Payer: POS | Source: Ambulatory Visit | Attending: Urology | Admitting: Urology

## 2020-06-07 DIAGNOSIS — Z20822 Contact with and (suspected) exposure to covid-19: Secondary | ICD-10-CM | POA: Insufficient documentation

## 2020-06-07 DIAGNOSIS — Z01812 Encounter for preprocedural laboratory examination: Secondary | ICD-10-CM | POA: Insufficient documentation

## 2020-06-07 LAB — SARS CORONAVIRUS 2 (TAT 6-24 HRS): SARS Coronavirus 2: NEGATIVE

## 2020-06-10 ENCOUNTER — Encounter (HOSPITAL_BASED_OUTPATIENT_CLINIC_OR_DEPARTMENT_OTHER)
Admission: RE | Disposition: A | Discharge status: Home / Self Care | Payer: Self-pay | Source: Ambulatory Visit | Attending: Urology

## 2020-06-10 ENCOUNTER — Other Ambulatory Visit: Payer: Self-pay

## 2020-06-10 ENCOUNTER — Ambulatory Visit (HOSPITAL_BASED_OUTPATIENT_CLINIC_OR_DEPARTMENT_OTHER): Payer: POS | Admitting: Anesthesiology

## 2020-06-10 ENCOUNTER — Encounter (HOSPITAL_BASED_OUTPATIENT_CLINIC_OR_DEPARTMENT_OTHER): Payer: Self-pay | Admitting: Urology

## 2020-06-10 ENCOUNTER — Ambulatory Visit (HOSPITAL_BASED_OUTPATIENT_CLINIC_OR_DEPARTMENT_OTHER)
Admission: RE | Admit: 2020-06-10 | Discharge: 2020-06-10 | Disposition: A | Discharge status: Home / Self Care | Payer: POS | Source: Ambulatory Visit | Attending: Urology | Admitting: Urology

## 2020-06-10 DIAGNOSIS — Z8547 Personal history of malignant neoplasm of testis: Secondary | ICD-10-CM | POA: Diagnosis not present

## 2020-06-10 DIAGNOSIS — Z79899 Other long term (current) drug therapy: Secondary | ICD-10-CM | POA: Insufficient documentation

## 2020-06-10 DIAGNOSIS — R3911 Hesitancy of micturition: Secondary | ICD-10-CM | POA: Insufficient documentation

## 2020-06-10 DIAGNOSIS — Z923 Personal history of irradiation: Secondary | ICD-10-CM | POA: Diagnosis not present

## 2020-06-10 DIAGNOSIS — N401 Enlarged prostate with lower urinary tract symptoms: Secondary | ICD-10-CM | POA: Diagnosis present

## 2020-06-10 DIAGNOSIS — R3912 Poor urinary stream: Secondary | ICD-10-CM | POA: Insufficient documentation

## 2020-06-10 DIAGNOSIS — Z791 Long term (current) use of non-steroidal anti-inflammatories (NSAID): Secondary | ICD-10-CM | POA: Insufficient documentation

## 2020-06-10 DIAGNOSIS — N138 Other obstructive and reflux uropathy: Secondary | ICD-10-CM

## 2020-06-10 DIAGNOSIS — Z9079 Acquired absence of other genital organ(s): Secondary | ICD-10-CM | POA: Insufficient documentation

## 2020-06-10 DIAGNOSIS — F17210 Nicotine dependence, cigarettes, uncomplicated: Secondary | ICD-10-CM | POA: Diagnosis not present

## 2020-06-10 DIAGNOSIS — Z8616 Personal history of COVID-19: Secondary | ICD-10-CM | POA: Diagnosis not present

## 2020-06-10 HISTORY — DX: Essential tremor: G25.0

## 2020-06-10 HISTORY — DX: Chronic obstructive pulmonary disease, unspecified: J44.9

## 2020-06-10 HISTORY — PX: THULIUM LASER TURP (TRANSURETHRAL RESECTION OF PROSTATE): SHX6744

## 2020-06-10 HISTORY — DX: Insomnia, unspecified: G47.00

## 2020-06-10 HISTORY — DX: Presence of spectacles and contact lenses: Z97.3

## 2020-06-10 HISTORY — DX: Personal history of other diseases of the digestive system: Z87.19

## 2020-06-10 HISTORY — DX: Nocturia: R35.1

## 2020-06-10 SURGERY — THULIUM LASER TURP (TRANSURETHRAL RESECTION OF PROSTATE)
Anesthesia: General

## 2020-06-10 MED ORDER — HYDROCODONE-ACETAMINOPHEN 5-325 MG PO TABS: 1.0000 | ORAL_TABLET | Freq: Four times a day (QID) | ORAL | 0 refills | Status: DC | PRN

## 2020-06-10 MED ORDER — FENTANYL CITRATE (PF) 100 MCG/2ML IJ SOLN
25.0000 ug | INTRAMUSCULAR | Status: DC | PRN
  Administered 2020-06-10: 25 ug via INTRAVENOUS

## 2020-06-10 MED ORDER — SODIUM CHLORIDE 0.9 % IR SOLN
Status: DC | PRN
  Administered 2020-06-10: 6000 mL via INTRAVESICAL

## 2020-06-10 MED ORDER — EPHEDRINE SULFATE-NACL 50-0.9 MG/10ML-% IV SOSY
PREFILLED_SYRINGE | INTRAVENOUS | Status: DC | PRN
  Administered 2020-06-10 (×2): 10 mg via INTRAVENOUS

## 2020-06-10 MED ORDER — FENTANYL CITRATE (PF) 100 MCG/2ML IJ SOLN
INTRAMUSCULAR | Status: AC
  Filled 2020-06-10: qty 2

## 2020-06-10 MED ORDER — OXYCODONE HCL 5 MG PO TABS
5.0000 mg | ORAL_TABLET | Freq: Once | ORAL | Status: DC | PRN
Start: 2020-06-10 — End: 2020-06-10

## 2020-06-10 MED ORDER — CEPHALEXIN 500 MG PO CAPS: 500.0000 mg | ORAL_CAPSULE | Freq: Every day | ORAL | 0 refills | Status: DC

## 2020-06-10 MED ORDER — ONDANSETRON HCL 4 MG/2ML IJ SOLN
INTRAMUSCULAR | Status: DC | PRN
  Administered 2020-06-10: 4 mg via INTRAVENOUS

## 2020-06-10 MED ORDER — ACETAMINOPHEN 325 MG PO TABS: 325.0000 mg | ORAL_TABLET | ORAL | Status: DC | PRN

## 2020-06-10 MED ORDER — LIDOCAINE 2% (20 MG/ML) 5 ML SYRINGE
INTRAMUSCULAR | Status: AC
  Filled 2020-06-10: qty 5

## 2020-06-10 MED ORDER — PROPOFOL 10 MG/ML IV BOLUS
INTRAVENOUS | Status: DC | PRN
  Administered 2020-06-10: 160 mg via INTRAVENOUS

## 2020-06-10 MED ORDER — ACETAMINOPHEN 160 MG/5ML PO SOLN: 325.0000 mg | ORAL | Status: DC | PRN

## 2020-06-10 MED ORDER — BELLADONNA ALKALOIDS-OPIUM 16.2-60 MG RE SUPP
RECTAL | Status: DC | PRN
  Administered 2020-06-10: 1 via RECTAL

## 2020-06-10 MED ORDER — EPHEDRINE 5 MG/ML INJ
INTRAVENOUS | Status: AC
  Filled 2020-06-10: qty 10

## 2020-06-10 MED ORDER — ONDANSETRON HCL 4 MG/2ML IJ SOLN
INTRAMUSCULAR | Status: AC
  Filled 2020-06-10: qty 2

## 2020-06-10 MED ORDER — DEXAMETHASONE SODIUM PHOSPHATE 10 MG/ML IJ SOLN
INTRAMUSCULAR | Status: DC | PRN
  Administered 2020-06-10: 10 mg via INTRAVENOUS

## 2020-06-10 MED ORDER — ONDANSETRON HCL 4 MG/2ML IJ SOLN: 4.0000 mg | Freq: Once | INTRAMUSCULAR | Status: DC | PRN

## 2020-06-10 MED ORDER — PROPOFOL 10 MG/ML IV BOLUS
INTRAVENOUS | Status: AC
  Filled 2020-06-10: qty 20

## 2020-06-10 MED ORDER — CEFAZOLIN SODIUM-DEXTROSE 2-4 GM/100ML-% IV SOLN
INTRAVENOUS | Status: AC
  Filled 2020-06-10: qty 100

## 2020-06-10 MED ORDER — MEPERIDINE HCL 25 MG/ML IJ SOLN: 6.2500 mg | INTRAMUSCULAR | Status: DC | PRN

## 2020-06-10 MED ORDER — CEFAZOLIN SODIUM-DEXTROSE 2-4 GM/100ML-% IV SOLN
2.0000 g | Freq: Once | INTRAVENOUS | Status: AC
  Administered 2020-06-10: 2 g via INTRAVENOUS

## 2020-06-10 MED ORDER — MIDAZOLAM HCL 5 MG/5ML IJ SOLN
INTRAMUSCULAR | Status: DC | PRN
  Administered 2020-06-10: 2 mg via INTRAVENOUS

## 2020-06-10 MED ORDER — OXYCODONE HCL 5 MG/5ML PO SOLN: 5.0000 mg | Freq: Once | ORAL | Status: DC | PRN

## 2020-06-10 MED ORDER — MIDAZOLAM HCL 2 MG/2ML IJ SOLN
INTRAMUSCULAR | Status: AC
  Filled 2020-06-10: qty 2

## 2020-06-10 MED ORDER — DEXAMETHASONE SODIUM PHOSPHATE 10 MG/ML IJ SOLN
INTRAMUSCULAR | Status: AC
  Filled 2020-06-10: qty 1

## 2020-06-10 MED ORDER — LIDOCAINE 2% (20 MG/ML) 5 ML SYRINGE
INTRAMUSCULAR | Status: DC | PRN
  Administered 2020-06-10: 60 mg via INTRAVENOUS

## 2020-06-10 MED ORDER — FENTANYL CITRATE (PF) 250 MCG/5ML IJ SOLN
INTRAMUSCULAR | Status: DC | PRN
  Administered 2020-06-10 (×4): 25 ug via INTRAVENOUS

## 2020-06-10 MED ORDER — BELLADONNA ALKALOIDS-OPIUM 16.2-60 MG RE SUPP
RECTAL | Status: AC
  Filled 2020-06-10: qty 1

## 2020-06-10 MED ORDER — LACTATED RINGERS IV SOLN: INTRAVENOUS | Status: DC

## 2020-06-10 SURGICAL SUPPLY — 21 items
BAG DRAIN URO-CYSTO SKYTR STRL (DRAIN) ×2 IMPLANT
BAG DRN RND TRDRP ANRFLXCHMBR (UROLOGICAL SUPPLIES) ×1
BAG DRN UROCATH (DRAIN) ×1
BAG URINE DRAIN 2000ML AR STRL (UROLOGICAL SUPPLIES) ×2 IMPLANT
CATH COUDE FOLEY 2W 5CC 18FR (CATHETERS) IMPLANT
CATH FOLEY 2WAY SLVR 30CC 20FR (CATHETERS) ×1 IMPLANT
CATH FOLEY 3WAY 30CC 22FR (CATHETERS) IMPLANT
CLOTH BEACON ORANGE TIMEOUT ST (SAFETY) ×2 IMPLANT
GLOVE SURG ENC MOIS LTX SZ7.5 (GLOVE) ×2 IMPLANT
GLOVE SURG ENC MOIS LTX SZ8 (GLOVE) IMPLANT
GOWN STRL REUS W/TWL LRG LVL3 (GOWN DISPOSABLE) ×2 IMPLANT
HOLDER FOLEY CATH W/STRAP (MISCELLANEOUS) ×2 IMPLANT
IV NS IRRIG 3000ML ARTHROMATIC (IV SOLUTION) ×2 IMPLANT
KIT TURNOVER CYSTO (KITS) ×2 IMPLANT
LASER REVOLIX PROCEDURE (MISCELLANEOUS) ×2 IMPLANT
LOOP CUT BIPOLAR 24F LRG (ELECTROSURGICAL) IMPLANT
MANIFOLD NEPTUNE II (INSTRUMENTS) ×2 IMPLANT
PACK CYSTO (CUSTOM PROCEDURE TRAY) ×2 IMPLANT
SYR 30ML LL (SYRINGE) ×1 IMPLANT
TUBE CONNECTING 12X1/4 (SUCTIONS) ×1 IMPLANT
TUBING UROLOGY SET (TUBING) IMPLANT

## 2020-06-10 NOTE — Transfer of Care (Signed)
Immediate Anesthesia Transfer of Care Note  Patient: ATREUS HASZ  Procedure(s) Performed: THULIUM LASER TURP (TRANSURETHRAL RESECTION OF PROSTATE) (N/A )  Patient Location: PACU  Anesthesia Type:General  Level of Consciousness: awake, alert , oriented and patient cooperative  Airway & Oxygen Therapy: Patient Spontanous Breathing and Patient connected to nasal cannula oxygen  Post-op Assessment: Report given to RN, Post -op Vital signs reviewed and stable and Patient moving all extremities  Post vital signs: Reviewed and stable  Last Vitals:  Vitals Value Taken Time  BP 109/62 06/10/20 1416  Temp    Pulse 65 06/10/20 1420  Resp 14 06/10/20 1420  SpO2 97 % 06/10/20 1420  Vitals shown include unvalidated device data.  Last Pain:  Vitals:   06/10/20 1137  TempSrc: Oral  PainSc: 0-No pain      Patients Stated Pain Goal: 5 (90/24/09 7353)  Complications: No complications documented.

## 2020-06-10 NOTE — Op Note (Signed)
Preoperative diagnosis: BPH, weak stream Postoperative diagnosis: Same  Procedure: Cystoscopy with thulium laser vaporization of the prostate  Surgeon: Junious Silk  Anesthesia: General  Indication for procedure: Cameron [name] is a 70 year old male with a history of BPH.  He tried alpha blockers and daily PDE 5 inhibitors without significant relief and wanted to get off medication.    Findings: On exam under anesthesia the prostate was about 40 g and smooth without hard area or nodule.  Penis circumcised without mass or lesion.  Right testicle surgically absent.  Left testicle palpably normal.  On cystoscopy there was lateral lobe but more significantly and median lobe hypertrophy and obstruction.  Bladder mildly trabeculated without stone or foreign body.  No mucosal lesions noted.  Ureteral orifice ease appeared normal with clear reflux.  Description of procedure: After consent was obtained patient brought the operating room.  After adequate anesthesia he was placed in lithotomy position and prepped and draped in the usual sterile fashion.  A timeout was performed for the patient and procedure.  Cystoscope was passed per urethra and the bladder carefully inspected.  I used the coag to mark the left and the right ureteral orifice.-Started on the left side about the 5 o'clock position made an incision in the bladder neck between the lateral and median lobe of the prostate and brought this down to the verumontanum in line with the left ureteral orifice.  Similar incision made on the right.  The median lobe was then vaporized.  Energy turned up from 100-1 50.  The left lateral lobe was then vaporized anterior to posterior bladder neck down to the verumontanum.  Similar on the right.  This created an excellent channel.  Hemostasis was excellent after using the coag to stop a few areas of bruising.  Bladder and ureteral orifice ease appeared normal.  Scope was withdrawn and a two-way 20 French Foley catheter was  placed with the catheter guide.  Balloon was inflated to 18 cc and it was seated at the bladder neck.  Irrigation was clear.  He was awakened taken recovery in stable condition.  Complications: None  Blood loss: Minimal  Specimens: None  Drains: 20 [place] two-way catheter  Disposition: Patient stable to PACU

## 2020-06-10 NOTE — Anesthesia Preprocedure Evaluation (Addendum)
Anesthesia Evaluation  Patient identified by MRN, date of birth, ID band Patient awake    Reviewed: Allergy & Precautions, H&P , NPO status , Patient's Chart, lab work & pertinent test results, reviewed documented beta blocker date and time   Airway Mallampati: I  TM Distance: >3 FB Neck ROM: full    Dental no notable dental hx. (+) Teeth Intact, Dental Advisory Given   Pulmonary COPD, Current Smoker and Patient abstained from smoking.,    Pulmonary exam normal breath sounds clear to auscultation       Cardiovascular Exercise Tolerance: Good Pt. on home beta blockers  Rhythm:regular Rate:Normal     Neuro/Psych    GI/Hepatic hiatal hernia, GERD  Medicated and Controlled,  Endo/Other    Renal/GU      Musculoskeletal  (+) Arthritis , Osteoarthritis,    Abdominal   Peds  Hematology   Anesthesia Other Findings   Reproductive/Obstetrics                            Anesthesia Physical Anesthesia Plan  ASA: III  Anesthesia Plan: General   Post-op Pain Management:    Induction:   PONV Risk Score and Plan: 2 and Ondansetron and Dexamethasone  Airway Management Planned: Oral ETT and LMA  Additional Equipment:   Intra-op Plan:   Post-operative Plan:   Informed Consent: I have reviewed the patients History and Physical, chart, labs and discussed the procedure including the risks, benefits and alternatives for the proposed anesthesia with the patient or authorized representative who has indicated his/her understanding and acceptance.     Dental Advisory Given  Plan Discussed with: CRNA and Anesthesiologist  Anesthesia Plan Comments:         Anesthesia Quick Evaluation

## 2020-06-10 NOTE — H&P (Signed)
H&P  Chief Complaint: BPH, weak stream  History of Present Illness: [name] is a 70 year old male with a history of BPH.  He tried tamsulosin.  He has had a history of weak stream and hesitancy.  His AUA symptom score progressed to 27.  He tried a daily PDE 5 inhibitor.  His prostate was 39 g on imaging.  He underwent cystoscopy which showed lateral and median lobe hypertrophy and he elected to proceed with thulium laser vaporization of the prostate after considering all treatment options including surveillance or doing nothing.  His urinalysis was clear.  He is well today without fever, or dysuria.  No gross hematuria.  Past Medical History:  Diagnosis Date  . Arthritis   . Benign essential tremor   . BPH (benign prostatic hyperplasia)   . Concussion 1968   no residual from  . COPD (chronic obstructive pulmonary disease) (HCC)    no inhalers  . COVID 03/04/2020   runny nose  at times  loss of taste and smell still present  . GERD (gastroesophageal reflux disease)   . History of hiatal hernia    small per dr Osborne Casco  . 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 Hx of gynecomastia    due to hcg shots for male infertility  . Insomnia   . Nocturia   . Tobacco abuse   . Varicocele   . Wears glasses    for reading   Past Surgical History:  Procedure Laterality Date  . APPENDECTOMY  1965   open  . MASTECTOMY     1 sx for each breast enalrgement sx was 10 yrs apaprt  . ORCHIECTOMY  2005   right  . surgery for undescended testicle  age 70   right  . varicocoele  1983 or 1984    Home Medications:  Medications Prior to Admission  Medication Sig Dispense Refill Last Dose  . ASPIRIN LOW DOSE 81 MG chewable tablet TAKE 1 TABLET BY MOUTH EVERY DAY 90 tablet 0 05/25/2020 at Unknown time  . Cholecalciferol (VITAMIN D-3) 125 MCG (5000 UT) TABS Take 1 tablet by mouth daily.   Past Month at Unknown time  . clonazePAM (KLONOPIN) 0.5 MG  tablet Take 0.25 mg by mouth at bedtime.   06/09/2020 at Unknown time  . Coenzyme Q10 (CO Q 10 PO) Take 1 tablet by mouth daily.   Past Month at Unknown time  . meloxicam (MOBIC) 7.5 MG tablet Take 7.5 mg by mouth daily.  6 Past Month at Unknown time  . Multiple Vitamin (MULTIVITAMIN ADULT) TABS Take 1 tablet by mouth daily.   Past Month at Unknown time  . omeprazole (PRILOSEC OTC) 20 MG tablet Take 20 mg by mouth daily.   06/10/2020 at 0900  . primidone (MYSOLINE) 50 MG tablet TAKE 3 TABLETS (150 MG TOTAL) BY MOUTH 2 (TWO) TIMES DAILY. 540 tablet 0 06/10/2020 at 0900  . propranolol (INDERAL) 20 MG tablet Take 1 tablet (20 mg total) by mouth 2 (two) times daily. 180 tablet 1 06/10/2020 at 0900  . rosuvastatin (CRESTOR) 20 MG tablet TAKE 1 TABLET BY MOUTH EVERY DAY (Patient taking differently: at bedtime.) 90 tablet 3 06/09/2020 at Unknown time  . sertraline (ZOLOFT) 50 MG tablet Take 50 mg by mouth daily.  6 06/09/2020 at Unknown time  . tadalafil (CIALIS) 5 MG tablet Take 5 mg by mouth daily.   Past Month at Unknown time   Allergies:  No Known Allergies  Family History  Problem Relation Age of Onset  . Prostate cancer Father   . Stroke Mother   . Prostate cancer Brother   . Hypertension Sister   . High Cholesterol Sister   . Hypertension Sister   . Memory loss Sister    Social History:  reports that he has been smoking cigarettes. He has a 60.00 pack-year smoking history. His smokeless tobacco use includes chew. He reports previous alcohol use. He reports that he does not use drugs.  ROS: A complete review of systems was performed.  All systems are negative except for pertinent findings as noted. Review of Systems  All other systems reviewed and are negative.    Physical Exam:  Vital signs in last 24 hours: Temp:  [97.3 F (36.3 C)] 97.3 F (36.3 C) (04/08 1137) Pulse Rate:  [55] 55 (04/08 1137) Resp:  [16] 16 (04/08 1137) BP: (128)/(78) 128/78 (04/08 1137) SpO2:  [97 %] 97 % (04/08  1137) Weight:  [73.3 kg] 73.3 kg (04/08 1137) General:  Alert and oriented, No acute distress HEENT: Normocephalic, atraumatic Neck: No JVD or lymphadenopathy Cardiovascular: Regular rate and rhythm Lungs: Regular rate and effort Abdomen: Soft, nontender, nondistended, no abdominal masses Extremities: No edema Neurologic: Grossly intact  Laboratory Data:  No results found for this or any previous visit (from the past 24 hour(s)). Recent Results (from the past 240 hour(s))  SARS CORONAVIRUS 2 (TAT 6-24 HRS) Nasopharyngeal Nasopharyngeal Swab     Status: None   Collection Time: 06/07/20 11:03 AM   Specimen: Nasopharyngeal Swab  Result Value Ref Range Status   SARS Coronavirus 2 NEGATIVE NEGATIVE Final    Comment: (NOTE) SARS-CoV-2 target nucleic acids are NOT DETECTED.  The SARS-CoV-2 RNA is generally detectable in upper and lower respiratory specimens during the acute phase of infection. Negative results do not preclude SARS-CoV-2 infection, do not rule out co-infections with other pathogens, and should not be used as the sole basis for treatment or other patient management decisions. Negative results must be combined with clinical observations, patient history, and epidemiological information. The expected result is Negative.  Fact Sheet for Patients: SugarRoll.be  Fact Sheet for Healthcare Providers: https://www.woods-mathews.com/  This test is not yet approved or cleared by the [place] FDA and  has been authorized for detection and/or diagnosis of SARS-CoV-2 by FDA under an Emergency Use Authorization (EUA). This EUA will remain  in effect (meaning this test can be used) for the duration of the COVID-19 declaration under Se ction 564(b)(1) of the Act, 21 U.S.C. section 360bbb-3(b)(1), unless the authorization is terminated or revoked sooner.  Performed at [facility], [place] [address] [place], [place] [address]     Creatinine: No results for input(s): CREATININE in the last 168 hours.  Impression/Assessment/plan: I discussed with the patient the nature, potential benefits, risks and alternatives to thulium laser vaporization of the prostate, including side effects of the proposed treatment, the likelihood of the patient achieving the goals of the procedure, and any potential problems that might occur during the procedure or recuperation.  He asked if he would be able to "have a little sex after this".  We discussed risk of erectile dysfunction and orgasm issues after these procedures also more common risk of retrograde ejaculation among others.  All questions answered. Patient elects to proceed.     Festus Aloe 06/10/2020, 1:08 PM

## 2020-06-10 NOTE — Anesthesia Procedure Notes (Signed)
Procedure Name: LMA Insertion Date/Time: 06/10/2020 1:23 PM Performed by: Myna Bright, CRNA Pre-anesthesia Checklist: Patient identified, Emergency Drugs available, Patient being monitored and Suction available Patient Re-evaluated:Patient Re-evaluated prior to induction Oxygen Delivery Method: Circle system utilized Preoxygenation: Pre-oxygenation with 100% oxygen Induction Type: IV induction Ventilation: Mask ventilation without difficulty LMA: LMA inserted LMA Size: 4.0 Number of attempts: 1 Placement Confirmation: positive ETCO2 and breath sounds checked- equal and bilateral Tube secured with: Tape Dental Injury: Teeth and Oropharynx as per pre-operative assessment

## 2020-06-10 NOTE — Anesthesia Postprocedure Evaluation (Signed)
Anesthesia Post Note  Patient: Cameron [name]  Procedure(s) Performed: THULIUM LASER TURP (TRANSURETHRAL RESECTION OF PROSTATE) (N/A )     Patient location during evaluation: PACU Anesthesia Type: General Level of consciousness: awake and alert Pain management: pain level controlled Vital Signs Assessment: post-procedure vital signs reviewed and stable Respiratory status: spontaneous breathing, nonlabored ventilation, respiratory function stable and patient connected to nasal cannula oxygen Cardiovascular status: blood pressure returned to baseline and stable Postop Assessment: no apparent nausea or vomiting Anesthetic complications: no   No complications documented.  Last Vitals:  Vitals:   06/10/20 1430 06/10/20 1445  BP: 118/70 130/67  Pulse: 61 64  Resp: 11 14  Temp:    SpO2: 94% 95%    Last Pain:  Vitals:   06/10/20 1445  TempSrc:   PainSc: 6                  Trianna Lupien

## 2020-06-10 NOTE — Discharge Instructions (Signed)
Indwelling Urinary Catheter Care, Adult An indwelling urinary catheter is a thin tube that is put into your bladder. The tube helps to drain pee (urine) out of your body. The tube goes in through your urethra. Your urethra is where pee comes out of your body. Your pee will come out through the catheter, then it will go into a bag (drainage bag). Take good care of your catheter so it will work well. How to wear your catheter and bag Supplies needed  Sticky tape (adhesive tape) or a leg strap.  Alcohol wipe or soap and water (if you use tape).  A clean towel (if you use tape).  Large overnight bag.  Smaller bag (leg bag). Wearing your catheter Attach your catheter to your leg with tape or a leg strap.  Make sure the catheter is not pulled tight.  If a leg strap gets wet, take it off and put on a dry strap.  If you use tape to hold the bag on your leg: 1. Use an alcohol wipe or soap and water to wash your skin where the tape made it sticky before. 2. Use a clean towel to pat-dry that skin. 3. Use new tape to make the bag stay on your leg. Wearing your bags You should have been given a large overnight bag.  You may wear the overnight bag in the day or night.  Always have the overnight bag lower than your bladder.  Do not let the bag touch the floor.  Before you go to sleep, put a clean plastic bag in a wastebasket. Then hang the overnight bag inside the wastebasket. You should also have a smaller leg bag that fits under your clothes.  Always wear the leg bag below your knee.  Do not wear your leg bag at night. How to care for your skin and catheter Supplies needed  A clean washcloth.  Water and mild soap.  A clean towel. Caring for your skin and catheter  Clean the skin around your catheter every day: 1. Wash your hands with soap and water. 2. Wet a clean washcloth in warm water and mild soap. 3. Clean the skin around your urethra.  If you are male:  Gently  spread the folds of skin around your vagina (labia).  With the washcloth in your other hand, wipe the inner side of your labia on each side. Wipe from front to back.  If you are male:  Pull back any skin that covers the end of your penis (foreskin).  With the washcloth in your other hand, wipe your penis in small circles. Start wiping at the tip of your penis, then move away from the catheter.  Move the foreskin back in place, if needed. 4. With your free hand, hold the catheter close to where it goes into your body.  Keep holding the catheter during cleaning so it does not get pulled out. 5. With the washcloth in your other hand, clean the catheter.  Only wipe downward on the catheter.  Do not wipe upward toward your body. Doing this may push germs into your urethra and cause infection. 6. Use a clean towel to pat-dry the catheter and the skin around it. Make sure to wipe off all soap. 7. Wash your hands with soap and water.  Shower every day. Do not take baths.  Do not use cream, ointment, or lotion on the area where the catheter goes into your body, unless your doctor tells you to.  Do not   use powders, sprays, or lotions on your genital area.  Check your skin around the catheter every day for signs of infection. Check for: ? Redness, swelling, or pain. ? Fluid or blood. ? Warmth. ? Pus or a bad smell.      How to empty the bag Supplies needed  Rubbing alcohol.  Gauze pad or cotton ball.  Tape or a leg strap. Emptying the bag Pour the pee out of your bag when it is ?- full, or at least 2-3 times a day. Do this for your overnight bag and your leg bag. 1. Wash your hands with soap and water. 2. Separate (detach) the bag from your leg. 3. Hold the bag over the toilet or a clean pail. Keep the bag lower than your hips and bladder. This is so the pee (urine) does not go back into the tube. 4. Open the pour spout. It is at the bottom of the bag. 5. Empty the pee into the  toilet or pail. Do not let the pour spout touch any surface. 6. Put rubbing alcohol on a gauze pad or cotton ball. 7. Use the gauze pad or cotton ball to clean the pour spout. 8. Close the pour spout. 9. Attach the bag to your leg with tape or a leg strap. 10. Wash your hands with soap and water. Follow instructions for cleaning the drainage bag:  From the product maker.  As told by your doctor. How to change the bag Supplies needed  Alcohol wipes.  A clean bag.  Tape or a leg strap. Changing the bag Replace your bag when it starts to leak, smell bad, or look dirty. 1. Wash your hands with soap and water. 2. Separate the dirty bag from your leg. 3. Pinch the catheter with your fingers so that pee does not spill out. 4. Separate the catheter tube from the bag tube where these tubes connect (at the connection valve). Do not let the tubes touch any surface. 5. Clean the end of the catheter tube with an alcohol wipe. Use a different alcohol wipe to clean the end of the bag tube. 6. Connect the catheter tube to the tube of the clean bag. 7. Attach the clean bag to your leg with tape or a leg strap. Do not make the bag tight on your leg. 8. Wash your hands with soap and water. General rules  Never pull on your catheter. Never try to take it out. Doing that can hurt you.  Always wash your hands before and after you touch your catheter or bag. Use a mild, fragrance-free soap. If you do not have soap and water, use hand sanitizer.  Always make sure there are no twists or bends (kinks) in the catheter tube.  Always make sure there are no leaks in the catheter or bag.  Drink enough fluid to keep your pee pale yellow.  Do not take baths, swim, or use a hot tub.  If you are male, wipe from front to back after you poop (have a bowel movement).   Contact a doctor if:  Your pee is cloudy.  Your pee smells worse than usual.  Your catheter gets clogged.  Your catheter  leaks.  Your bladder feels full. Get help right away if:  You have redness, swelling, or pain where the catheter goes into your body.  You have fluid, blood, pus, or a bad smell coming from the area where the catheter goes into your body.  Your skin feels   warm where the catheter goes into your body.  You have a fever.  You have pain in your: ? Belly (abdomen). ? Legs. ? Lower back. ? Bladder.  You see blood in the catheter.  Your pee is pink or red.  You feel sick to your stomach (nauseous).  You throw up (vomit).  You have chills.  Your pee is not draining into the bag.  Your catheter gets pulled out. Summary  An indwelling urinary catheter is a thin tube that is placed into the bladder to help drain pee (urine) out of the body.  The catheter is placed into the part of the body that drains pee from the bladder (urethra).  Taking good care of your catheter will keep it working properly and help prevent problems.  Always wash your hands before and after touching your catheter or bag.  Never pull on your catheter or try to take it out. This information is not intended to replace advice given to you by your health care provider. Make sure you discuss any questions you have with your health care provider. Document Revised: 06/13/2018 Document Reviewed: 10/05/2016 Elsevier Patient Education  [facility], Care After This sheet gives you information about how to care for yourself after your procedure. Your health care provider may also give you more specific instructions. If you have problems or questions, contact your health care provider. What can I expect after the procedure? For the first few weeks after the procedure, you may have:  A need to urinate often.  Blood in your urine.  A sudden need to urinate. After the thin, flexible tube called a urinary catheter is removed, you may have a burning feeling when you urinate. You will  feel this especially at the end of urination. This feeling usually passes within 3-5 days. Follow these instructions at home: Medicines  Take over-the-counter and prescription medicines, including stool softeners, only as told by your health care provider.  If you were given a medicine to help you relax (sedative) during the procedure, it can affect you for several hours. Do not drive or operate machinery until your health care provider says that it is safe.  If you were prescribed an antibiotic medicine, take it as told by your health care provider. Do not stop using the antibiotic even if you start to feel better. Activity  Rest as told by your health care provider.  Do not take baths, swim, or use a hot tub until your health care provider approves. Ask your health care provider if you may take showers. You may only be allowed to take sponge baths.  Do not do exercise that takes a lot of effort (is strenuous) for 1 week or as told by your health care provider.  Do not lift anything that is heavier than 10 lb (4.5 kg), or the limit that you are told, until your health care provider says that it is safe.  Avoid sexual activity for 4-6 weeks or as told by your health care provider.  Do not ride in a car for extended periods of time for 1 month or as told by your health care provider.  Return to your normal activities as told by your health care provider. Ask your health care provider what activities are safe for you.   General instructions  Do not strain to have a bowel movement. Straining may lead to bleeding from the prostate, cause clots to form, and cause trouble urinating.  Eat foods  that are high in fiber, such as fresh fruits and vegetables, whole grains, and beans.  Do not use any products that contain nicotine or tobacco, such as cigarettes, e-cigarettes, and chewing tobacco. If you need help quitting, ask your health care provider.  Keep all follow-up visits as told by your  health care provider. This is important. Contact a health care provider if:  You have a fever or chills.  You have spasms or pain with the urinary catheter still in place.  You struggle to start a urine stream when trying to urinate after the catheter has been removed.  You have trouble having or keeping an erection.  No semen comes out during orgasm (dry ejaculation). Get help right away if:  There is a blockage in your catheter.  Your catheter has been removed and you suddenly cannot urinate.  Your urine smells unusually bad.  You start to have blood clots in your urine.  The blood in your urine does not go away or gets thick.  You develop chest pains or shortness of breath.  You develop swelling or pain in your leg. These symptoms may represent a serious problem that is an emergency. Do not wait to see if the symptoms will go away. Get medical help right away. Call your local emergency services (911 in the [place] Do not drive yourself to the hospital. Summary  After the procedure, you may notice urinary symptoms for a few weeks.  Follow instructions from your health care provider about restrictions for activities such as lifting, exercise, and sex.  Contact your health care provider if you have fever or chills, spasms or pain while the catheter is in place, or trouble starting a urine stream after the catheter has been removed. This information is not intended to replace advice given to you by your health care provider. Make sure you discuss any questions you have with your health care provider. Document Revised: 03/30/2019 Document Reviewed: 03/30/2019 Elsevier Patient Education  2021 [place] Instructions  Activity: Get plenty of rest for the remainder of the day. A responsible individual must stay with you for 24 hours following the procedure.  For the next 24 hours, DO NOT: -Drive a car -Paediatric nurse -Drink alcoholic  beverages -Take any medication unless instructed by your physician -Make any legal decisions or sign important papers.  Meals: Start with liquid foods such as gelatin or soup. Progress to regular foods as tolerated. Avoid greasy, spicy, heavy foods. If nausea and/or vomiting occur, drink only clear liquids until the nausea and/or vomiting subsides. Call your physician if vomiting continues.  Special Instructions/Symptoms: Your throat may feel dry or sore from the anesthesia or the breathing tube placed in your throat during surgery. If this causes discomfort, gargle with warm salt water. The discomfort should disappear within 24 hours.  If you had a scopolamine patch placed behind your ear for the management of post- operative nausea and/or vomiting:  1. The medication in the patch is effective for 72 hours, after which it should be removed.  Wrap patch in a tissue and discard in the trash. Wash hands thoroughly with soap and water. 2. You may remove the patch earlier than 72 hours if you experience unpleasant side effects which may include dry mouth, dizziness or visual disturbances. 3. Avoid touching the patch. Wash your hands with soap and water after contact with the patch.

## 2020-06-13 ENCOUNTER — Encounter (HOSPITAL_BASED_OUTPATIENT_CLINIC_OR_DEPARTMENT_OTHER): Payer: Self-pay | Admitting: Urology

## 2020-07-20 ENCOUNTER — Ambulatory Visit: Payer: POS | Admitting: Neurology

## 2020-07-27 ENCOUNTER — Other Ambulatory Visit: Payer: Self-pay | Admitting: Neurology

## 2020-07-27 NOTE — Telephone Encounter (Signed)
Last OV-01/25/20 Last refill --01/23/21  Appt 8/22.  Please advise

## 2020-08-06 ENCOUNTER — Other Ambulatory Visit: Payer: Self-pay | Admitting: Neurology

## 2020-08-08 ENCOUNTER — Other Ambulatory Visit: Payer: Self-pay | Admitting: Cardiology

## 2020-08-08 DIAGNOSIS — R0789 Other chest pain: Secondary | ICD-10-CM

## 2020-08-11 ENCOUNTER — Other Ambulatory Visit: Payer: Self-pay | Admitting: Neurology

## 2020-08-11 NOTE — Telephone Encounter (Signed)
Last OV 01/25/20 Last refill--07/27/20  Please advise

## 2020-08-12 ENCOUNTER — Telehealth: Payer: Self-pay | Admitting: Neurology

## 2020-08-16 NOTE — Telephone Encounter (Signed)
Patient called in and wants a refill for his primidone. He said he knows dr.tat doesn't want him to get off this cold [place]. Michela Pitcher he has contacted the pharmacy a few times and they have not recd anything. [phone] or house phone and LM [phone]

## 2020-08-16 NOTE — Telephone Encounter (Signed)
Left message for patient to check at pharmacy. It was refilled per notes 08/12/2020

## 2020-09-28 ENCOUNTER — Telehealth: Payer: Self-pay | Admitting: Neurology

## 2020-09-28 NOTE — Telephone Encounter (Signed)
Pt called in, would like a call back regarding his medicine, propanolol. He said he has never had blood pressure medicine, and doesn't think he needs it. He bent down, got light headed, then passed out. He hit the concrete, but he is okay. For the past two weeks he has been cutting the pills, taking 1/2 in am and 1/2 in pm. He wants to know if its okay to stop taking this or does Tat want him to continue. Pt stated tremors have not got better.

## 2020-09-29 NOTE — Telephone Encounter (Signed)
Stop the medication

## 2020-09-29 NOTE — Telephone Encounter (Signed)
Called patient and left a message for a call back.  

## 2020-09-30 NOTE — Telephone Encounter (Signed)
Called patient and informed him that he may stop the medication. Patient stated that he stopped it a few days ago and has been feeling better. Patient had no further questions or concerns.

## 2020-09-30 NOTE — Telephone Encounter (Signed)
Patient returned call to [place]. 

## 2020-10-06 ENCOUNTER — Other Ambulatory Visit: Payer: Self-pay | Admitting: Neurology

## 2020-10-21 NOTE — Progress Notes (Signed)
Assessment/Plan:    1.  Essential Tremor  -Increase primidone, 50 mg, 3 tablets in the morning, 2 in the afternoon, 3 in the evening.  -cannot take propranolol - had syncope with it due to low BP  -Discussed again in detail with him surgical interventions, but patient not ready to quit smoking and we would not do the procedures while smoking due to poor wound healing.  -discussed focused ultrasound in detail at patients request.  He may consider that in the future.  Subjective:   Cameron [name] was seen today in follow up for essential tremor.  My previous records were reviewed prior to todays visit. Tremor about the same.  Has better and worse days.   Still smoking.  Pt denies falls.  We ended up discontinuing his Inderal because of near syncope/syncope.  This occurred in July.  States that he was smoking a cigarette bent down, he tried to get up and felt dizzy and lightheaded and passed out and hit the ground.  He was only out for seconds.  It was witnessed.  No loss of bladder/bowel control.  No issues since then.    Current prescribed movement disorder medications: Primidone, 50 mg, 3 tablets twice per day    Previous medications: Inderal, 20 mg twice per day (syncope)   ALLERGIES:  No Known Allergies  CURRENT MEDICATIONS:  Outpatient Encounter Medications as of 10/24/2020  Medication Sig   Cholecalciferol (VITAMIN D-3) 125 MCG (5000 UT) TABS Take 1 tablet by mouth daily.   clonazePAM (KLONOPIN) 0.5 MG tablet Take 0.25 mg by mouth at bedtime.   Coenzyme Q10 (CO Q 10 PO) Take 1 tablet by mouth daily.   [employer] ASPIRIN ADULT LOW DOSE 81 MG chewable tablet TAKE 1 TABLET BY MOUTH EVERY DAY   meloxicam (MOBIC) 7.5 MG tablet Take 7.5 mg by mouth daily.   Multiple Vitamin (MULTIVITAMIN ADULT) TABS Take 1 tablet by mouth daily.   omeprazole (PRILOSEC OTC) 20 MG tablet Take 20 mg by mouth daily.   pravastatin (PRAVACHOL) 40 MG tablet take 1/2 tablet   primidone (MYSOLINE) 50 MG tablet  TAKE 3 TABLETS (150 MG TOTAL) BY MOUTH 2 (TWO) TIMES DAILY.   rosuvastatin (CRESTOR) 20 MG tablet TAKE 1 TABLET BY MOUTH EVERY DAY   sertraline (ZOLOFT) 50 MG tablet Take 50 mg by mouth daily.   tadalafil (CIALIS) 5 MG tablet Take 5 mg by mouth daily.   [DISCONTINUED] propranolol (INDERAL) 20 MG tablet TAKE 1 TABLET BY MOUTH TWICE A DAY   [DISCONTINUED] cephALEXin (KEFLEX) 500 MG capsule Take 1 capsule (500 mg total) by mouth at bedtime. (Patient not taking: Reported on 10/24/2020)   [DISCONTINUED] HYDROcodone-acetaminophen (NORCO/VICODIN) 5-325 MG tablet Take 1-2 tablets by mouth every 6 (six) hours as needed for moderate pain. (Patient not taking: Reported on 10/24/2020)   No facility-administered encounter medications on file as of 10/24/2020.     Objective:    PHYSICAL EXAMINATION:    VITALS:   Vitals:   10/24/20 1111  BP: (!) 122/42  Pulse: 72  SpO2: 97%  Weight: 166 lb 3.2 oz (75.4 kg)  Height: '5\' 9"'$  (1.753 m)     GEN:  The patient appears stated age and is in NAD. HEENT:  Normocephalic, atraumatic.  The mucous membranes are moist. The superficial temporal arteries are without ropiness or tenderness. CV:  RRR Lungs:  CTAB Neck/HEME:  There are no carotid bruits bilaterally.  Neurological examination:  Orientation: The patient is alert and oriented  x3. Cranial nerves: There is good facial symmetry. The speech is fluent and clear. Soft palate rises symmetrically and there is no tongue deviation. Hearing is intact to conversational tone. Sensation: Sensation is intact to light touch throughout Motor: Strength is at least antigravity x4.  Movement examination: Tone: There is normal tone in the UE/LE Abnormal movements: there is RUE postural tremor.  When writing with the R hand, tremor is noted with L hand. Coordination:  There is no decremation with RAM's Gait and Station: The patient has no  difficulty arising out of a deep-seated chair without the use of the hands. The  patient's stride length is good I have reviewed and interpreted the following labs independently   Chemistry      Component Value Date/Time   NA 139 03/22/2017 1632   K 4.1 03/22/2017 1632   CL 106 03/22/2017 1632   CO2 26 03/22/2017 1632   BUN 8 03/22/2017 1632   CREATININE 0.99 03/22/2017 1632      Component Value Date/Time   CALCIUM 8.9 03/22/2017 1632      Lab Results  Component Value Date   WBC 6.2 03/22/2017   HGB 14.3 03/22/2017   HCT 42.1 03/22/2017   MCV 93.6 03/22/2017   PLT 202 03/22/2017   Lab Results  Component Value Date   TSH 2.36 08/11/2018     Chemistry      Component Value Date/Time   NA 139 03/22/2017 1632   K 4.1 03/22/2017 1632   CL 106 03/22/2017 1632   CO2 26 03/22/2017 1632   BUN 8 03/22/2017 1632   CREATININE 0.99 03/22/2017 1632      Component Value Date/Time   CALCIUM 8.9 03/22/2017 1632         Total time spent on today's visit was 33 minutes, including both face-to-face time and nonface-to-face time.  Time included that spent on review of records (prior notes available to me/labs/imaging if pertinent), discussing treatment and goals, answering patient's questions and coordinating care.  Cc:  Tisovec, Fransico Him, MD

## 2020-10-24 ENCOUNTER — Other Ambulatory Visit: Payer: Self-pay

## 2020-10-24 ENCOUNTER — Encounter: Payer: Self-pay | Admitting: Neurology

## 2020-10-24 ENCOUNTER — Ambulatory Visit (INDEPENDENT_AMBULATORY_CARE_PROVIDER_SITE_OTHER): Payer: POS | Admitting: Neurology

## 2020-10-24 VITALS — BP 122/42 | HR 72 | Ht 69.0 in | Wt 166.2 lb

## 2020-10-24 DIAGNOSIS — G250 Essential tremor: Secondary | ICD-10-CM | POA: Diagnosis not present

## 2020-10-24 MED ORDER — PRIMIDONE 50 MG PO TABS: ORAL_TABLET | ORAL | 1 refills | Status: DC

## 2020-10-24 NOTE — Patient Instructions (Signed)
Increase primidone - 3 in the AM, 1 in the afternoon, 3 in the evening x 1 week, then 3 in the AM, 2 in the afternoon, 3 in the evening thereafter

## 2020-11-06 ENCOUNTER — Other Ambulatory Visit: Payer: Self-pay | Admitting: Neurology

## 2020-11-07 ENCOUNTER — Other Ambulatory Visit: Payer: Self-pay | Admitting: Cardiology

## 2020-11-07 DIAGNOSIS — R0789 Other chest pain: Secondary | ICD-10-CM

## 2020-12-28 ENCOUNTER — Other Ambulatory Visit: Payer: Self-pay | Admitting: Cardiology

## 2020-12-28 DIAGNOSIS — E782 Mixed hyperlipidemia: Secondary | ICD-10-CM

## 2021-03-05 ENCOUNTER — Other Ambulatory Visit: Payer: Self-pay | Admitting: Cardiology

## 2021-03-05 DIAGNOSIS — R0789 Other chest pain: Secondary | ICD-10-CM

## 2021-04-24 ENCOUNTER — Other Ambulatory Visit: Payer: Self-pay | Admitting: Neurology

## 2021-04-24 DIAGNOSIS — G250 Essential tremor: Secondary | ICD-10-CM

## 2021-05-01 NOTE — Progress Notes (Signed)
Assessment/Plan:    1.  Essential Tremor  -continue primidone, 50 mg, 3 tablets in the morning, 2 in the afternoon, 3 in the evening.  Currently, he misses the middle of the day dosage about half the time.  I do recommend taking the faithfully.  Do not think another increase will help significantly in terms of tremor control.  -cannot take propranolol - had syncope with it due to low BP  -Discussed with him that while there are probably other medications to try, I do not think that they are going to be significantly impactful for this degree of tremor, especially the tremor on the right hand.  The only thing that is going to be significantly helpful is surgical intervention.  -Discussed again in detail with him surgical interventions, but patient not particularly interested right now.  In addition, he does not want to quit smoking and this would be required before surgical intervention.  We discussed both DBS and focused ultrasound today, as well as previous visits.  2.  Fu 9 months-1year  Subjective:   Cameron [name] was seen today in follow up for essential tremor.  My previous records were reviewed prior to todays visit.  We increased his primidone again last visit.  He is on a total of 400 mg/day.  He is still smoking.  No falls.  Tremor is about the same.    Current prescribed movement disorder medications: Primidone, 50 mg, 3 tablets in the morning, 2 in the afternoon, 3 in the evening (he misses middle of the day dosage 50% of the time)   Previous medications: Inderal, 20 mg twice per day (syncope)   ALLERGIES:  No Known Allergies  CURRENT MEDICATIONS:  Outpatient Encounter Medications as of 05/02/2021  Medication Sig   ASPIRIN LOW DOSE 81 MG chewable tablet TAKE 1 TABLET BY MOUTH EVERY DAY   Cholecalciferol (VITAMIN D-3) 125 MCG (5000 UT) TABS Take 1 tablet by mouth daily.   clonazePAM (KLONOPIN) 0.5 MG tablet Take 0.25 mg by mouth at bedtime.   Coenzyme Q10 (CO Q 10 PO)  Take 1 tablet by mouth daily.   meloxicam (MOBIC) 7.5 MG tablet Take 7.5 mg by mouth daily.   Multiple Vitamin (MULTIVITAMIN ADULT) TABS Take 1 tablet by mouth daily.   omeprazole (PRILOSEC OTC) 20 MG tablet Take 20 mg by mouth daily.   pravastatin (PRAVACHOL) 40 MG tablet take 1/2 tablet   primidone (MYSOLINE) 50 MG tablet TAKE 3 TABLETS BY MOUTH EVERY MORNING, 2 TABLET IN THE AFTERNOON AND 3 IN THE EVENING   rosuvastatin (CRESTOR) 20 MG tablet TAKE 1 TABLET BY MOUTH EVERY DAY   sertraline (ZOLOFT) 50 MG tablet Take 50 mg by mouth daily.   [DISCONTINUED] tadalafil (CIALIS) 5 MG tablet Take 5 mg by mouth daily.   No facility-administered encounter medications on file as of 05/02/2021.     Objective:    PHYSICAL EXAMINATION:    VITALS:   Vitals:   05/02/21 1101  BP: 115/72  Pulse: 75  SpO2: 95%  Weight: 168 lb 3.2 oz (76.3 kg)  Height: 5\' 9"  (1.753 m)      GEN:  The patient appears stated age and is in NAD. HEENT:  Normocephalic, atraumatic.  The mucous membranes are moist.   Neurological examination:  Orientation: The patient is alert and oriented x3. Cranial nerves: There is good facial symmetry. The speech is fluent and clear. Soft palate rises symmetrically and there is no tongue deviation. Hearing is intact  to conversational tone. Sensation: Sensation is intact to light touch throughout Motor: Strength is at least antigravity x4.  Movement examination: Tone: There is normal tone in the UE/LE Abnormal movements: there is RUE >> left upper extremity postural and intention tremor.  It is moderate on the right and mild to moderate on the left. Coordination:  There is no decremation with RAM's  I have reviewed and interpreted the following labs independently   Chemistry      Component Value Date/Time   NA 139 03/22/2017 1632   K 4.1 03/22/2017 1632   CL 106 03/22/2017 1632   CO2 26 03/22/2017 1632   BUN 8 03/22/2017 1632   CREATININE 0.99 03/22/2017 1632       Component Value Date/Time   CALCIUM 8.9 03/22/2017 1632      Lab Results  Component Value Date   WBC 6.2 03/22/2017   HGB 14.3 03/22/2017   HCT 42.1 03/22/2017   MCV 93.6 03/22/2017   PLT 202 03/22/2017   Lab Results  Component Value Date   TSH 2.36 08/11/2018     Chemistry      Component Value Date/Time   NA 139 03/22/2017 1632   K 4.1 03/22/2017 1632   CL 106 03/22/2017 1632   CO2 26 03/22/2017 1632   BUN 8 03/22/2017 1632   CREATININE 0.99 03/22/2017 1632      Component Value Date/Time   CALCIUM 8.9 03/22/2017 1632         Total time spent on today's visit was 32 minutes, including both face-to-face time and nonface-to-face time.  Time included that spent on review of records (prior notes available to me/labs/imaging if pertinent), discussing treatment and goals, answering patient's questions and coordinating care.  Cc:  Tisovec, Fransico Him, MD

## 2021-05-02 ENCOUNTER — Other Ambulatory Visit: Payer: Self-pay

## 2021-05-02 ENCOUNTER — Encounter: Payer: Self-pay | Admitting: Neurology

## 2021-05-02 ENCOUNTER — Ambulatory Visit: Payer: POS | Admitting: Neurology

## 2021-05-02 VITALS — BP 115/72 | HR 75 | Ht 69.0 in | Wt 168.2 lb

## 2021-05-02 DIAGNOSIS — G250 Essential tremor: Secondary | ICD-10-CM | POA: Diagnosis not present

## 2021-05-02 DIAGNOSIS — Z720 Tobacco use: Secondary | ICD-10-CM | POA: Diagnosis not present

## 2021-05-06 ENCOUNTER — Other Ambulatory Visit: Payer: Self-pay | Admitting: Cardiology

## 2021-05-06 DIAGNOSIS — R0789 Other chest pain: Secondary | ICD-10-CM

## 2021-07-24 ENCOUNTER — Telehealth: Payer: Self-pay | Admitting: Neurology

## 2021-07-24 ENCOUNTER — Other Ambulatory Visit: Payer: Self-pay | Admitting: Neurology

## 2021-07-24 DIAGNOSIS — G250 Essential tremor: Secondary | ICD-10-CM

## 2021-07-24 NOTE — Telephone Encounter (Signed)
Called and left message for patient to call back to make appt with Tat on 08-18-21 or 08-24-21 per Tat. He canceled his may appt and was schedule out until Jan so it will be a year and half since she would have seen him if he waits that long. So we wont be able to refill his RX after this 30 day supply is called to the pharmacy today.

## 2021-07-25 NOTE — Telephone Encounter (Signed)
Patient is scheduled for 6/16 with Dr. Carles Collet. 30 day rx has been sent.

## 2021-08-14 NOTE — Progress Notes (Signed)
Assessment/Plan:    1.  Essential Tremor  -continue primidone, 50 mg, 3 tablets in the morning, 2 in the afternoon, 3 in the evening.  Do not think another increase will help significantly in terms of tremor control.  -cannot take propranolol - had syncope with it due to low BP  -Discussed with him that while there are probably other medications to try, I do not think that they are going to be significantly impactful for this degree of tremor, especially the tremor on the right hand.  We discussed artane.  We ultimately decided it wasn't worth that.   The only thing that is going to be significantly helpful is surgical intervention.  -Discussed again in detail with him surgical interventions, but patient not particularly interested right now.  In addition, he does not want to quit smoking and this would be required before surgical intervention.  We discussed both DBS and focused ultrasound today, as well as previous visits.  He stated that he was unaware that we did DBS at [facility], and his biggest issue is really that he would need to quit smoking.  2.  F/u 8 months to 1 year  Subjective:   Cameron [name] was seen today in follow up for essential tremor.  My previous records were reviewed prior to todays visit.  Last visit, encouraged the patient to try and not miss the middle of the day dosages.  I also told him that I did not think that other medications likely would help significantly with his degree of tremor.  He ultimately did not want any surgical interventions and decided to follow-up with me at the end of the year.  He reports tremor is about the same.    Current prescribed movement disorder medications: Primidone, 50 mg, 3 tablets in the morning, 2 in the afternoon, 3 in the evening (he misses middle of the day dosage 50% of the time)   Previous medications: Inderal, 20 mg twice per day (syncope)   ALLERGIES:  No Known Allergies  CURRENT MEDICATIONS:  Outpatient Encounter  Medications as of 08/15/2021  Medication Sig   ASPIRIN LOW DOSE 81 MG chewable tablet TAKE 1 TABLET BY MOUTH EVERY DAY   Cholecalciferol (VITAMIN D-3) 125 MCG (5000 UT) TABS Take 1 tablet by mouth daily.   clonazePAM (KLONOPIN) 0.5 MG tablet Take 0.25 mg by mouth at bedtime.   Coenzyme Q10 (CO Q 10 PO) Take 1 tablet by mouth daily.   meloxicam (MOBIC) 7.5 MG tablet Take 7.5 mg by mouth daily.   Multiple Vitamin (MULTIVITAMIN ADULT) TABS Take 1 tablet by mouth daily.   omeprazole (PRILOSEC OTC) 20 MG tablet Take 20 mg by mouth daily.   pravastatin (PRAVACHOL) 40 MG tablet take 1/2 tablet   primidone (MYSOLINE) 50 MG tablet TAKE 3 TABLETS BY MOUTH EVERY MORNING, 2 TABLET IN THE AFTERNOON AND 3 IN THE EVENING   rosuvastatin (CRESTOR) 20 MG tablet TAKE 1 TABLET BY MOUTH EVERY DAY   sertraline (ZOLOFT) 50 MG tablet Take 50 mg by mouth daily.   No facility-administered encounter medications on file as of 08/15/2021.     Objective:    PHYSICAL EXAMINATION:    VITALS:   Vitals:   08/15/21 1251  Weight: 168 lb 3.2 oz (76.3 kg)  Height: '5\' 9"'$  (1.753 m)       GEN:  The patient appears stated age and is in NAD. HEENT:  Normocephalic, atraumatic.  The mucous membranes are moist.   Neurological  examination:  Orientation: The patient is alert and oriented x3. Cranial nerves: There is good facial symmetry. The speech is fluent and clear. Soft palate rises symmetrically and there is no tongue deviation. Hearing is intact to conversational tone. Sensation: Sensation is intact to light touch throughout Motor: Strength is at least antigravity x4.  Movement examination: Tone: There is normal tone in the UE/LE Abnormal movements: there is RUE >> left upper extremity postural and intention tremor.  It is moderate on the right and mild to moderate on the left. Coordination:  There is no decremation with RAM's  I have reviewed and interpreted the following labs independently   Chemistry       Component Value Date/Time   NA 139 03/22/2017 1632   K 4.1 03/22/2017 1632   CL 106 03/22/2017 1632   CO2 26 03/22/2017 1632   BUN 8 03/22/2017 1632   CREATININE 0.99 03/22/2017 1632      Component Value Date/Time   CALCIUM 8.9 03/22/2017 1632      Lab Results  Component Value Date   WBC 6.2 03/22/2017   HGB 14.3 03/22/2017   HCT 42.1 03/22/2017   MCV 93.6 03/22/2017   PLT 202 03/22/2017   Lab Results  Component Value Date   TSH 2.36 08/11/2018     Chemistry      Component Value Date/Time   NA 139 03/22/2017 1632   K 4.1 03/22/2017 1632   CL 106 03/22/2017 1632   CO2 26 03/22/2017 1632   BUN 8 03/22/2017 1632   CREATININE 0.99 03/22/2017 1632      Component Value Date/Time   CALCIUM 8.9 03/22/2017 1632         Total time spent on today's visit was 23 minutes, including both face-to-face time and nonface-to-face time.  Time included that spent on review of records (prior notes available to me/labs/imaging if pertinent), discussing treatment and goals, answering patient's questions and coordinating care.  Cc:  Tisovec, Fransico Him, MD

## 2021-08-15 ENCOUNTER — Ambulatory Visit: Payer: POS | Admitting: Neurology

## 2021-08-15 ENCOUNTER — Encounter: Payer: Self-pay | Admitting: Neurology

## 2021-08-15 VITALS — BP 102/68 | HR 67 | Ht 69.0 in | Wt 168.2 lb

## 2021-08-15 DIAGNOSIS — Z720 Tobacco use: Secondary | ICD-10-CM

## 2021-08-15 DIAGNOSIS — G250 Essential tremor: Secondary | ICD-10-CM | POA: Diagnosis not present

## 2021-08-18 ENCOUNTER — Ambulatory Visit: Payer: POS | Admitting: Neurology

## 2021-08-25 ENCOUNTER — Other Ambulatory Visit: Payer: Self-pay | Admitting: Neurology

## 2021-08-25 DIAGNOSIS — G250 Essential tremor: Secondary | ICD-10-CM

## 2021-11-11 ENCOUNTER — Inpatient Hospital Stay (HOSPITAL_COMMUNITY)
Admission: EM | Admit: 2021-11-11 | Discharge: 2021-11-18 | DRG: 541 | Disposition: A | Discharge status: Rehabilitation Facility | Payer: Medicare Other | Attending: Internal Medicine | Admitting: Internal Medicine

## 2021-11-11 ENCOUNTER — Emergency Department (HOSPITAL_COMMUNITY): Payer: Medicare Other

## 2021-11-11 ENCOUNTER — Encounter (HOSPITAL_COMMUNITY): Payer: Self-pay | Admitting: Emergency Medicine

## 2021-11-11 ENCOUNTER — Other Ambulatory Visit: Payer: Self-pay

## 2021-11-11 DIAGNOSIS — Z8249 Family history of ischemic heart disease and other diseases of the circulatory system: Secondary | ICD-10-CM

## 2021-11-11 DIAGNOSIS — N400 Enlarged prostate without lower urinary tract symptoms: Secondary | ICD-10-CM | POA: Diagnosis present

## 2021-11-11 DIAGNOSIS — Z9079 Acquired absence of other genital organ(s): Secondary | ICD-10-CM

## 2021-11-11 DIAGNOSIS — M4646 Discitis, unspecified, lumbar region: Secondary | ICD-10-CM | POA: Diagnosis not present

## 2021-11-11 DIAGNOSIS — M1990 Unspecified osteoarthritis, unspecified site: Secondary | ICD-10-CM | POA: Diagnosis present

## 2021-11-11 DIAGNOSIS — F419 Anxiety disorder, unspecified: Secondary | ICD-10-CM | POA: Diagnosis not present

## 2021-11-11 DIAGNOSIS — Z79899 Other long term (current) drug therapy: Secondary | ICD-10-CM

## 2021-11-11 DIAGNOSIS — Z9013 Acquired absence of bilateral breasts and nipples: Secondary | ICD-10-CM

## 2021-11-11 DIAGNOSIS — I861 Scrotal varices: Secondary | ICD-10-CM | POA: Diagnosis present

## 2021-11-11 DIAGNOSIS — M4626 Osteomyelitis of vertebra, lumbar region: Principal | ICD-10-CM | POA: Diagnosis present

## 2021-11-11 DIAGNOSIS — Z823 Family history of stroke: Secondary | ICD-10-CM

## 2021-11-11 DIAGNOSIS — K219 Gastro-esophageal reflux disease without esophagitis: Secondary | ICD-10-CM | POA: Diagnosis present

## 2021-11-11 DIAGNOSIS — M4640 Discitis, unspecified, site unspecified: Secondary | ICD-10-CM | POA: Diagnosis present

## 2021-11-11 DIAGNOSIS — Z8547 Personal history of malignant neoplasm of testis: Secondary | ICD-10-CM

## 2021-11-11 DIAGNOSIS — F17210 Nicotine dependence, cigarettes, uncomplicated: Secondary | ICD-10-CM | POA: Diagnosis present

## 2021-11-11 DIAGNOSIS — Z8042 Family history of malignant neoplasm of prostate: Secondary | ICD-10-CM

## 2021-11-11 DIAGNOSIS — J449 Chronic obstructive pulmonary disease, unspecified: Secondary | ICD-10-CM | POA: Diagnosis present

## 2021-11-11 DIAGNOSIS — Z83438 Family history of other disorder of lipoprotein metabolism and other lipidemia: Secondary | ICD-10-CM

## 2021-11-11 DIAGNOSIS — M48061 Spinal stenosis, lumbar region without neurogenic claudication: Secondary | ICD-10-CM | POA: Diagnosis present

## 2021-11-11 DIAGNOSIS — G250 Essential tremor: Secondary | ICD-10-CM | POA: Diagnosis not present

## 2021-11-11 DIAGNOSIS — E785 Hyperlipidemia, unspecified: Secondary | ICD-10-CM | POA: Diagnosis present

## 2021-11-11 DIAGNOSIS — Z8616 Personal history of COVID-19: Secondary | ICD-10-CM

## 2021-11-11 DIAGNOSIS — Z791 Long term (current) use of non-steroidal anti-inflammatories (NSAID): Secondary | ICD-10-CM

## 2021-11-11 DIAGNOSIS — Z923 Personal history of irradiation: Secondary | ICD-10-CM

## 2021-11-11 LAB — URINALYSIS, ROUTINE W REFLEX MICROSCOPIC
Bilirubin Urine: NEGATIVE
Glucose, UA: NEGATIVE mg/dL
Hgb urine dipstick: NEGATIVE
Ketones, ur: NEGATIVE mg/dL
Leukocytes,Ua: NEGATIVE
Nitrite: NEGATIVE
Protein, ur: NEGATIVE mg/dL
Specific Gravity, Urine: 1.006 (ref 1.005–1.030)
pH: 6 (ref 5.0–8.0)

## 2021-11-11 LAB — CBC WITH DIFFERENTIAL/PLATELET
Abs Immature Granulocytes: 0.05 10*3/uL (ref 0.00–0.07)
Basophils Absolute: 0 10*3/uL (ref 0.0–0.1)
Basophils Relative: 0 %
Eosinophils Absolute: 0 10*3/uL (ref 0.0–0.5)
Eosinophils Relative: 0 %
HCT: 41.7 % (ref 39.0–52.0)
Hemoglobin: 14.2 g/dL (ref 13.0–17.0)
Immature Granulocytes: 1 %
Lymphocytes Relative: 10 %
Lymphs Abs: 1 10*3/uL (ref 0.7–4.0)
MCH: 32.3 pg (ref 26.0–34.0)
MCHC: 34.1 g/dL (ref 30.0–36.0)
MCV: 94.8 fL (ref 80.0–100.0)
Monocytes Absolute: 0.4 10*3/uL (ref 0.1–1.0)
Monocytes Relative: 4 %
Neutro Abs: 8.8 10*3/uL — ABNORMAL HIGH (ref 1.7–7.7)
Neutrophils Relative %: 85 %
Platelets: 213 10*3/uL (ref 150–400)
RBC: 4.4 MIL/uL (ref 4.22–5.81)
RDW: 13.5 % (ref 11.5–15.5)
WBC: 10.3 10*3/uL (ref 4.0–10.5)
nRBC: 0 % (ref 0.0–0.2)

## 2021-11-11 LAB — BASIC METABOLIC PANEL
Anion gap: 7 (ref 5–15)
BUN: 10 mg/dL (ref 8–23)
CO2: 26 mmol/L (ref 22–32)
Calcium: 8.9 mg/dL (ref 8.9–10.3)
Chloride: 108 mmol/L (ref 98–111)
Creatinine, Ser: 0.99 mg/dL (ref 0.61–1.24)
GFR, Estimated: >60 mL/min (ref >60)
Glucose, Bld: 117 mg/dL — ABNORMAL HIGH (ref 70–99)
Potassium: 4.1 mmol/L (ref 3.5–5.1)
Sodium: 141 mmol/L (ref 135–145)

## 2021-11-11 MED ORDER — HYDROMORPHONE HCL 1 MG/ML IJ SOLN
0.5000 mg | Freq: Once | INTRAMUSCULAR | Status: AC
  Administered 2021-11-11: 0.5 mg via INTRAMUSCULAR
  Filled 2021-11-11: qty 1

## 2021-11-11 MED ORDER — PRIMIDONE 50 MG PO TABS
100.0000 mg | ORAL_TABLET | Freq: Every day | ORAL | Status: DC
  Administered 2021-11-12 – 2021-11-17 (×6): 100 mg via ORAL
  Filled 2021-11-11 (×7): qty 2

## 2021-11-11 MED ORDER — GADOBUTROL 1 MMOL/ML IV SOLN
7.5000 mL | Freq: Once | INTRAVENOUS | Status: AC | PRN
Start: 2021-11-11 — End: 2021-11-11
  Administered 2021-11-11: 7.5 mL via INTRAVENOUS

## 2021-11-11 MED ORDER — OMEPRAZOLE MAGNESIUM 20 MG PO TBEC: 20.0000 mg | DELAYED_RELEASE_TABLET | Freq: Every day | ORAL | Status: DC

## 2021-11-11 MED ORDER — PRIMIDONE 50 MG PO TABS
150.0000 mg | ORAL_TABLET | Freq: Two times a day (BID) | ORAL | Status: DC
  Administered 2021-11-11 – 2021-11-18 (×14): 150 mg via ORAL
  Filled 2021-11-11 (×14): qty 3

## 2021-11-11 MED ORDER — HYDROMORPHONE HCL 1 MG/ML IJ SOLN
0.5000 mg | INTRAMUSCULAR | Status: DC | PRN
  Administered 2021-11-12: 0.5 mg via INTRAVENOUS
  Administered 2021-11-12 – 2021-11-14 (×12): 1 mg via INTRAVENOUS
  Filled 2021-11-11 (×13): qty 1

## 2021-11-11 MED ORDER — DICLOFENAC EPOLAMINE 1.3 % EX PTCH
1.0000 | MEDICATED_PATCH | Freq: Two times a day (BID) | CUTANEOUS | Status: DC
  Administered 2021-11-11 – 2021-11-18 (×15): 1 via TRANSDERMAL
  Filled 2021-11-11 (×16): qty 1

## 2021-11-11 MED ORDER — SERTRALINE HCL 50 MG PO TABS
50.0000 mg | ORAL_TABLET | Freq: Every day | ORAL | Status: DC
  Administered 2021-11-12 – 2021-11-18 (×7): 50 mg via ORAL
  Filled 2021-11-11 (×7): qty 1

## 2021-11-11 MED ORDER — CLONAZEPAM 0.125 MG PO TBDP
0.2500 mg | ORAL_TABLET | Freq: Every day | ORAL | Status: DC
  Administered 2021-11-11 – 2021-11-17 (×7): 0.25 mg via ORAL
  Filled 2021-11-11 (×7): qty 2

## 2021-11-11 MED ORDER — HYDROMORPHONE HCL 1 MG/ML IJ SOLN: 0.5000 mg | INTRAMUSCULAR | Status: DC | PRN

## 2021-11-11 MED ORDER — SODIUM CHLORIDE 0.9 % IV BOLUS
1000.0000 mL | Freq: Once | INTRAVENOUS | Status: AC
  Administered 2021-11-11: 1000 mL via INTRAVENOUS

## 2021-11-11 MED ORDER — ACETAMINOPHEN 325 MG PO TABS
650.0000 mg | ORAL_TABLET | Freq: Four times a day (QID) | ORAL | Status: DC | PRN
  Administered 2021-11-18: 650 mg via ORAL
  Filled 2021-11-11: qty 2

## 2021-11-11 MED ORDER — ONDANSETRON HCL 4 MG/2ML IJ SOLN
4.0000 mg | Freq: Four times a day (QID) | INTRAMUSCULAR | Status: DC | PRN
  Administered 2021-11-12: 4 mg via INTRAVENOUS
  Filled 2021-11-11: qty 2

## 2021-11-11 MED ORDER — DEXTROSE 5 % IV SOLN
500.0000 mg | Freq: Four times a day (QID) | INTRAVENOUS | Status: DC | PRN
Start: 2021-11-11 — End: 2021-11-17
  Administered 2021-11-13: 500 mg via INTRAVENOUS
  Filled 2021-11-11: qty 500

## 2021-11-11 MED ORDER — ONDANSETRON HCL 4 MG PO TABS: 4.0000 mg | ORAL_TABLET | Freq: Four times a day (QID) | ORAL | Status: DC | PRN

## 2021-11-11 MED ORDER — ROSUVASTATIN CALCIUM 10 MG PO TABS
20.0000 mg | ORAL_TABLET | Freq: Every day | ORAL | Status: DC
  Administered 2021-11-12 – 2021-11-15 (×4): 20 mg via ORAL
  Filled 2021-11-11 (×4): qty 2

## 2021-11-11 MED ORDER — PANTOPRAZOLE SODIUM 40 MG PO TBEC
40.0000 mg | DELAYED_RELEASE_TABLET | Freq: Every day | ORAL | Status: DC
  Administered 2021-11-12 – 2021-11-18 (×7): 40 mg via ORAL
  Filled 2021-11-11 (×7): qty 1

## 2021-11-11 MED ORDER — DEXAMETHASONE SODIUM PHOSPHATE 10 MG/ML IJ SOLN
10.0000 mg | Freq: Once | INTRAMUSCULAR | Status: AC
Start: 2021-11-11 — End: 2021-11-11
  Administered 2021-11-11: 10 mg via INTRAMUSCULAR
  Filled 2021-11-11: qty 1

## 2021-11-11 MED ORDER — HYDROMORPHONE HCL 1 MG/ML IJ SOLN
1.0000 mg | Freq: Once | INTRAMUSCULAR | Status: AC
  Administered 2021-11-11: 1 mg via INTRAVENOUS
  Filled 2021-11-11: qty 1

## 2021-11-11 MED ORDER — OXYCODONE HCL 5 MG PO TABS
5.0000 mg | ORAL_TABLET | ORAL | Status: DC | PRN
  Administered 2021-11-12 – 2021-11-13 (×3): 5 mg via ORAL
  Filled 2021-11-11 (×3): qty 1

## 2021-11-11 NOTE — ED Triage Notes (Addendum)
Pt BIB EMS from home, c/o severe back pain. PER EMS, pt was cutting wood with mechanical wood splitter. He felt a crack, back pain got progressively worse over the course of week. Unable to walk d/t pain. Friday he was seen by orthopedic urgent care who dx degenerative disk and scoliosis. Treated with prednisone. Pt received 200 mcg Fentanyl in route with no relief. Hx of tremors and speech impediment.   BP 138/64 P 70 spO2 99% RA

## 2021-11-11 NOTE — ED Provider Notes (Signed)
Accepted handoff at shift change from [facility], [place]. Please see prior provider note for more detail.   Briefly: Patient is 71 y.o.   DDX: concern for spinal abscess, disc impingement, discitis, spinal stenosis  Plan: f/u lumbar spine MRI possible consult neurosurgery and/or ortho      RISR  EDTHIS  Physical Exam  BP (!) 144/69   Pulse (!) 55   Temp 97.8 F (36.6 C) (Oral)   Resp 16   Ht '5\' 9"'$  (1.753 m)   Wt 75.8 kg   SpO2 97%   BMI 24.66 kg/m   Physical Exam  Procedures  Procedures  ED Course / MDM   Clinical Course as of 11/11/21 2150  Sat Nov 11, 2021  2148 MRI revealed concern for osteomyelitis of the disc between L2 and L3.  Consulted neurosurgery who advised to admit to hospital team for ongoing management.  Recommended that ID be involved for possible infection.  Also recommended possible IR bone biopsy if indicated.  Hospital team accepted for admission. [JR]    Clinical Course User Index [JR] Harriet Pho, PA-C   Medical Decision Making Amount and/or Complexity of Data Reviewed Labs: ordered. Radiology: ordered.  Risk Prescription drug management.         Harriet Pho, PA-C 11/11/21 Orvilla Cornwall, MD 11/15/21 [phone]

## 2021-11-11 NOTE — H&P (Signed)
History and Physical    Cameron [name] HBZ:[id number] DOB: February 27, 1951 DOA: 11/11/2021  PCP: Haywood Pao, MD   Patient coming from: Home   Chief Complaint: Back pain   HPI: Cameron [name] is a very pleasant 71 y.o. male with medical history significant for testicular cancer status postresection and radiation, BPH, anxiety, and tremor, who presented to the emergency department with severe back pain.  Patient was splitting and stacking wood on 11/04/2021 and was experiencing low back pain the following day that he attributed to the work he had been doing.  Pain progressively worsened, eventually prompting him to seek evaluation at an orthopedic clinic yesterday where he was started on steroid and muscle relaxer.  Unfortunately, pain continued to worsen and he is unable to ambulate due to the severe pain.    Pain is severe, radiating from the low back down the right leg, worse with any movement, and without any alleviating factors identified.  He denies any lower extremity numbness or weakness, denies fevers or chills, and denies change in bowel or bladder habits.  ED Course: Upon arrival to the ED, patient is found to be afebrile and saturating well on room air with stable blood pressure.  Chemistry panel and CBC are unremarkable.  MRI of the lumbar spine is concerning for early discitis osteomyelitis and L2-3 without discrete abscess.  Patient was given a liter of saline, Dilaudid, Decadron, diclofenac in the emergency department.  ED PA discussed the case with neurosurgery who did not feel that they needed to be involved at this point.  Review of Systems:  All other systems reviewed and apart from HPI, are negative.  Past Medical History:  Diagnosis Date   Arthritis    Benign essential tremor    BPH (benign prostatic hyperplasia)    Concussion 1968   no residual from   COPD (chronic obstructive pulmonary disease) (HCC)    no inhalers   COVID 03/04/2020   runny nose  at times   loss of taste and smell still present   GERD (gastroesophageal reflux disease)    History of hiatal hernia    small per dr Osborne Casco   History of testicular cancer 2005   right testicle removed and 15 radiation tx   Hx of gynecomastia    due to hcg shots for male infertility   Insomnia    Nocturia    Tobacco abuse    Varicocele    Wears glasses    for reading    Past Surgical History:  Procedure Laterality Date   APPENDECTOMY  1965   open   MASTECTOMY     1 sx for each breast enalrgement sx was 10 yrs apaprt   ORCHIECTOMY  2005   right   surgery for undescended testicle  age 35   right   THULIUM LASER TURP (TRANSURETHRAL RESECTION OF PROSTATE) N/A 06/10/2020   Procedure: THULIUM LASER TURP (TRANSURETHRAL RESECTION OF PROSTATE);  Surgeon: Festus Aloe, MD;  Location: [facility]  Service: Urology;  Laterality: N/A;   varicocoele  1983 or 1984    Social History:   reports that he has been smoking cigarettes. He has a 60.00 pack-year smoking history. His smokeless tobacco use includes chew. He reports that he does not currently use alcohol. He reports that he does not use drugs.  No Known Allergies  Family History  Problem Relation Age of Onset   Prostate cancer Father    Stroke Mother    Prostate cancer  Brother    Hypertension Sister    High Cholesterol Sister    Hypertension Sister    Memory loss Sister      Prior to Admission medications   Medication Sig Start Date End Date Taking? Authorizing Provider  Cholecalciferol (VITAMIN D-3) 125 MCG (5000 UT) TABS Take 1 tablet by mouth daily.   Yes [provider]  clonazePAM (KLONOPIN) 0.5 MG tablet Take 0.25 mg by mouth at bedtime.   Yes [provider]  Coenzyme Q10 (CO Q 10 PO) Take 1 tablet by mouth daily.   Yes [provider]  meclizine (ANTIVERT) 25 MG tablet Take 25 mg by mouth 4 (four) times daily as needed for dizziness. 08/09/21  Yes [provider]   meloxicam (MOBIC) 7.5 MG tablet Take 7.5 mg by mouth daily. 03/05/17  Yes [provider]  omeprazole (PRILOSEC OTC) 20 MG tablet Take 20 mg by mouth daily.   Yes [provider]  predniSONE (STERAPRED UNI-PAK 21 TAB) 10 MG (21) TBPK tablet Take 10-60 mg by mouth as directed. Take 6 tablets on Day 1 Take 5 tablets on Day 2 Take 4 tablets on Day 3 Take 3 tablets on Day 4 Take 2 tablets on Day 5 Take 1 tablet on Day 6 11/10/21  Yes [provider]  primidone (MYSOLINE) 50 MG tablet TAKE 3 TABLETS BY MOUTH EVERY MORNING, 2 TABLET IN THE AFTERNOON AND 3 IN THE EVENING Patient taking differently: Take 100-150 mg by mouth as directed. TAKE 3 TABLETS BY MOUTH EVERY MORNING, 2 TABLET IN THE AFTERNOON AND 3 IN THE EVENING 08/25/21  Yes Tat, Wells Guiles S, DO  rosuvastatin (CRESTOR) 20 MG tablet TAKE 1 TABLET BY MOUTH EVERY DAY 12/28/20  Yes Adrian Prows, MD  sertraline (ZOLOFT) 50 MG tablet Take 50 mg by mouth daily. 03/04/17  Yes [provider]    Physical Exam: Vitals:   11/11/21 2030 11/11/21 2115 11/11/21 2130 11/11/21 2133  BP: (!) 141/73   136/82  Pulse: (!) 57 67 61 62  Resp: 16   16  Temp:      TempSrc:      SpO2: 96% 96% 96% 97%  Weight:      Height:        Constitutional: NAD, appears uncomfortable   Eyes: PERTLA, lids and conjunctivae normal ENMT: Mucous membranes are moist. Posterior pharynx clear of any exudate or lesions.   Neck: supple, no masses  Respiratory:  no wheezing, no crackles. No accessory muscle use.  Cardiovascular: S1 & S2 heard, regular rate and rhythm. No extremity edema.   Abdomen: No distension, no tenderness, soft. Bowel sounds active.  Musculoskeletal: no clubbing / cyanosis. No joint deformity upper and lower extremities.   Skin: no significant rashes, lesions, ulcers. Warm, dry, well-perfused. Neurologic: CN 2-12 grossly intact. Sensation intact. Strength 5/5 in all 4 limbs. Alert and oriented.  Psychiatric: Pleasant.  Cooperative.    Labs and Imaging on Admission: I have personally reviewed following labs and imaging studies  CBC: Recent Labs  Lab 11/11/21 1540  WBC 10.3  NEUTROABS 8.8*  HGB 14.2  HCT 41.7  MCV 94.8  PLT 876   Basic Metabolic Panel: Recent Labs  Lab 11/11/21 1540  NA 141  K 4.1  CL 108  CO2 26  GLUCOSE 117*  BUN 10  CREATININE 0.99  CALCIUM 8.9   GFR: Estimated Creatinine Clearance: 71.4 mL/min (by C-G formula based on SCr of 0.99 mg/dL). Liver Function Tests: No results for  input(s): "AST", "ALT", "ALKPHOS", "BILITOT", "PROT", "ALBUMIN" in the last 168 hours. No results for input(s): "LIPASE", "AMYLASE" in the last 168 hours. No results for input(s): "AMMONIA" in the last 168 hours. Coagulation Profile: No results for input(s): "INR", "PROTIME" in the last 168 hours. Cardiac Enzymes: No results for input(s): "CKTOTAL", "CKMB", "CKMBINDEX", "TROPONINI" in the last 168 hours. BNP (last 3 results) No results for input(s): "PROBNP" in the last 8760 hours. HbA1C: No results for input(s): "HGBA1C" in the last 72 hours. CBG: No results for input(s): "GLUCAP" in the last 168 hours. Lipid Profile: No results for input(s): "CHOL", "HDL", "LDLCALC", "TRIG", "CHOLHDL", "LDLDIRECT" in the last 72 hours. Thyroid Function Tests: No results for input(s): "TSH", "T4TOTAL", "FREET4", "T3FREE", "THYROIDAB" in the last 72 hours. Anemia Panel: No results for input(s): "VITAMINB12", "FOLATE", "FERRITIN", "TIBC", "IRON", "RETICCTPCT" in the last 72 hours. Urine analysis: No results found for: "COLORURINE", "APPEARANCEUR", "LABSPEC", "PHURINE", "GLUCOSEU", "HGBUR", "BILIRUBINUR", "KETONESUR", "PROTEINUR", "UROBILINOGEN", "NITRITE", "LEUKOCYTESUR" Sepsis Labs: '@LABRCNTIP'$ (procalcitonin:4,lacticidven:4) )No results found for this or any previous visit (from the past 240 hour(s)).   Radiological Exams on Admission: MR Lumbar Spine W Wo Contrast  Result Date: 11/11/2021 CLINICAL  DATA:  Lumbar radiculopathy. Infection suspected. Progressive symptoms over the last week. EXAM: MRI LUMBAR SPINE WITHOUT AND WITH CONTRAST TECHNIQUE: Multiplanar and multiecho pulse sequences of the lumbar spine were obtained without and with intravenous contrast. CONTRAST:  7.90m GADAVIST GADOBUTROL 1 MMOL/ML IV SOLN COMPARISON:  None Available. FINDINGS: Segmentation: 5 non rib-bearing lumbar type vertebral bodies are present. The lowest fully formed vertebral body is L5. Alignment: Slight retrolisthesis at L2-3 is worse on the left. No other significant listhesis present. Straightening of the normal lumbar lordosis is present. Levoconvex curvature is present at L2-3. Vertebrae: Sclerotic changes are present anteriorly on the right at L2-3. Fluid is present in the L2-3 disc space. There is some enhancement within the disc space and mild endplate enhancement at L2-3. Subtle enhancement of right paravertebral soft tissues is present as well. Edematous changes are present on the left at L4-5 with some enhancement. No enhancement is present in the disc space. Conus medullaris and cauda equina: Conus extends to the T12-L1 level. Conus and cauda equina appear normal. Paraspinal and other soft tissues: Limited imaging the abdomen is unremarkable. There is no significant adenopathy. No solid organ lesions are present. Disc levels: T12-L1: Negative. L1-2: A broad-based disc protrusion is present. Mild facet hypertrophy is noted. Mild foraminal stenosis is present bilaterally. L2-3: Rightward disc protrusion results in mild right subarticular stenosis. Moderate to severe right foraminal stenosis is present. Moderate left foraminal stenosis is present. L3-4: A broad-based disc protrusion is present. Moderate facet hypertrophy is present bilaterally. Central canal is patent. Moderate left and mild right foraminal narrowing is present. L4-5: A broad-based disc protrusion is present. Chronic loss of height is present on the  left. Moderate to severe left subarticular and foraminal narrowing is present. Mild to moderate right subarticular and foraminal narrowing is present. L5-S1: A shallow central disc protrusion is present. No significant stenosis is present. IMPRESSION: 1. Fluid in the L2-3 disc space with some enhancement within the disc space and mild endplate enhancement. This is concerning for early disc osteomyelitis. 2. Mild right paravertebral soft tissue enhancement is present as well consistent with inflammatory change. No discrete abscess is present. 3. While there is no definite enhancement in the disc space, there is some left-sided endplate marrow edema and enhancement at L4-5. This most likely represents degenerative change in the  setting of left-sided disease. 4. Mild foraminal narrowing bilaterally at L1-2. 5. Moderate to severe right and moderate left foraminal stenosis at L2-3. 6. Moderate left and mild right foraminal stenosis at L3-4. 7. Moderate to severe left subarticular and foraminal stenosis at L4-5. 8. Mild to moderate right subarticular and foraminal narrowing at L4-5. 9. Shallow central disc protrusion at L5-S1 without significant stenosis. These results were called by telephone at the time of interpretation on 11/11/2021 at 8:34 pm to provider Jolayne Panther, PA, who verbally acknowledged these results. Electronically Signed   By: San Morelle M.D.   On: 11/11/2021 20:34    Assessment/Plan  1. Intractable back pain; ?vertebral osteomyelitis  - Presents with severe and worsening back pain and has MRI findings concerning for early discitis osteomyelitis at L2-3  - He is not septic and there is no abscess on MRI, no cord compression, and no focal deficit  - Continue pain-control, consult IR for consideration of biopsy for culture and histopathology    2. Anxiety  - Continue Zoloft and Klonopin   3. Tremor  - Continue Primidone    DVT prophylaxis: SCDs  Code Status: Full  Level of Care:   Level of care: Med-Surg Family Communication: wife at bedside  Disposition Plan:  Patient is from: home  Anticipated d/c is to: TBD Anticipated d/c date is: Possibly as early as 11/12/21  Patient currently: Pending pain-control, transition to oral medications, IR consultation  Consults called: none  Admission status: Observation     Vianne Bulls, MD [facility]  11/11/2021, 10:39 PM

## 2021-11-11 NOTE — ED Provider Notes (Signed)
[place] DEPT Provider Note   CSN: [id number] Arrival date & time: 11/11/21  1344     History  Chief Complaint  Patient presents with   Back Pain    HAVOC SANLUIS is a 71 y.o. male with hx of hyperlipidemia, aortic atherosclerosis, hiatal hernia, BPH, varicocele, chronic tobacco use, COPD, GERD.  Essential tremor at baseline.  Presenting today with severe right-sided low back pain.  Saturday last week he was outside splitting wood with a mechanical wood splitter.  No pain or tenderness the day of the activity, developed the day after with some mild soreness.  Symptoms continue to worsen.  Now with extreme back pain radiating from the middle of the spine down the right leg.  Seen by [facility] yesterday, x-rays done.  Told he was had scoliosis and degenerative disc disease, no evidence of fracture or dislocation.  Provided short course of steroids and muscle relaxants.  Took 6 steroid tabs yesterday and 5 today.  Also took 2 Vicodin from prior dental surgery earlier this morning with no relief.  Given 200 mcg fentanyl in route to the ED, with no relief.  Denies lower extremity weakness, tingling, or numbness.  Denies urinary or bowel incontinence.  Denies fever, IV drug use, hemoptysis, recent malignancy.  Hx of tremors and speech impediment at baseline.  The history is provided by the patient and medical records.      Home Medications Prior to Admission medications   Medication Sig Start Date End Date Taking? Authorizing Provider  Cholecalciferol (VITAMIN D-3) 125 MCG (5000 UT) TABS Take 1 tablet by mouth daily.    [provider]  clonazePAM (KLONOPIN) 0.5 MG tablet Take 0.25 mg by mouth at bedtime.    [provider]  Coenzyme Q10 (CO Q 10 PO) Take 1 tablet by mouth daily.    [provider]  meclizine (ANTIVERT) 25 MG tablet Take 25 mg by mouth 4 (four) times daily as needed. 08/09/21   [provider]  meloxicam  (MOBIC) 7.5 MG tablet Take 7.5 mg by mouth daily. 03/05/17   [provider]  omeprazole (PRILOSEC OTC) 20 MG tablet Take 20 mg by mouth daily.    [provider]  primidone (MYSOLINE) 50 MG tablet TAKE 3 TABLETS BY MOUTH EVERY MORNING, 2 TABLET IN THE AFTERNOON AND 3 IN THE EVENING 08/25/21   Tat, Eustace Quail, DO  rosuvastatin (CRESTOR) 20 MG tablet TAKE 1 TABLET BY MOUTH EVERY DAY 12/28/20   Adrian Prows, MD  sertraline (ZOLOFT) 50 MG tablet Take 50 mg by mouth daily. 03/04/17   [provider]      Allergies    Patient has no known allergies.    Review of Systems   Review of Systems  Musculoskeletal:  Positive for back pain.    Physical Exam Updated Vital Signs BP (!) 140/77 (BP Location: Right Arm)   Pulse 60   Temp 97.9 F (36.6 C) (Oral)   Resp (!) 22   Ht '5\' 9"'$  (1.753 m)   Wt 75.8 kg   SpO2 98%   BMI 24.66 kg/m  Physical Exam Vitals and nursing note reviewed.  Constitutional:      General: He is not in acute distress.    Appearance: He is well-developed. He is not ill-appearing, toxic-appearing or diaphoretic.     Comments: Appears uncomfortable  HENT:     Head: Normocephalic and atraumatic.  Eyes:     Conjunctiva/sclera: Conjunctivae normal.  Cardiovascular:  Rate and Rhythm: Normal rate and regular rhythm.     Heart sounds: No murmur heard. Pulmonary:     Effort: Pulmonary effort is normal. No respiratory distress.     Breath sounds: Normal breath sounds.  Abdominal:     Palpations: Abdomen is soft.     Tenderness: There is no abdominal tenderness.  Musculoskeletal:        General: No swelling.     Cervical back: Neck supple.       Back:     Comments: Midline low back pain with radiation through the right side down the leg.  Pain appears out of proportion on exam.    Skin:    General: Skin is warm and dry.     Capillary Refill: Capillary refill takes less than 2 seconds.  Neurological:     Mental Status: He is alert and oriented  to person, place, and time.     GCS: GCS eye subscore is 4. GCS verbal subscore is 5. GCS motor subscore is 6.     Sensory: No sensory deficit.     Motor: No weakness.     Deep Tendon Reflexes: Reflexes normal.     Comments: Unable to assess gait due to current state of pain.  No saddle anesthesia.  Patellar and Achilles reflexes 2+ bilaterally.  Positive SLR right, negative left.  Sensation appears grossly intact of lower extremities.  Reduced ROM and strength of right lower extremity due to elicited pain.  Psychiatric:        Mood and Affect: Mood normal.     ED Results / Procedures / Treatments   Labs (all labs ordered are listed, but only abnormal results are displayed) Labs Reviewed  BASIC METABOLIC PANEL  CBC WITH DIFFERENTIAL/PLATELET    EKG None  Radiology No results found.  Procedures Procedures    Medications Ordered in ED Medications  HYDROmorphone (DILAUDID) injection 0.5 mg (has no administration in time range)  diclofenac (FLECTOR) 1.3 % 1 patch (has no administration in time range)  dexamethasone (DECADRON) injection 10 mg (has no administration in time range)    ED Course/ Medical Decision Making/ A&P Clinical Course as of 11/14/21 0915  Sat Nov 11, 2021  2148 MRI revealed concern for osteomyelitis of the disc between L2 and L3.  Consulted neurosurgery who advised to admit to hospital team for ongoing management.  Recommended that ID be involved for possible infection.  Also recommended possible IR bone biopsy if indicated.  Hospital team accepted for admission. [JR]    Clinical Course User Index [JR] Harriet Pho, PA-C                           Medical Decision Making Amount and/or Complexity of Data Reviewed Labs: ordered. Radiology: ordered.  Risk Prescription drug management. Decision regarding hospitalization.   71 y.o. male presents to the ED for concern of Back Pain   This involves an extensive number of treatment options, and is a  complaint that carries with it a high risk of complications and morbidity.  The emergent differential diagnosis prior to evaluation includes, but is not limited to: discitis, spinal abscess, HNP, cauda equina, muscle spasm, fracture  This is not an exhaustive differential.   Past Medical History / Co-morbidities / Social History: Hx of hyperlipidemia, aortic atherosclerosis, hiatal hernia, BPH, varicocele, chronic tobacco use, COPD, GERD.  Essential tremor at baseline. Social Determinants of Health include: Elderly; chronic tobacco use, for  cessation counseling was provided  Additional History:  Obtained by chart review.  Notably [facility] visit yesterday "I discussed with the patient and his wife today that I believe the patient's pain is coming from his SI joint dysfunction though the patient does have a fair bit of degenerative spine changes. Patient has no midline tenderness on exam. We will go and start the patient on oral steroid. Patient states he has taken these in the past without any significant issues. We have also sent in the patient a muscle relaxer to see if this helps with some of his discomfort and I encouraged the patient to get lidocaine patches at the pharmacy for pain control. Patient was also encouraged to use heat and rest as discussed. Patient will follow-up with one of our sports medicine doctors in 1 week for further evaluation. ER precautions given. All questions and concerns answered at this visit."  Lab Tests: I ordered, and personally interpreted labs.  The pertinent results include:   WBC 10.3 with mild elevated neutrophil count 8.8 BMP unremarkable  Imaging Studies: I ordered imaging studies including MRI lumbar -- Pending  ED Course: Pt appears uncomfortable on exam with pain out of proportion.  Presenting with acute worsening right sided low back pain.  Patient believes this is due to an possible injury from using an automatic wood splitter about a week ago.   No pain on the day of the activity.  However mild soreness appeared the next day, and has continued to increase until today.  Yesterday seen by orthopedics.  They started him on a short course of steroids which provided no relief.  Took 2 tabs of Vicodin this morning and given 200 mcg fentanyl by EMS in route to ED.  These provided no relief as well, still describes pain is excruciating.  ROM appears significantly limited due to level of discomfort.  Without recent fevers, IVDU, hemoptysis, or malignancy.  Without saddle anesthesia or urinary/bowel incontinence.  Lower extremities appear neurovascularly intact.  However with remarkable midline tenderness and significant right lower back tenderness radiating through to the buttock.  Positive right-sided SLR.  Though symptoms began to worsen the day after using an electronic wood splitter, patient did not do much heavy lifting or twisting on that day.  This may be coincidental.  Fluid bolus provided.  Decadron and Dilaudid provided for assistance in pain management, in addition to diclofenac patch.  Due to the patient's level of excruciating pain, plan to assess with MRI and reassess.  Disposition: 1915 care of JAMIS KRYDER transferred to PA Joanette Gula at the end of my shift as the patient will require reassessment once labs/imaging have resulted.  Patient presentation, ED course, and plan of care discussed with review of all pertinent labs and imaging.  Please see his/her note for further details regarding further ED course and disposition.  Plan at time of handoff is dependent on MRI imaging.  May consider consult with neurosurgery.  Differential still wide, plan to accordingly.    I discussed this case with my attending physician Dr. Vanita Panda, who agreed with the proposed treatment course and cosigned this note including patient's presenting symptoms, physical exam, and planned diagnostics and interventions.  Attending physician stated agreement with  plan or made changes to plan which were implemented.     This chart was dictated using voice recognition software.  Despite best efforts to proofread, errors can occur which can change the documentation meaning.         Final  Clinical Impression(s) / ED Diagnoses Final diagnoses:  None    Rx / DC Orders ED Discharge Orders     None         Prince Rome, Hershal Coria 35/70/17 [id number]    Gareth Morgan, MD 11/17/21 1319

## 2021-11-12 ENCOUNTER — Encounter (HOSPITAL_COMMUNITY): Payer: Self-pay | Admitting: Internal Medicine

## 2021-11-12 DIAGNOSIS — K5903 Drug induced constipation: Secondary | ICD-10-CM | POA: Diagnosis not present

## 2021-11-12 DIAGNOSIS — Z823 Family history of stroke: Secondary | ICD-10-CM | POA: Diagnosis not present

## 2021-11-12 DIAGNOSIS — M4646 Discitis, unspecified, lumbar region: Secondary | ICD-10-CM | POA: Diagnosis present

## 2021-11-12 DIAGNOSIS — Z923 Personal history of irradiation: Secondary | ICD-10-CM | POA: Diagnosis not present

## 2021-11-12 DIAGNOSIS — J440 Chronic obstructive pulmonary disease with acute lower respiratory infection: Secondary | ICD-10-CM | POA: Diagnosis not present

## 2021-11-12 DIAGNOSIS — F419 Anxiety disorder, unspecified: Secondary | ICD-10-CM | POA: Diagnosis present

## 2021-11-12 DIAGNOSIS — K219 Gastro-esophageal reflux disease without esophagitis: Secondary | ICD-10-CM | POA: Diagnosis present

## 2021-11-12 DIAGNOSIS — D696 Thrombocytopenia, unspecified: Secondary | ICD-10-CM | POA: Diagnosis present

## 2021-11-12 DIAGNOSIS — N50819 Testicular pain, unspecified: Secondary | ICD-10-CM | POA: Diagnosis not present

## 2021-11-12 DIAGNOSIS — E782 Mixed hyperlipidemia: Secondary | ICD-10-CM | POA: Diagnosis present

## 2021-11-12 DIAGNOSIS — G4700 Insomnia, unspecified: Secondary | ICD-10-CM | POA: Diagnosis present

## 2021-11-12 DIAGNOSIS — Z9013 Acquired absence of bilateral breasts and nipples: Secondary | ICD-10-CM | POA: Diagnosis not present

## 2021-11-12 DIAGNOSIS — R770 Abnormality of albumin: Secondary | ICD-10-CM | POA: Diagnosis not present

## 2021-11-12 DIAGNOSIS — M79604 Pain in right leg: Secondary | ICD-10-CM | POA: Diagnosis not present

## 2021-11-12 DIAGNOSIS — R471 Dysarthria and anarthria: Secondary | ICD-10-CM | POA: Diagnosis present

## 2021-11-12 DIAGNOSIS — M4640 Discitis, unspecified, site unspecified: Secondary | ICD-10-CM | POA: Diagnosis not present

## 2021-11-12 DIAGNOSIS — M4626 Osteomyelitis of vertebra, lumbar region: Secondary | ICD-10-CM | POA: Diagnosis present

## 2021-11-12 DIAGNOSIS — R410 Disorientation, unspecified: Secondary | ICD-10-CM | POA: Diagnosis not present

## 2021-11-12 DIAGNOSIS — K5900 Constipation, unspecified: Secondary | ICD-10-CM | POA: Diagnosis not present

## 2021-11-12 DIAGNOSIS — J449 Chronic obstructive pulmonary disease, unspecified: Secondary | ICD-10-CM | POA: Diagnosis present

## 2021-11-12 DIAGNOSIS — N400 Enlarged prostate without lower urinary tract symptoms: Secondary | ICD-10-CM | POA: Diagnosis present

## 2021-11-12 DIAGNOSIS — M48061 Spinal stenosis, lumbar region without neurogenic claudication: Secondary | ICD-10-CM | POA: Diagnosis present

## 2021-11-12 DIAGNOSIS — Z8547 Personal history of malignant neoplasm of testis: Secondary | ICD-10-CM | POA: Diagnosis not present

## 2021-11-12 DIAGNOSIS — Z79899 Other long term (current) drug therapy: Secondary | ICD-10-CM | POA: Diagnosis not present

## 2021-11-12 DIAGNOSIS — I861 Scrotal varices: Secondary | ICD-10-CM | POA: Diagnosis present

## 2021-11-12 DIAGNOSIS — M5416 Radiculopathy, lumbar region: Secondary | ICD-10-CM | POA: Diagnosis not present

## 2021-11-12 DIAGNOSIS — Z83438 Family history of other disorder of lipoprotein metabolism and other lipidemia: Secondary | ICD-10-CM | POA: Diagnosis not present

## 2021-11-12 DIAGNOSIS — E871 Hypo-osmolality and hyponatremia: Secondary | ICD-10-CM | POA: Diagnosis present

## 2021-11-12 DIAGNOSIS — Z8042 Family history of malignant neoplasm of prostate: Secondary | ICD-10-CM | POA: Diagnosis not present

## 2021-11-12 DIAGNOSIS — Z9079 Acquired absence of other genital organ(s): Secondary | ICD-10-CM | POA: Diagnosis not present

## 2021-11-12 DIAGNOSIS — F17219 Nicotine dependence, cigarettes, with unspecified nicotine-induced disorders: Secondary | ICD-10-CM | POA: Diagnosis not present

## 2021-11-12 DIAGNOSIS — Z8616 Personal history of COVID-19: Secondary | ICD-10-CM | POA: Diagnosis not present

## 2021-11-12 DIAGNOSIS — G250 Essential tremor: Secondary | ICD-10-CM | POA: Diagnosis present

## 2021-11-12 DIAGNOSIS — M62838 Other muscle spasm: Secondary | ICD-10-CM | POA: Diagnosis not present

## 2021-11-12 DIAGNOSIS — Z8249 Family history of ischemic heart disease and other diseases of the circulatory system: Secondary | ICD-10-CM | POA: Diagnosis not present

## 2021-11-12 DIAGNOSIS — D649 Anemia, unspecified: Secondary | ICD-10-CM | POA: Diagnosis present

## 2021-11-12 DIAGNOSIS — R5381 Other malaise: Secondary | ICD-10-CM | POA: Diagnosis present

## 2021-11-12 DIAGNOSIS — E785 Hyperlipidemia, unspecified: Secondary | ICD-10-CM | POA: Diagnosis present

## 2021-11-12 DIAGNOSIS — J189 Pneumonia, unspecified organism: Secondary | ICD-10-CM | POA: Diagnosis not present

## 2021-11-12 DIAGNOSIS — E876 Hypokalemia: Secondary | ICD-10-CM | POA: Diagnosis present

## 2021-11-12 DIAGNOSIS — F17210 Nicotine dependence, cigarettes, uncomplicated: Secondary | ICD-10-CM | POA: Diagnosis present

## 2021-11-12 DIAGNOSIS — Z791 Long term (current) use of non-steroidal anti-inflammatories (NSAID): Secondary | ICD-10-CM | POA: Diagnosis not present

## 2021-11-12 DIAGNOSIS — M1990 Unspecified osteoarthritis, unspecified site: Secondary | ICD-10-CM | POA: Diagnosis present

## 2021-11-12 DIAGNOSIS — F411 Generalized anxiety disorder: Secondary | ICD-10-CM | POA: Diagnosis not present

## 2021-11-12 LAB — CBC
HCT: 39.8 % (ref 39.0–52.0)
Hemoglobin: 13.2 g/dL (ref 13.0–17.0)
MCH: 32.4 pg (ref 26.0–34.0)
MCHC: 33.2 g/dL (ref 30.0–36.0)
MCV: 97.5 fL (ref 80.0–100.0)
Platelets: 195 10*3/uL (ref 150–400)
RBC: 4.08 MIL/uL — ABNORMAL LOW (ref 4.22–5.81)
RDW: 14 % (ref 11.5–15.5)
WBC: 10.3 10*3/uL (ref 4.0–10.5)
nRBC: 0 % (ref 0.0–0.2)

## 2021-11-12 LAB — BASIC METABOLIC PANEL
Anion gap: 4 — ABNORMAL LOW (ref 5–15)
BUN: 13 mg/dL (ref 8–23)
CO2: 26 mmol/L (ref 22–32)
Calcium: 8.7 mg/dL — ABNORMAL LOW (ref 8.9–10.3)
Chloride: 111 mmol/L (ref 98–111)
Creatinine, Ser: 0.98 mg/dL (ref 0.61–1.24)
GFR, Estimated: >60 mL/min (ref >60)
Glucose, Bld: 96 mg/dL (ref 70–99)
Potassium: 4 mmol/L (ref 3.5–5.1)
Sodium: 141 mmol/L (ref 135–145)

## 2021-11-12 LAB — SEDIMENTATION RATE
Sed Rate: 1 mm/hr (ref 0–16)
Sed Rate: 1 mm/hr (ref 0–16)

## 2021-11-12 LAB — C-REACTIVE PROTEIN
CRP: 0.5 mg/dL (ref <1.0)
CRP: <0.5 mg/dL (ref <1.0)

## 2021-11-12 NOTE — Progress Notes (Signed)
PROGRESS NOTE  Cameron [name]  OHY:[id number] DOB: Mar 28, 1950 DOA: 11/11/2021 PCP: Haywood Pao, MD   Brief Narrative:  Patient is a 71 year old male with history of testicular cancer s/p resection, radiation, BPH, anxiety, tremor who presented here with complaint of severe back pain which started after spliting/stacking of woods on 9/2.  Patient's pain progressively worsened and he was seen by orthopedics and was prescribed prednisone, muscle relaxants but his pain did not improve so he presented to the emergency department.  Patient described the pain as severe, radiating from low back to the right leg, worse with any movement.  He was found to be afebrile and hemodynamically stable on presentation.  MRI of the lumbar spine was concerning for discitis/osteomyelitis of L2-L3 without any discrete abscess.  Blood culture sent.  IR consulted for disc aspiration, plan for tomorrow.  Not started on antibiotics yet.  Assessment & Plan:  Principal Problem:   Acute osteomyelitis of lumbar spine (HCC) Active Problems:   Discitis  Intractable back pain/discitis/osteomyelitis of L2-L3:Patient described the pain as severe, radiating from low back to the right leg, worse with any movement.  He was found to be afebrile and hemodynamically stable on presentation.  MRI of the lumbar spine was concerning for discitis/osteomyelitis of L2-L3 without any discrete abscess.  Blood cultures sent.  IR consulted for disc aspiration, plan for tomorrow.  Not started on antibiotics yet. Continue pain management, supportive care  Anxiety: On Zoloft, Klonopin  History of tremor: On primidone           DVT prophylaxis:SCDs Start: 11/11/21 2140     Code Status: Full Code  Family Communication: Wife at bedside  Patient status:Inpatient  Patient is from :Home  Anticipated discharge RS:WNIO  Estimated DC date:after complete work up   Consultants: IR  Procedures:None  Antimicrobials:   Anti-infectives (From admission, onward)    None       Subjective:  Patient seen and examined the bedside today.  Wife at bedside.  His pain was not severe this morning and was tolerable but has back pain when he moves.  Lying in bed.  Afebrile Objective: Vitals:   11/11/21 2325 11/11/21 2341 11/12/21 0408 11/12/21 0740  BP: (!) 126/59 138/69 126/74 129/77  Pulse: 63 (!) 57 (!) 58 (!) 59  Resp: '14 18 16 18  '$ Temp: 97.8 F (36.6 C) 98.1 F (36.7 C) (!) 97.4 F (36.3 C) 97.6 F (36.4 C)  TempSrc: Oral Oral Oral Oral  SpO2: 98% 98% 98% 98%  Weight:      Height:        Intake/Output Summary (Last 24 hours) at 11/12/2021 1054 Last data filed at 11/11/2021 1912 Gross per 24 hour  Intake 1000 ml  Output --  Net 1000 ml   Filed Weights   11/11/21 1402  Weight: 75.8 kg    Examination:  General exam: Overall comfortable, not in distress HEENT: PERRL Respiratory system:  no wheezes or crackles  Cardiovascular system: S1 & S2 heard, RRR.  Gastrointestinal system: Abdomen is nondistended, soft and nontender. Central nervous system: Alert and oriented Extremities: No edema, no clubbing ,no cyanosis Skin: No rashes, no ulcers,no icterus     Data Reviewed: I have personally reviewed following labs and imaging studies  CBC: Recent Labs  Lab 11/11/21 1540 11/12/21 0544  WBC 10.3 10.3  NEUTROABS 8.8*  --   HGB 14.2 13.2  HCT 41.7 39.8  MCV 94.8 97.5  PLT 213 270   Basic Metabolic Panel:  Recent Labs  Lab 11/11/21 1540 11/12/21 0544  NA 141 141  K 4.1 4.0  CL 108 111  CO2 26 26  GLUCOSE 117* 96  BUN 10 13  CREATININE 0.99 0.98  CALCIUM 8.9 8.7*     No results found for this or any previous visit (from the past 240 hour(s)).   Radiology Studies: MR Lumbar Spine W Wo Contrast  Result Date: 11/11/2021 CLINICAL DATA:  Lumbar radiculopathy. Infection suspected. Progressive symptoms over the last week. EXAM: MRI LUMBAR SPINE WITHOUT AND WITH CONTRAST TECHNIQUE:  Multiplanar and multiecho pulse sequences of the lumbar spine were obtained without and with intravenous contrast. CONTRAST:  7.85m GADAVIST GADOBUTROL 1 MMOL/ML IV SOLN COMPARISON:  None Available. FINDINGS: Segmentation: 5 non rib-bearing lumbar type vertebral bodies are present. The lowest fully formed vertebral body is L5. Alignment: Slight retrolisthesis at L2-3 is worse on the left. No other significant listhesis present. Straightening of the normal lumbar lordosis is present. Levoconvex curvature is present at L2-3. Vertebrae: Sclerotic changes are present anteriorly on the right at L2-3. Fluid is present in the L2-3 disc space. There is some enhancement within the disc space and mild endplate enhancement at L2-3. Subtle enhancement of right paravertebral soft tissues is present as well. Edematous changes are present on the left at L4-5 with some enhancement. No enhancement is present in the disc space. Conus medullaris and cauda equina: Conus extends to the T12-L1 level. Conus and cauda equina appear normal. Paraspinal and other soft tissues: Limited imaging the abdomen is unremarkable. There is no significant adenopathy. No solid organ lesions are present. Disc levels: T12-L1: Negative. L1-2: A broad-based disc protrusion is present. Mild facet hypertrophy is noted. Mild foraminal stenosis is present bilaterally. L2-3: Rightward disc protrusion results in mild right subarticular stenosis. Moderate to severe right foraminal stenosis is present. Moderate left foraminal stenosis is present. L3-4: A broad-based disc protrusion is present. Moderate facet hypertrophy is present bilaterally. Central canal is patent. Moderate left and mild right foraminal narrowing is present. L4-5: A broad-based disc protrusion is present. Chronic loss of height is present on the left. Moderate to severe left subarticular and foraminal narrowing is present. Mild to moderate right subarticular and foraminal narrowing is present.  L5-S1: A shallow central disc protrusion is present. No significant stenosis is present. IMPRESSION: 1. Fluid in the L2-3 disc space with some enhancement within the disc space and mild endplate enhancement. This is concerning for early disc osteomyelitis. 2. Mild right paravertebral soft tissue enhancement is present as well consistent with inflammatory change. No discrete abscess is present. 3. While there is no definite enhancement in the disc space, there is some left-sided endplate marrow edema and enhancement at L4-5. This most likely represents degenerative change in the setting of left-sided disease. 4. Mild foraminal narrowing bilaterally at L1-2. 5. Moderate to severe right and moderate left foraminal stenosis at L2-3. 6. Moderate left and mild right foraminal stenosis at L3-4. 7. Moderate to severe left subarticular and foraminal stenosis at L4-5. 8. Mild to moderate right subarticular and foraminal narrowing at L4-5. 9. Shallow central disc protrusion at L5-S1 without significant stenosis. These results were called by telephone at the time of interpretation on 11/11/2021 at 8:34 pm to provider JJolayne Panther PA, who verbally acknowledged these results. Electronically Signed   By: CSan MorelleM.D.   On: 11/11/2021 20:34    Scheduled Meds:  clonazepam  0.25 mg Oral QHS   diclofenac  1 patch Transdermal BID  pantoprazole  40 mg Oral Daily   primidone  100 mg Oral q1600   primidone  150 mg Oral BID   rosuvastatin  20 mg Oral Daily   sertraline  50 mg Oral Daily   Continuous Infusions:  methocarbamol (ROBAXIN) IV       LOS: 0 days   Shelly Coss, MD [facility] P9/12/2021, 10:54 AM

## 2021-11-12 NOTE — Consult Note (Signed)
Chief Complaint: Patient was seen in consultation today for back pain  Referring Physician(s): Dr. Mitzi Hansen  Supervising Physician: Juliet Rude  Patient Status: [facility] - In-pt  History of Present Illness: Cameron [name] is a 71 y.o. male with past medical history of testicular cancer s/p resection, radiation, BPH, anxiety, who presented to [facility] ED with progressively worsening back pain at home.  Patient is physically active at baseline.  He was evaluated by orthopedics as an outpatient who prescribed prednisone and muscle relaxors, however patient worsened until he was essentially non-ambulatory.    MR Lumbar Spine showed: 1. Fluid in the L2-3 disc space with some enhancement within the disc space and mild endplate enhancement. This is concerning for early disc osteomyelitis. 2. Mild right paravertebral soft tissue enhancement is present as well consistent with inflammatory change. No discrete abscess is present. 3. While there is no definite enhancement in the disc space, there is some left-sided endplate marrow edema and enhancement at L4-5. This most likely represents degenerative change in the setting of left-sided disease. 4. Mild foraminal narrowing bilaterally at L1-2. 5. Moderate to severe right and moderate left foraminal stenosis at L2-3. 6. Moderate left and mild right foraminal stenosis at L3-4. 7. Moderate to severe left subarticular and foraminal stenosis at L4-5. 8. Mild to moderate right subarticular and foraminal narrowing at L4-5. 9. Shallow central disc protrusion at L5-S1 without significant stenosis.  IR consulted for aspiration and drainage.  Abx currently being held for procedure.  Case reviewed by Dr. Denna Haggard who approves patient for the procedure.   Past Medical History:  Diagnosis Date   Arthritis    Benign essential tremor    BPH (benign prostatic hyperplasia)    Concussion 1968   no residual from   COPD (chronic obstructive pulmonary  disease) (HCC)    no inhalers   COVID 03/04/2020   runny nose  at times  loss of taste and smell still present   GERD (gastroesophageal reflux disease)    History of hiatal hernia    small per dr Osborne Casco   History of testicular cancer 2005   right testicle removed and 15 radiation tx   Hx of gynecomastia    due to hcg shots for male infertility   Insomnia    Nocturia    Tobacco abuse    Varicocele    Wears glasses    for reading    Past Surgical History:  Procedure Laterality Date   APPENDECTOMY  1965   open   MASTECTOMY     1 sx for each breast enalrgement sx was 10 yrs apaprt   ORCHIECTOMY  2005   right   surgery for undescended testicle  age 10   right   THULIUM LASER TURP (TRANSURETHRAL RESECTION OF PROSTATE) N/A 06/10/2020   Procedure: THULIUM LASER TURP (TRANSURETHRAL RESECTION OF PROSTATE);  Surgeon: Festus Aloe, MD;  Location: [facility];  Service: Urology;  Laterality: N/A;   varicocoele  1983 or 1984    Allergies: Patient has no known allergies.  Medications: Prior to Admission medications   Medication Sig Start Date End Date Taking? Authorizing Provider  Cholecalciferol (VITAMIN D-3) 125 MCG (5000 UT) TABS Take 1 tablet by mouth daily.   Yes [provider]  clonazePAM (KLONOPIN) 0.5 MG tablet Take 0.25 mg by mouth at bedtime.   Yes [provider]  Coenzyme Q10 (CO Q 10 PO) Take 1 tablet by mouth daily.   Yes [provider]  meclizine (ANTIVERT) 25 MG tablet Take 25 mg by mouth 4 (four) times daily as needed for dizziness. 08/09/21  Yes [provider]  meloxicam (MOBIC) 7.5 MG tablet Take 7.5 mg by mouth daily. 03/05/17  Yes [provider]  omeprazole (PRILOSEC OTC) 20 MG tablet Take 20 mg by mouth daily.   Yes [provider]  predniSONE (STERAPRED UNI-PAK 21 TAB) 10 MG (21) TBPK tablet Take 10-60 mg by mouth as directed. Take 6 tablets on Day 1 Take 5 tablets on Day 2 Take 4 tablets  on Day 3 Take 3 tablets on Day 4 Take 2 tablets on Day 5 Take 1 tablet on Day 6 11/10/21  Yes [provider]  primidone (MYSOLINE) 50 MG tablet TAKE 3 TABLETS BY MOUTH EVERY MORNING, 2 TABLET IN THE AFTERNOON AND 3 IN THE EVENING Patient taking differently: Take 100-150 mg by mouth as directed. TAKE 3 TABLETS BY MOUTH EVERY MORNING, 2 TABLET IN THE AFTERNOON AND 3 IN THE EVENING 08/25/21  Yes Tat, Wells Guiles S, DO  rosuvastatin (CRESTOR) 20 MG tablet TAKE 1 TABLET BY MOUTH EVERY DAY 12/28/20  Yes Adrian Prows, MD  sertraline (ZOLOFT) 50 MG tablet Take 50 mg by mouth daily. 03/04/17  Yes [provider]     Family History  Problem Relation Age of Onset   Prostate cancer Father    Stroke Mother    Prostate cancer Brother    Hypertension Sister    High Cholesterol Sister    Hypertension Sister    Memory loss Sister     Social History   Socioeconomic History   Marital status: Married    Spouse name: Not on file   Number of children: 2   Years of education: Not on file   Highest education level: Not on file  Occupational History   Occupation: Retired    Fish farm manager: [employer]  Tobacco Use   Smoking status: Every Day    Packs/day: 1.50    Years: 40.00    Total pack years: 60.00    Types: Cigarettes   Smokeless tobacco: Current    Types: Chew   Tobacco comments:    Started smoking at age 59.  Currently smoking 1 1/2 ppd. Uses chewing tobacco once weekly.     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  All other systems are negative.  Review of Systems  Constitutional:  Negative for fatigue and fever.  Respiratory:  Negative for cough and choking.   Cardiovascular:  Negative for chest pain.  Gastrointestinal:  Negative for abdominal pain, nausea and vomiting.  Musculoskeletal:  Positive for back pain.  Psychiatric/Behavioral:  Negative for behavioral problems and confusion.     Vital Signs: BP 118/61 (BP Location: Left Arm)   Pulse (!) 52   Temp 97.7 F (36.5 C) (Oral)   Resp 18  Ht '5\' 9"'$  (1.753 m)   Wt 167 lb (75.8 kg)   SpO2 98%   BMI 24.66 kg/m   Physical Exam Vitals and nursing note reviewed.  Constitutional:      General: He is not in acute distress.    Appearance: Normal appearance. He is not ill-appearing.  HENT:     Mouth/Throat:     Mouth: Mucous membranes are moist.     Pharynx: Oropharynx is clear.  Cardiovascular:     Rate and Rhythm: Normal rate and regular rhythm.  Pulmonary:     Effort: Pulmonary effort is normal.     Breath sounds: Normal breath sounds.  Musculoskeletal:        General: Tenderness present.  Skin:    General: Skin is warm and dry.  Neurological:     General: No focal deficit present.     Mental Status: He is alert and oriented to person, place, and time. Mental status is at baseline.  Psychiatric:        Mood and Affect: Mood normal.        Behavior: Behavior normal.        Thought Content: Thought content normal.        Judgment: Judgment normal.      MD Evaluation Airway: WNL Heart: WNL Abdomen: WNL Chest/ Lungs: WNL ASA  Classification: 2 Mallampati/Airway Score: Two   Imaging: MR Lumbar Spine W Wo Contrast  Result Date: 11/11/2021 CLINICAL DATA:  Lumbar radiculopathy. Infection suspected. Progressive symptoms over the last week. EXAM: MRI LUMBAR SPINE WITHOUT AND WITH CONTRAST TECHNIQUE: Multiplanar and multiecho pulse sequences of the lumbar spine  were obtained without and with intravenous contrast. CONTRAST:  7.25m GADAVIST GADOBUTROL 1 MMOL/ML IV SOLN COMPARISON:  None Available. FINDINGS: Segmentation: 5 non rib-bearing lumbar type vertebral bodies are present. The lowest fully formed vertebral body is L5. Alignment: Slight retrolisthesis at L2-3 is worse on the left. No other significant listhesis present. Straightening of the normal lumbar lordosis is present. Levoconvex curvature is present at L2-3. Vertebrae: Sclerotic changes are present anteriorly on the right at L2-3. Fluid is present in the L2-3 disc space. There is some enhancement within the disc space and mild endplate enhancement at L2-3. Subtle enhancement of right paravertebral soft tissues is present as well. Edematous changes are present on the left at L4-5 with some enhancement. No enhancement is present in the disc space. Conus medullaris and cauda equina: Conus extends to the T12-L1 level. Conus and cauda equina appear normal. Paraspinal and other soft tissues: Limited imaging the abdomen is unremarkable. There is no significant adenopathy. No solid organ lesions are present. Disc levels: T12-L1: Negative. L1-2: A broad-based disc protrusion is present. Mild facet hypertrophy is noted. Mild foraminal stenosis is present bilaterally. L2-3: Rightward disc protrusion results in mild right subarticular stenosis. Moderate to severe right foraminal stenosis is present. Moderate left foraminal stenosis is present. L3-4: A broad-based disc protrusion is present. Moderate facet hypertrophy is present bilaterally. Central canal is patent. Moderate left and mild right foraminal narrowing is present. L4-5: A broad-based disc protrusion is present. Chronic loss of height is present on the left. Moderate to severe left subarticular and foraminal narrowing is present. Mild to moderate right subarticular and foraminal narrowing is present. L5-S1: A shallow central disc protrusion is present. No  significant stenosis is present. IMPRESSION: 1. Fluid in the L2-3 disc space with some enhancement within the disc space and mild endplate enhancement. This is concerning for early disc osteomyelitis.  2. Mild right paravertebral soft tissue enhancement is present as well consistent with inflammatory change. No discrete abscess is present. 3. While there is no definite enhancement in the disc space, there is some left-sided endplate marrow edema and enhancement at L4-5. This most likely represents degenerative change in the setting of left-sided disease. 4. Mild foraminal narrowing bilaterally at L1-2. 5. Moderate to severe right and moderate left foraminal stenosis at L2-3. 6. Moderate left and mild right foraminal stenosis at L3-4. 7. Moderate to severe left subarticular and foraminal stenosis at L4-5. 8. Mild to moderate right subarticular and foraminal narrowing at L4-5. 9. Shallow central disc protrusion at L5-S1 without significant stenosis. These results were called by telephone at the time of interpretation on 11/11/2021 at 8:34 pm to provider Jolayne Panther, PA, who verbally acknowledged these results. Electronically Signed   By: San Morelle M.D.   On: 11/11/2021 20:34    Labs:  CBC: Recent Labs    11/11/21 1540 11/12/21 0544  WBC 10.3 10.3  HGB 14.2 13.2  HCT 41.7 39.8  PLT 213 195    COAGS: No results for input(s): "INR", "APTT" in the last 8760 hours.  BMP: Recent Labs    11/11/21 1540 11/12/21 0544  NA 141 141  K 4.1 4.0  CL 108 111  CO2 26 26  GLUCOSE 117* 96  BUN 10 13  CALCIUM 8.9 8.7*  CREATININE 0.99 0.98  GFRNONAA >60 >60    LIVER FUNCTION TESTS: No results for input(s): "BILITOT", "AST", "ALT", "ALKPHOS", "PROT", "ALBUMIN" in the last 8760 hours.  TUMOR MARKERS: No results for input(s): "AFPTM", "CEA", "CA199", "CHROMGRNA" in the last 8760 hours.  Assessment and Plan: L2-L3 disc osteomyelitis Patient admitted with intractable back pain, not improved  with steroids and muscle relaxers as an outpatient.  MR Lumbar Spine shows concern for early, developing L2-L3 disc osteomyelitis. IR consulted for disc aspiration.  Patient assessed at bedside.  He is only able to tolerate lying flat.  Even attempts to raise [facility] or position to sign consent result in excruciating pain. He is agreeable to procedure in IR.   Risks and benefits of aspiration was discussed with the patient and/or patient's family including, but not limited to bleeding, infection, damage to adjacent structures or low yield requiring additional tests.  All of the questions were answered and there is agreement to proceed.  Consent signed and in chart.  Thank you for this interesting consult.  I greatly enjoyed meeting YAO HYPPOLITE and look forward to participating in their care.  A copy of this report was sent to the requesting provider on this date.  Electronically Signed: Docia Barrier, PA 11/12/2021, 1:24 PM   I spent a total of 40 Minutes    in face to face in clinical consultation, greater than 50% of which was counseling/coordinating care for L2-L3 disc osteomyelitis.

## 2021-11-13 ENCOUNTER — Inpatient Hospital Stay (HOSPITAL_COMMUNITY): Payer: Medicare Other

## 2021-11-13 DIAGNOSIS — M4626 Osteomyelitis of vertebra, lumbar region: Secondary | ICD-10-CM | POA: Diagnosis not present

## 2021-11-13 HISTORY — PX: IR LUMBAR DISC ASPIRATION W/IMG GUIDE: IMG5306

## 2021-11-13 LAB — SEDIMENTATION RATE: Sed Rate: 1 mm/hr (ref 0–16)

## 2021-11-13 LAB — PROTIME-INR
INR: 1 (ref 0.8–1.2)
Prothrombin Time: 13 seconds (ref 11.4–15.2)

## 2021-11-13 LAB — URINE CULTURE

## 2021-11-13 LAB — C-REACTIVE PROTEIN: CRP: <0.5 mg/dL (ref <1.0)

## 2021-11-13 LAB — HIV ANTIBODY (ROUTINE TESTING W REFLEX): HIV Screen 4th Generation wRfx: NONREACTIVE

## 2021-11-13 LAB — MRSA NEXT GEN BY PCR, NASAL: MRSA by PCR Next Gen: NOT DETECTED

## 2021-11-13 MED ORDER — LIDOCAINE HCL (PF) 1 % IJ SOLN
INTRAMUSCULAR | Status: AC | PRN
  Administered 2021-11-13: 10 mL via INTRADERMAL

## 2021-11-13 MED ORDER — MIDAZOLAM HCL 2 MG/2ML IJ SOLN
INTRAMUSCULAR | Status: AC
  Filled 2021-11-13: qty 4

## 2021-11-13 MED ORDER — OXYCODONE HCL 5 MG PO TABS
10.0000 mg | ORAL_TABLET | ORAL | Status: DC | PRN
  Administered 2021-11-13 – 2021-11-15 (×3): 10 mg via ORAL
  Filled 2021-11-13 (×3): qty 2

## 2021-11-13 MED ORDER — POLYETHYLENE GLYCOL 3350 17 G PO PACK
17.0000 g | PACK | Freq: Every day | ORAL | Status: DC
  Administered 2021-11-15 – 2021-11-16 (×2): 17 g via ORAL
  Filled 2021-11-13 (×4): qty 1

## 2021-11-13 MED ORDER — LIDOCAINE HCL (PF) 1 % IJ SOLN
INTRAMUSCULAR | Status: AC
  Filled 2021-11-13: qty 30

## 2021-11-13 MED ORDER — SODIUM CHLORIDE 0.9 % IV SOLN
2.0000 g | INTRAVENOUS | Status: DC
  Administered 2021-11-13 – 2021-11-18 (×6): 2 g via INTRAVENOUS
  Filled 2021-11-13 (×6): qty 20

## 2021-11-13 MED ORDER — FENTANYL CITRATE (PF) 100 MCG/2ML IJ SOLN
INTRAMUSCULAR | Status: AC | PRN
  Administered 2021-11-13: 100 ug via INTRAVENOUS

## 2021-11-13 MED ORDER — MIDAZOLAM HCL 2 MG/2ML IJ SOLN
INTRAMUSCULAR | Status: AC | PRN
  Administered 2021-11-13: 1 mg via INTRAVENOUS

## 2021-11-13 MED ORDER — MORPHINE SULFATE (PF) 2 MG/ML IV SOLN
2.0000 mg | INTRAVENOUS | Status: DC | PRN
  Administered 2021-11-13 – 2021-11-14 (×3): 2 mg via INTRAVENOUS
  Filled 2021-11-13 (×3): qty 1

## 2021-11-13 MED ORDER — FENTANYL CITRATE (PF) 100 MCG/2ML IJ SOLN
INTRAMUSCULAR | Status: AC
  Filled 2021-11-13: qty 4

## 2021-11-13 MED ORDER — SODIUM CHLORIDE 0.9 % IV SOLN
8.0000 mg/kg | Freq: Every day | INTRAVENOUS | Status: DC
  Administered 2021-11-13 – 2021-11-17 (×5): 600 mg via INTRAVENOUS
  Filled 2021-11-13 (×6): qty 12

## 2021-11-13 NOTE — Procedures (Signed)
Interventional Radiology Procedure Note  Procedure: L2-L3 disc aspiration  Complications: None  Estimated Blood Loss: None  Recommendations: - Cultures sent   Signed,  Criselda Peaches, MD

## 2021-11-13 NOTE — Consult Note (Addendum)
[facility]                                                                                        Patient Identification: Patient Name: Cameron [name] MRN: [id number] [place] Date: 11/11/2021  1:48 PM Today's Date: 11/13/2021 Reason for consult: Lumbar discitis and osteomyelitis  Requesting provider: Shelly Coss  Principal Problem:   Acute osteomyelitis of lumbar spine [facility]) Active Problems:   Discitis   Antibiotics: None   Lines/Hardware:  Assessment # Lumbar L2-L3 discitis and osteomyelitis in the setting of degenerative disc disease and radiculopathy S/p IR guid lumbar disc L2-L3 disc aspiration 9/11 ESR and CRP within normal limit 9/10 HIV NR  Recommendations  Start daptomycin 8 MG per KG and ceftriaxone 2 g IV daily Follow-up blood cultures, disc cultures ( aerobic/anaerobic and fungal)  I have also added pathology to the specimen Will need prolonged IV antibiotics for 6-8 weeks PICC to be placed once blood cultures are negative in 72 hrs  Monitor for any new neurological signs/symptoms  Following cultures   Rest of the management as per the primary team. Please call with questions or concerns.  Thank you for the consult  Rosiland Oz, MD Infectious Disease Physician [facility] [address] 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  He was splitting and stacking wood on 9/2 and was experiencing low back pain the  following day which he attributed to the work he had been doing.  However the pain progressively got worse and was seen at an orthopedic urgent care Friday and was given diagnosis of degenerative disc disease and scoliosis.  He was treated with prednisone/muscle relaxer.  Unfortunately the pain continued to get worse and he was unable to walk and came to the ED on 9/9. he received 200 of fentanyl in route with no relief.  Case was discussed from ED with neurosurgery who did not feel they needed to be involved at this point.  Spoke with his wife as well as patient.  Back pain radiates to his bilateral thighs. he had on and off numbness in his right knee for last 3 days.  Denies prior history of steroid injection, back surgery, any hardware or IVDU. He was seen immediately after his lumbar disc aspiration and was having severe pain in his back which somewhat eased off after getting IV dilaudid.   Smokes cigarettes, denies alcohol use  Denies prior fevers, chills and sweats.  Nausea yesterday but no vomiting or diarrhea or abdominal pain.  Denies GU symptoms or rashes or joint pain.  Denies weakness in the upper extremities and lower extremities.   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   Insomnia    Nocturia    Tobacco abuse    Varicocele    Wears glasses    for reading   Past Surgical History:  Procedure  Laterality Date   APPENDECTOMY  1965   open   MASTECTOMY     1 sx for each breast enalrgement sx was 10 yrs apaprt   ORCHIECTOMY  2005   right   surgery for undescended testicle  age 8   right   THULIUM LASER TURP (TRANSURETHRAL RESECTION OF PROSTATE) N/A 06/10/2020   Procedure: THULIUM LASER TURP (TRANSURETHRAL RESECTION OF PROSTATE);  Surgeon: Festus Aloe, MD;  [facility];  Service: Urology;  Laterality: N/A;   varicocoele  1983 or 1984     Scheduled Meds:  clonazepam  0.25 mg Oral QHS   diclofenac  1 patch Transdermal BID   pantoprazole  40 mg Oral Daily   polyethylene glycol  17 g Oral Daily   primidone  100 mg Oral q1600   primidone  150 mg Oral BID   rosuvastatin  20 mg Oral Daily   sertraline  50 mg Oral Daily   Continuous Infusions:  methocarbamol (ROBAXIN) IV     PRN Meds:.acetaminophen, HYDROmorphone (DILAUDID) injection, methocarbamol (ROBAXIN) IV, morphine injection, ondansetron **OR** ondansetron (ZOFRAN) IV, oxyCODONE  No Known Allergies  Social History   Socioeconomic History   Marital status: Married    Spouse name: Not on file   Number of children: 2   Years of education: Not on file   Highest education level: Not on file  Occupational History   Occupation: Retired    Fish farm manager: [employer]  Tobacco Use   Smoking status: Every Day    Packs/day: 1.50    Years: 40.00    Total pack years: 60.00    Types: Cigarettes   Smokeless tobacco: Current    Types: Chew   Tobacco comments:    Started smoking at age 89.  Currently smoking 1 1/2 ppd. Uses chewing tobacco once weekly.     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   Vaping Use   Vaping Use: Former  Substance and Sexual Activity   Alcohol use: Not Currently   Drug use: No   Sexual activity: Not on file  Other Topics Concern   Not on file  Social History Narrative   Right handed    Social Determinants of Health   Financial Resource Strain: Not on file  Food Insecurity: Not on file   Transportation Needs: Not on file  Physical Activity: Not on file  Stress: Not on file  Social Connections: Not on file  Intimate Partner Violence: Not on file   Family History  Problem Relation Age of Onset   Prostate cancer Father    Stroke Mother    Prostate cancer Brother    Hypertension Sister    High Cholesterol Sister    Hypertension Sister  Memory loss Sister     Vitals BP (!) 150/71   Pulse 86   Temp 97.8 F (36.6 C) (Oral)   Resp 18   Ht 5' 9" (1.753 m)   Wt 75.8 kg   SpO2 98%   BMI 24.66 kg/m   Physical Exam Constitutional:  lying in the bed and restless due to back pain ( seen just after IR aspiration)    Comments:   Cardiovascular:     Rate and Rhythm: Normal rate and regular rhythm.     Heart sounds:  Pulmonary:     Effort: Pulmonary effort is normal on room air     Comments: Normal breath sounds   Abdominal:     Palpations: Abdomen is soft.     Tenderness: non distended and non tender  Musculoskeletal:        General: No swelling or tenderness. Lower lumbar spinal tenderness+  Skin:    Comments: No obvious rashes   Neurological:     General: awake, alert and oriented, Power 5/5 in all extremities, Sensation b/l intact, CN 2-12 intact   Psychiatric:        Mood and Affect: Mood normal.    Pertinent Microbiology Results for orders placed or performed during the hospital encounter of 11/11/21  Culture, blood (Routine X 2) w Reflex to ID Panel     Status: None (Preliminary result)   Collection Time: 11/11/21 10:54 PM   Specimen: BLOOD LEFT WRIST  Result Value Ref Range Status   Specimen Description   Final    BLOOD LEFT WRIST Performed at [facility] [address] [place], [place] [address]    Special Requests   Final    BOTTLES DRAWN AEROBIC AND ANAEROBIC Blood Culture results may not be optimal due to an excessive volume of blood received in culture bottles Performed at [facility] [address] [place], [place] [address]    Culture   Final    NO GROWTH 1 DAY Performed at [facility], [place] [address] [place], [place] [address]    Report Status PENDING  Incomplete  Urine Culture     Status: Abnormal   Collection Time: 11/11/21 11:14 PM   Specimen: Urine, Clean Catch  Result Value Ref Range Status   Specimen Description   Final    URINE, CLEAN CATCH Performed at [facility], [place] [address], [place], [place] [address]    Special Requests   Final    NONE Performed at [facility], [place] [address] [place], [place] [address]    Culture MULTIPLE SPECIES PRESENT, SUGGEST RECOLLECTION (A)  Final   Report Status 11/13/2021 FINAL  Final   Pertinent Lab seen by me:    Latest Ref Rng & Units 11/12/2021    5:44 AM 11/11/2021    3:40 PM 03/22/2017    4:32 PM  CBC  WBC 4.0 - 10.5 K/uL 10.3  10.3  6.2   Hemoglobin 13.0 - 17.0 g/dL 13.2  14.2  14.3   Hematocrit 39.0 - 52.0 % 39.8  41.7  42.1   Platelets 150 - 400 K/uL 195  213  202       Latest Ref Rng & Units 11/12/2021    5:44 AM 11/11/2021    3:40 PM 03/22/2017    4:32 PM  CMP  Glucose 70 - 99 mg/dL 96  117  102   BUN 8 - 23 mg/dL 13  10  8   Creatinine 0.61 - 1.24 mg/dL 0.98  0.99  0.99   Sodium 135 - 145 mmol/L 141  141  139   Potassium 3.5 - 5.1 mmol/L 4.0  4.1  4.1   Chloride 98 - 111 mmol/L 111  108  106   CO2 22 - 32 mmol/L _0 Calcium 8.9 - 10.3 mg/dL 8.7  8.9  8.9      Pertinent Imagings/Other Imagings Plain films and CT images have been personally visualized and interpreted; radiology reports have been reviewed. Decision making incorporated into the Impression / Recommendations.  MR Lumbar Spine W Wo Contrast  Result Date: 11/11/2021 CLINICAL DATA:  Lumbar radiculopathy. Infection suspected. Progressive symptoms over the last week. EXAM: MRI LUMBAR SPINE WITHOUT AND WITH CONTRAST TECHNIQUE: Multiplanar and multiecho pulse sequences of the lumbar spine  were obtained without and with intravenous contrast. CONTRAST:  7.52m GADAVIST GADOBUTROL 1 MMOL/ML IV SOLN COMPARISON:  None Available. FINDINGS: Segmentation: 5 non rib-bearing lumbar type vertebral bodies are present. The lowest fully formed vertebral body is L5. Alignment: Slight retrolisthesis at L2-3 is worse on the left. No other significant listhesis present. Straightening of the normal lumbar lordosis is present. Levoconvex curvature is present at L2-3. Vertebrae: Sclerotic changes are present anteriorly on the right at L2-3. Fluid is present in the L2-3 disc space. There is some enhancement within the disc space and mild endplate enhancement at L2-3. Subtle enhancement of right paravertebral soft tissues is present as well. Edematous changes are present on the left at L4-5 with some enhancement. No enhancement is present in the disc space. Conus medullaris and cauda equina: Conus extends to the T12-L1 level. Conus and cauda equina appear normal. Paraspinal and other soft tissues: Limited imaging the abdomen is unremarkable. There is no significant adenopathy. No solid organ lesions are present. Disc levels: T12-L1: Negative. L1-2: A broad-based disc protrusion is present. Mild facet hypertrophy is noted. Mild foraminal stenosis is present bilaterally. L2-3: Rightward disc protrusion results in mild right subarticular stenosis. Moderate to severe right foraminal stenosis is present. Moderate left foraminal stenosis is present. L3-4: A broad-based disc protrusion is present. Moderate facet hypertrophy is present bilaterally. Central canal is patent. Moderate left and mild right foraminal narrowing is present. L4-5: A broad-based disc protrusion is present. Chronic loss of height is present on the left. Moderate to severe left subarticular and foraminal narrowing is present. Mild to moderate right subarticular and foraminal narrowing is present. L5-S1: A shallow central disc protrusion is present. No  significant stenosis is present. IMPRESSION: 1. Fluid in the L2-3 disc space with some enhancement within the disc space and mild endplate enhancement. This is concerning for early disc osteomyelitis. 2. Mild right paravertebral soft tissue enhancement is present as well consistent with inflammatory change. No discrete abscess is present. 3. While there is no definite enhancement in the disc space, there is some left-sided endplate marrow edema and enhancement at L4-5. This most likely represents degenerative change in the setting of left-sided disease. 4. Mild foraminal narrowing bilaterally at L1-2. 5. Moderate to severe right and moderate left foraminal stenosis at L2-3. 6. Moderate left and mild right foraminal stenosis at L3-4. 7. Moderate to severe left subarticular and foraminal stenosis at L4-5. 8. Mild to moderate right subarticular and foraminal narrowing at L4-5. 9. Shallow central disc protrusion at L5-S1 without significant stenosis. These results were called by telephone at the time of interpretation on 11/11/2021 at 8:34 pm to provider  Jolayne Panther, PA, who verbally acknowledged these results. Electronically Signed   By: San Morelle M.D.   On: 11/11/2021 20:34     I spent 80 minutes for this patient encounter including review of prior medical records/discussing diagnostics and treatment plan with the patient/family/coordinate care with primary/other specialits with greater than 50% of time in face to face encounter.   Electronically signed by:   Rosiland Oz, MD Infectious Disease Physician [facility] Pager: [phone]

## 2021-11-13 NOTE — Progress Notes (Signed)
PROGRESS NOTE  Cameron [name]  KGM:[id number] DOB: 1950/08/31 DOA: 11/11/2021 PCP: Haywood Pao, MD   Brief Narrative:  Patient is a 71 year old male with history of testicular cancer s/p resection, radiation, BPH, anxiety, tremor who presented here with complaint of severe back pain which started after spliting/stacking of woods on 9/2.  Patient's pain progressively worsened and he was seen by orthopedics and was prescribed prednisone, muscle relaxants but his pain did not improve so he presented to the emergency department.  Patient described the pain as severe, radiating from low back to the right leg, worse with any movement.  He was found to be afebrile and hemodynamically stable on presentation.  MRI of the lumbar spine was concerning for discitis/osteomyelitis of L2-L3 without any discrete abscess.  Blood culture sent.  IR consulted for disc aspiration, plan for today.  Not started on antibiotics yet.ID also consulted today.  Assessment & Plan:  Principal Problem:   Acute osteomyelitis of lumbar spine (HCC) Active Problems:   Discitis  Intractable back pain/discitis/osteomyelitis of L2-L3:Patient described the pain as severe, radiating from low back to the right leg, worse with any movement.  He was found to be afebrile and hemodynamically stable on presentation.  MRI of the lumbar spine was concerning for discitis/osteomyelitis of L2-L3 without any discrete abscess.  Blood cultures sent,NGTD.  IR consulted for disc aspiration, plan for today.  Not started on antibiotics yet. Continue pain management, supportive care. We will consult ID today.  PT/OT evaluation will be done after aspiration  Anxiety: On Zoloft, Klonopin  History of tremor: On primidone           DVT prophylaxis:SCDs Start: 11/11/21 2140     Code Status: Full Code  Family Communication: Wife at bedside  Patient status:Inpatient  Patient is from :Home  Anticipated discharge UY:QIHK  Estimated  DC date:after complete work up   Consultants: IR  Procedures:None  Antimicrobials:  Anti-infectives (From admission, onward)    None       Subjective:  Patient seen and examined at the bedside today.  Hemodynamically stable.  Still having back pain but he states it has been easing down.  Has back pain when he moves.  Complains of dry mouth and hunger   Objective: Vitals:   11/12/21 1204 11/12/21 1415 11/12/21 1942 11/13/21 0403  BP: 118/61 115/62 128/66 114/60  Pulse: (!) 52 66 (!) 54 (!) 59  Resp: '18 16 16 18  '$ Temp: 97.7 F (36.5 C) 97.6 F (36.4 C) 97.8 F (36.6 C) 97.8 F (36.6 C)  TempSrc: Oral Oral Oral Oral  SpO2: 98% 95% 97% (!) 89%  Weight:      Height:       No intake or output data in the 24 hours ending 11/13/21 1055  Filed Weights   11/11/21 1402  Weight: 75.8 kg    Examination:  General exam: Overall comfortable, not in distress HEENT: PERRL Respiratory system:  no wheezes or crackles  Cardiovascular system: S1 & S2 heard, RRR.  Gastrointestinal system: Abdomen is nondistended, soft and nontender. Central nervous system: Alert and oriented Extremities: No edema, no clubbing ,no cyanosis Skin: No rashes, no ulcers,no icterus     Data Reviewed: I have personally reviewed following labs and imaging studies  CBC: Recent Labs  Lab 11/11/21 1540 11/12/21 0544  WBC 10.3 10.3  NEUTROABS 8.8*  --   HGB 14.2 13.2  HCT 41.7 39.8  MCV 94.8 97.5  PLT 213 742   Basic Metabolic  Panel: Recent Labs  Lab 11/11/21 1540 11/12/21 0544  NA 141 141  K 4.1 4.0  CL 108 111  CO2 26 26  GLUCOSE 117* 96  BUN 10 13  CREATININE 0.99 0.98  CALCIUM 8.9 8.7*     Recent Results (from the past 240 hour(s))  Culture, blood (Routine X 2) w Reflex to ID Panel     Status: None (Preliminary result)   Collection Time: 11/11/21 10:54 PM   Specimen: BLOOD LEFT WRIST  Result Value Ref Range Status   Specimen Description   Final    BLOOD LEFT  WRIST Performed at [facility] [address] [place], [place] [address]    Special Requests   Final    BOTTLES DRAWN AEROBIC AND ANAEROBIC Blood Culture results may not be optimal due to an excessive volume of blood received in culture bottles Performed at [facility] [address] [place], [place] [address]    Culture   Final    NO GROWTH 1 DAY Performed at [facility], [place] [address], [place], [place] [address]    Report Status PENDING  Incomplete  Urine Culture     Status: Abnormal   Collection Time: 11/11/21 11:14 PM   Specimen: Urine, Clean Catch  Result Value Ref Range Status   Specimen Description   Final    URINE, CLEAN CATCH Performed at [facility], [place] [address], [place], [place] [address]    Special Requests   Final    NONE Performed at [facility], [place] [address], [place], [place] [address]    Culture MULTIPLE SPECIES PRESENT, SUGGEST RECOLLECTION (A)  Final   Report Status 11/13/2021 FINAL  Final     Radiology Studies: MR Lumbar Spine W Wo Contrast  Result Date: 11/11/2021 CLINICAL DATA:  Lumbar radiculopathy. Infection suspected. Progressive symptoms over the last week. EXAM: MRI LUMBAR SPINE WITHOUT AND WITH CONTRAST TECHNIQUE: Multiplanar and multiecho pulse sequences of the lumbar spine were obtained without and with intravenous contrast. CONTRAST:  7.8m GADAVIST GADOBUTROL 1 MMOL/ML IV SOLN COMPARISON:  None Available. FINDINGS: Segmentation: 5 non rib-bearing lumbar type vertebral bodies are present. The lowest fully formed vertebral body is L5. Alignment: Slight retrolisthesis at L2-3 is worse on the left. No other significant listhesis present. Straightening of the normal lumbar lordosis is present. Levoconvex curvature is present at L2-3. Vertebrae: Sclerotic changes are present anteriorly on the right at L2-3. Fluid is present in the L2-3 disc space. There is some  enhancement within the disc space and mild endplate enhancement at L2-3. Subtle enhancement of right paravertebral soft tissues is present as well. Edematous changes are present on the left at L4-5 with some enhancement. No enhancement is present in the disc space. Conus medullaris and cauda equina: Conus extends to the T12-L1 level. Conus and cauda equina appear normal. Paraspinal and other soft tissues: Limited imaging the abdomen is unremarkable. There is no significant adenopathy. No solid organ lesions are present. Disc levels: T12-L1: Negative. L1-2: A broad-based disc protrusion is present. Mild facet hypertrophy is noted. Mild foraminal stenosis is present bilaterally. L2-3: Rightward disc protrusion results in mild right subarticular stenosis. Moderate to severe right foraminal stenosis is present. Moderate left foraminal stenosis is present. L3-4: A broad-based disc protrusion is present. Moderate facet hypertrophy is present bilaterally. Central canal is patent. Moderate left and mild right foraminal narrowing is present. L4-5: A broad-based disc protrusion is present. Chronic loss of height  is present on the left. Moderate to severe left subarticular and foraminal narrowing is present. Mild to moderate right subarticular and foraminal narrowing is present. L5-S1: A shallow central disc protrusion is present. No significant stenosis is present. IMPRESSION: 1. Fluid in the L2-3 disc space with some enhancement within the disc space and mild endplate enhancement. This is concerning for early disc osteomyelitis. 2. Mild right paravertebral soft tissue enhancement is present as well consistent with inflammatory change. No discrete abscess is present. 3. While there is no definite enhancement in the disc space, there is some left-sided endplate marrow edema and enhancement at L4-5. This most likely represents degenerative change in the setting of left-sided disease. 4. Mild foraminal narrowing bilaterally at  L1-2. 5. Moderate to severe right and moderate left foraminal stenosis at L2-3. 6. Moderate left and mild right foraminal stenosis at L3-4. 7. Moderate to severe left subarticular and foraminal stenosis at L4-5. 8. Mild to moderate right subarticular and foraminal narrowing at L4-5. 9. Shallow central disc protrusion at L5-S1 without significant stenosis. These results were called by telephone at the time of interpretation on 11/11/2021 at 8:34 pm to provider Jolayne Panther, PA, who verbally acknowledged these results. Electronically Signed   By: San Morelle M.D.   On: 11/11/2021 20:34    Scheduled Meds:  clonazepam  0.25 mg Oral QHS   diclofenac  1 patch Transdermal BID   pantoprazole  40 mg Oral Daily   primidone  100 mg Oral q1600   primidone  150 mg Oral BID   rosuvastatin  20 mg Oral Daily   sertraline  50 mg Oral Daily   Continuous Infusions:  methocarbamol (ROBAXIN) IV       LOS: 1 day   Shelly Coss, MD [facility] P9/01/2022, 10:55 AM

## 2021-11-14 DIAGNOSIS — M4626 Osteomyelitis of vertebra, lumbar region: Secondary | ICD-10-CM | POA: Diagnosis not present

## 2021-11-14 LAB — CK: Total CK: 96 U/L (ref 49–397)

## 2021-11-14 LAB — HIV ANTIBODY (ROUTINE TESTING W REFLEX): HIV Screen 4th Generation wRfx: NONREACTIVE

## 2021-11-14 MED ORDER — LIDOCAINE 5 % EX PTCH
1.0000 | MEDICATED_PATCH | CUTANEOUS | Status: DC
  Administered 2021-11-15: 1 via TRANSDERMAL
  Filled 2021-11-14 (×3): qty 1

## 2021-11-14 MED ORDER — HYDROMORPHONE HCL 1 MG/ML IJ SOLN
1.0000 mg | INTRAMUSCULAR | Status: DC | PRN
  Administered 2021-11-14 – 2021-11-17 (×19): 1 mg via INTRAVENOUS
  Filled 2021-11-14 (×19): qty 1

## 2021-11-14 MED ORDER — GABAPENTIN 300 MG PO CAPS
300.0000 mg | ORAL_CAPSULE | Freq: Three times a day (TID) | ORAL | Status: DC
  Administered 2021-11-14 – 2021-11-18 (×13): 300 mg via ORAL
  Filled 2021-11-14 (×13): qty 1

## 2021-11-14 NOTE — Progress Notes (Signed)
PROGRESS NOTE  Cameron [name]  EXB:[id number] DOB: March 24, 1950 DOA: 11/11/2021 PCP: Haywood Pao, MD   Brief Narrative:  Patient is a 71 year old male with history of testicular cancer s/p resection, radiation, BPH, anxiety, tremor who presented here with complaint of severe back pain which started after spliting/stacking of woods on 9/2.  Patient's pain progressively worsened and he was seen by orthopedics and was prescribed prednisone, muscle relaxants but his pain did not improve so he presented to the emergency department.  Patient described the pain as severe, radiating from low back to the right leg, worse with any movement.  He was found to be afebrile and hemodynamically stable on presentation.  MRI of the lumbar spine was concerning for discitis/osteomyelitis of L2-L3 without any discrete abscess.  Blood culture sent.  IR consulted for disc aspiration.  ID following, on IV antibiotics.  PT/OT consulted   Assessment & Plan:  Principal Problem:   Acute osteomyelitis of lumbar spine (HCC) Active Problems:   Discitis  Intractable back pain/discitis/osteomyelitis of L2-L3:Patient described the pain as severe, radiating from low back to the right leg, worse with any movement.  He was found to be afebrile and hemodynamically stable on presentation.  MRI of the lumbar spine was concerning for discitis/osteomyelitis of L2-L3 without any discrete abscess.  Blood cultures sent,NGTD.  IR consulted for disc aspiration. Aerobic/anaerobic culture has not shown any growth.  Currently on Dapto , ceftriaxone Complains of severe pain today radiating on his left thigh.  Pain medication adjusted.  Gabapentin added. PT/OT consulted   Anxiety: On Zoloft, Klonopin  History of tremor: On primidone           DVT prophylaxis:SCDs Start: 11/11/21 2140     Code Status: Full Code  Family Communication: Wife at bedside  Patient status:Inpatient  Patient is from :Home  Anticipated  discharge NU:UVOZ  Estimated DC date:after complete work up   Consultants: IR  Procedures:None  Antimicrobials:  Anti-infectives (From admission, onward)    Start     Dose/Rate Route Frequency Ordered Stop   11/13/21 1400  DAPTOmycin (CUBICIN) 600 mg in sodium chloride 0.9 % IVPB        8 mg/kg  75.8 kg 124 mL/hr over 30 Minutes Intravenous Daily 11/13/21 1312     11/13/21 1400  cefTRIAXone (ROCEPHIN) 2 g in sodium chloride 0.9 % 100 mL IVPB        2 g 200 mL/hr over 30 Minutes Intravenous Every 24 hours 11/13/21 1312         Subjective:  Patient seen and examined at the bedside today.  Hemodynamically stable.  Lying in bed.  Complains of severe pain radiating to his left thigh today.  We discussed about tuning the pain meds,PT/OT Objective: Vitals:   11/13/21 1157 11/13/21 1339 11/13/21 2024 11/14/21 0458  BP: (!) 150/71 (!) 123/59 (!) 144/64 (!) 146/67  Pulse: 86 66 61 68  Resp: '18 18 20 20  '$ Temp:  97.9 F (36.6 C) 97.7 F (36.5 C) 98 F (36.7 C)  TempSrc:  Oral Oral Oral  SpO2: 98% 94% 94% 93%  Weight:      Height:        Intake/Output Summary (Last 24 hours) at 11/14/2021 1159 Last data filed at 11/13/2021 1814 Gross per 24 hour  Intake 62 ml  Output 200 ml  Net -138 ml    Filed Weights   11/11/21 1402  Weight: 75.8 kg    Examination:  General exam: Overall comfortable, not  in distress HEENT: PERRL Respiratory system:  no wheezes or crackles  Cardiovascular system: S1 & S2 heard, RRR.  Gastrointestinal system: Abdomen is nondistended, soft and nontender. Central nervous system: Alert and oriented Extremities: No edema, no clubbing ,no cyanosis Skin: No rashes, no ulcers,no icterus       Data Reviewed: I have personally reviewed following labs and imaging studies  CBC: Recent Labs  Lab 11/11/21 1540 11/12/21 0544  WBC 10.3 10.3  NEUTROABS 8.8*  --   HGB 14.2 13.2  HCT 41.7 39.8  MCV 94.8 97.5  PLT 213 465   Basic Metabolic  Panel: Recent Labs  Lab 11/11/21 1540 11/12/21 0544  NA 141 141  K 4.1 4.0  CL 108 111  CO2 26 26  GLUCOSE 117* 96  BUN 10 13  CREATININE 0.99 0.98  CALCIUM 8.9 8.7*     Recent Results (from the past 240 hour(s))  Culture, blood (Routine X 2) w Reflex to ID Panel     Status: None (Preliminary result)   Collection Time: 11/11/21 10:54 PM   Specimen: BLOOD LEFT WRIST  Result Value Ref Range Status   Specimen Description   Final    BLOOD LEFT WRIST Performed at [facility] [address], [place], [place] [address]    Special Requests   Final    BOTTLES DRAWN AEROBIC AND ANAEROBIC Blood Culture results may not be optimal due to an excessive volume of blood received in culture bottles Performed at [facility] [address] [place], [place] [address]    Culture   Final    NO GROWTH 2 DAYS Performed at [facility] [address], [place], [place] [address]    Report Status PENDING  Incomplete  Urine Culture     Status: Abnormal   Collection Time: 11/11/21 11:14 PM   Specimen: Urine, Clean Catch  Result Value Ref Range Status   Specimen Description   Final    URINE, CLEAN CATCH Performed at [facility], [place] [address], [place], [place] [address]    Special Requests   Final    NONE Performed at [facility], [place] [address] [place], [place] [address]    Culture MULTIPLE SPECIES PRESENT, SUGGEST RECOLLECTION (A)  Final   Report Status 11/13/2021 FINAL  Final  Aerobic/Anaerobic Culture w Gram Stain (surgical/deep wound)     Status: None (Preliminary result)   Collection Time: 11/13/21 11:53 AM   Specimen: Abscess  Result Value Ref Range Status   Specimen Description   Final    ABSCESS Performed at [facility] [address], [place], [place] [address]    Special Requests INTERVERTEBRAL DISC  Final   Gram Stain   Final    RARE WBC PRESENT,BOTH PMN AND  MONONUCLEAR NO ORGANISMS SEEN    Culture   Final    NO GROWTH < 24 HOURS Performed at [facility], [place] [address], [place], [place] [address]    Report Status PENDING  Incomplete  MRSA Next Gen by PCR, Nasal     Status: None   Collection Time: 11/13/21  1:07 PM   Specimen: Nasal Mucosa; Nasal Swab  Result Value Ref Range Status   MRSA by PCR Next Gen NOT DETECTED NOT DETECTED Final    Comment: (NOTE) The GeneXpert MRSA Assay (FDA approved for NASAL specimens only), is one component of a comprehensive MRSA colonization surveillance program. It is not intended to  diagnose MRSA infection nor to guide or monitor treatment for MRSA infections. Test performance is not FDA approved in patients less than 56 years old. Performed at [facility], [place] [address], [place], [place] [address]      Radiology Studies: IR LUMBAR [place] ASPIRATION W/IMG GUIDE  Result Date: 11/13/2021 INDICATION: 71 year old male with L2-L3 osteomyelitis discitis EXAM: Disc aspiration MEDICATIONS: None. ANESTHESIA/SEDATION: Fentanyl 100 mcg IV; Versed 2 mg IV Moderate Sedation Time:  15 minutes The patient's vital signs and level of consciousness were continuously monitored during the procedure by the interventional radiology nurse under my direct supervision. COMPLICATIONS: None immediate. PROCEDURE: Informed written consent was obtained from the patient after a thorough discussion of the procedural risks, benefits and alternatives. All questions were addressed. Maximal Sterile Barrier Technique was utilized including caps, mask, sterile gowns, sterile gloves, sterile drape, hand hygiene and skin antiseptic. A timeout was performed prior to the initiation of the procedure. A suitable skin entry site was selected and marked. The overlying skin was sterilely prepped and draped in the standard fashion using Betadine. A 22 gauge spinal needle was then carefully advanced into the intervertebral disc at  L2-L3 under fluoroscopic guidance. Aspiration was performed yielding 1 cc of serosanguineous fluid. This was sent for Gram stain and culture. IMPRESSION: Successful L2-L3 disc aspiration yielding 1 mL serosanguineous fluid. Electronically Signed   By: Jacqulynn Cadet M.D.   On: 11/13/2021 12:41    Scheduled Meds:  clonazepam  0.25 mg Oral QHS   diclofenac  1 patch Transdermal BID   gabapentin  300 mg Oral TID   lidocaine  1 patch Transdermal Q24H   pantoprazole  40 mg Oral Daily   polyethylene glycol  17 g Oral Daily   primidone  100 mg Oral q1600   primidone  150 mg Oral BID   rosuvastatin  20 mg Oral Daily   sertraline  50 mg Oral Daily   Continuous Infusions:  cefTRIAXone (ROCEPHIN)  IV 2 g (11/13/21 1504)   DAPTOmycin (CUBICIN) 600 mg in sodium chloride 0.9 % IVPB 600 mg (11/13/21 1649)   methocarbamol (ROBAXIN) IV 500 mg (11/13/21 1814)     LOS: 2 days   Shelly Coss, MD [facility] P9/02/2022, 11:59 AM

## 2021-11-15 ENCOUNTER — Inpatient Hospital Stay: Payer: Self-pay

## 2021-11-15 DIAGNOSIS — M4626 Osteomyelitis of vertebra, lumbar region: Secondary | ICD-10-CM | POA: Diagnosis not present

## 2021-11-15 LAB — CBC
HCT: 38.7 % — ABNORMAL LOW (ref 39.0–52.0)
Hemoglobin: 13.2 g/dL (ref 13.0–17.0)
MCH: 33.2 pg (ref 26.0–34.0)
MCHC: 34.1 g/dL (ref 30.0–36.0)
MCV: 97.2 fL (ref 80.0–100.0)
Platelets: 158 10*3/uL (ref 150–400)
RBC: 3.98 MIL/uL — ABNORMAL LOW (ref 4.22–5.81)
RDW: 13.2 % (ref 11.5–15.5)
WBC: 6.7 10*3/uL (ref 4.0–10.5)
nRBC: 0 % (ref 0.0–0.2)

## 2021-11-15 LAB — BASIC METABOLIC PANEL
Anion gap: 6 (ref 5–15)
BUN: 12 mg/dL (ref 8–23)
CO2: 30 mmol/L (ref 22–32)
Calcium: 8.6 mg/dL — ABNORMAL LOW (ref 8.9–10.3)
Chloride: 102 mmol/L (ref 98–111)
Creatinine, Ser: 0.85 mg/dL (ref 0.61–1.24)
GFR, Estimated: >60 mL/min (ref >60)
Glucose, Bld: 105 mg/dL — ABNORMAL HIGH (ref 70–99)
Potassium: 3.8 mmol/L (ref 3.5–5.1)
Sodium: 138 mmol/L (ref 135–145)

## 2021-11-15 MED ORDER — HYDROMORPHONE HCL 2 MG PO TABS
4.0000 mg | ORAL_TABLET | ORAL | Status: DC | PRN
  Administered 2021-11-15 – 2021-11-18 (×13): 4 mg via ORAL
  Filled 2021-11-15 (×14): qty 2

## 2021-11-15 MED ORDER — HYDROMORPHONE HCL 2 MG PO TABS
2.0000 mg | ORAL_TABLET | ORAL | Status: DC | PRN
  Administered 2021-11-15: 2 mg via ORAL
  Filled 2021-11-15: qty 1

## 2021-11-15 MED ORDER — SENNOSIDES-DOCUSATE SODIUM 8.6-50 MG PO TABS
1.0000 | ORAL_TABLET | Freq: Two times a day (BID) | ORAL | Status: DC
  Administered 2021-11-15 – 2021-11-17 (×5): 1 via ORAL
  Filled 2021-11-15 (×6): qty 1

## 2021-11-15 NOTE — Progress Notes (Signed)
Inpatient Rehab Admissions Coordinator:  ? ?Per therapy recommendations,  patient was screened for CIR candidacy by Kail Fraley, MS, CCC-SLP. At this time, Pt. Appears to be a a potential candidate for CIR. I will place   order for rehab consult per protocol for full assessment. Please contact me any with questions. ? ?Felica Chargois, MS, CCC-SLP ?Rehab Admissions Coordinator  ?[phone] (celll) ?[phone] (office) ? ?

## 2021-11-15 NOTE — Progress Notes (Signed)
PROGRESS NOTE  Cameron [name] XIP:[id number] DOB: [birth year] DOA: 11/11/2021 PCP: Haywood Pao, MD   LOS: 3 days   Brief Narrative / Interim history: Patient is a 71 year old male with history of testicular cancer s/p resection, radiation, BPH, anxiety, tremor who presented here with complaint of severe back pain which started after spliting/stacking of woods on 9/2.  Patient's pain progressively worsened and he was seen by orthopedics and was prescribed prednisone, muscle relaxants but his pain did not improve so he presented to the emergency department.  Patient described the pain as severe, radiating from low back to the right leg, worse with any movement.  He was found to be afebrile and hemodynamically stable on presentation.  MRI of the lumbar spine was concerning for discitis/osteomyelitis of L2-L3 without any discrete abscess.  Blood culture sent.  IR consulted for disc aspiration.  ID following, on IV antibiotics.  PT/OT consulted  Significant events: 9/9-admission to the hospital 9/11-L2-L3 disc aspiration  Significant imaging / results / micro data: Disc aspirate 9/11-no growth, no organisms Blood cultures 9/10-no growth  Subjective / 24h Interval events: Complete of severe low back pain, afraid to take even a few steps.  Assesement and Plan: Principal Problem:   Acute osteomyelitis of lumbar spine ([place]) Active Problems:   Discitis   Principal problem Intractable back pain, discitis/osteomyelitis of L2-L3-pain is continues to be severe today.  Feels like oxycodone is helping, discussed extensively with the patient, switch to oral Dilaudid and see if he gets better relief -MRI on admission was concerning for discitis/osteomyelitis L2-L3 without any abscesses.  Underwent aspiration by IR 9/11, cultures pending.  ID following as well  Active problems Anxiety-continue Zoloft, clonazepam  History of tremor-continue treatment with  Scheduled Meds:  clonazepam  0.25  mg Oral QHS   diclofenac  1 patch Transdermal BID   gabapentin  300 mg Oral TID   lidocaine  1 patch Transdermal Q24H   pantoprazole  40 mg Oral Daily   polyethylene glycol  17 g Oral Daily   primidone  100 mg Oral q1600   primidone  150 mg Oral BID   senna-docusate  1 tablet Oral BID   sertraline  50 mg Oral Daily   Continuous Infusions:  cefTRIAXone (ROCEPHIN)  IV 2 g (11/14/21 1503)   DAPTOmycin (CUBICIN) 600 mg in sodium chloride 0.9 % IVPB 600 mg (11/14/21 1932)   methocarbamol (ROBAXIN) IV 500 mg (11/13/21 1814)   PRN Meds:.acetaminophen, HYDROmorphone (DILAUDID) injection, HYDROmorphone, methocarbamol (ROBAXIN) IV, ondansetron **OR** ondansetron (ZOFRAN) IV  Current Outpatient Medications  Medication Instructions   Cholecalciferol (VITAMIN D-3) 125 MCG (5000 UT) TABS 1 tablet, Oral, Daily   clonazePAM (KLONOPIN) 0.25 mg, Oral, Daily at bedtime   Coenzyme Q10 (CO Q 10 PO) 1 tablet, Oral, Daily   meclizine (ANTIVERT) 25 mg, Oral, 4 times daily PRN   meloxicam (MOBIC) 7.5 mg, Oral, Daily   omeprazole (PRILOSEC OTC) 20 mg, Oral, Daily   predniSONE (STERAPRED UNI-PAK 21 TAB) 10-60 mg, Oral, As directed, Take 6 tablets on Day 1<BR>Take 5 tablets on Day 2<BR>Take 4 tablets on Day 3<BR>Take 3 tablets on Day 4<BR>Take 2 tablets on Day 5<BR>Take 1 tablet on Day 6   primidone (MYSOLINE) 50 MG tablet TAKE 3 TABLETS BY MOUTH EVERY MORNING, 2 TABLET IN THE AFTERNOON AND 3 IN THE EVENING   rosuvastatin (CRESTOR) 20 MG tablet TAKE 1 TABLET BY MOUTH EVERY DAY   sertraline (ZOLOFT) 50 mg, Oral, Daily    Diet Orders (  From admission, onward)     Start     Ordered   11/13/21 1247  Diet regular Room service appropriate? Yes; Fluid consistency: Thin  Diet effective now       Question Answer Comment  Room service appropriate? Yes   Fluid consistency: Thin      11/13/21 1246            DVT prophylaxis: SCDs Start: 11/11/21 2140   Lab Results  Component Value Date   PLT 158  11/15/2021      Code Status: Full Code  Family Communication: Wife at bedside  Status is: Inpatient Remains inpatient appropriate because: Severe pain   Level of care: Med-Surg  Consultants:  ID  Objective: Vitals:   11/14/21 0458 11/14/21 1500 11/15/21 0059 11/15/21 0345  BP: (!) 146/67 137/60 (!) 160/58 (!) 134/57  Pulse: 68 69 64 82  Resp: '20 16  18  '$ Temp: 98 F (36.7 C) 97.7 F (36.5 C) 97.9 F (36.6 C) 97.9 F (36.6 C)  TempSrc: Oral Oral Oral Oral  SpO2: 93% 95% 99% 91%  Weight:      Height:        Intake/Output Summary (Last 24 hours) at 11/15/2021 1303 Last data filed at 11/15/2021 1000 Gross per 24 hour  Intake 542 ml  Output 500 ml  Net 42 ml   Wt Readings from Last 3 Encounters:  11/11/21 75.8 kg  08/15/21 76.3 kg  05/02/21 76.3 kg    Examination:  Constitutional: NAD Eyes: no scleral icterus ENMT: Mucous membranes are moist.  Neck: normal, supple Respiratory: clear to auscultation bilaterally, no wheezing, no crackles. Cardiovascular: Regular rate and rhythm, no murmurs / rubs / gallops. No LE edema.  Abdomen: non distended, no tenderness. Bowel sounds positive.  Musculoskeletal: no clubbing / cyanosis.  Skin: no rashes Neurologic: non focal   Data Reviewed: I have independently reviewed following labs and imaging studies   CBC Recent Labs  Lab 11/11/21 1540 11/12/21 0544 11/15/21 0506  WBC 10.3 10.3 6.7  HGB 14.2 13.2 13.2  HCT 41.7 39.8 38.7*  PLT 213 195 158  MCV 94.8 97.5 97.2  MCH 32.3 32.4 33.2  MCHC 34.1 33.2 34.1  RDW 13.5 14.0 13.2  LYMPHSABS 1.0  --   --   MONOABS 0.4  --   --   EOSABS 0.0  --   --   BASOSABS 0.0  --   --     Recent Labs  Lab 11/11/21 1540 11/11/21 2254 11/12/21 0544 11/13/21 0500 11/15/21 0506  NA 141  --  141  --  138  K 4.1  --  4.0  --  3.8  CL 108  --  111  --  102  CO2 26  --  26  --  30  GLUCOSE 117*  --  96  --  105*  BUN 10  --  13  --  12  CREATININE 0.99  --  0.98  --  0.85   CALCIUM 8.9  --  8.7*  --  8.6*  CRP  --  0.5 <0.5 <0.5  --   INR  --   --   --  1.0  --     ------------------------------------------------------------------------------------------------------------------ No results for input(s): "CHOL", "HDL", "LDLCALC", "TRIG", "CHOLHDL", "LDLDIRECT" in the last 72 hours.  No results found for: "HGBA1C" ------------------------------------------------------------------------------------------------------------------ No results for input(s): "TSH", "T4TOTAL", "T3FREE", "THYROIDAB" in the last 72 hours.  Invalid input(s): "FREET3"  Cardiac Enzymes No results for  input(s): "CKMB", "TROPONINI", "MYOGLOBIN" in the last 168 hours.  Invalid input(s): "CK" ------------------------------------------------------------------------------------------------------------------ No results found for: "BNP"  CBG: No results for input(s): "GLUCAP" in the last 168 hours.  Recent Results (from the past 240 hour(s))  Culture, blood (Routine X 2) w Reflex to ID Panel     Status: None (Preliminary result)   Collection Time: 11/11/21 10:54 PM   Specimen: BLOOD LEFT WRIST  Result Value Ref Range Status   Specimen Description   Final    BLOOD LEFT WRIST Performed at [facility] [address] [place], [place] [address]    Special Requests   Final    BOTTLES DRAWN AEROBIC AND ANAEROBIC Blood Culture results may not be optimal due to an excessive volume of blood received in culture bottles Performed at [facility] [address] [place], [place] [address]    Culture   Final    NO GROWTH 3 DAYS Performed at [facility], [place] [address] [place], [place] [address]    Report Status PENDING  Incomplete  Urine Culture     Status: Abnormal   Collection Time: 11/11/21 11:14 PM   Specimen: Urine, Clean Catch  Result Value Ref Range Status   Specimen Description   Final    URINE, CLEAN CATCH Performed at [facility], [place] [address] [place], [place] [address]    Special Requests   Final    NONE Performed at [facility], [place] [address], [place], [place] [address]    Culture MULTIPLE SPECIES PRESENT, SUGGEST RECOLLECTION (A)  Final   Report Status 11/13/2021 FINAL  Final  Culture, blood (Routine X 2) w Reflex to ID Panel     Status: None (Preliminary result)   Collection Time: 11/12/21  5:44 AM   Specimen: BLOOD LEFT HAND  Result Value Ref Range Status   Specimen Description   Final    BLOOD LEFT HAND Performed at [facility] [address], [place], [place] [address]    Special Requests   Final    BOTTLES DRAWN AEROBIC AND ANAEROBIC Blood Culture adequate volume Performed at [facility] [address] [place], [place] [address]    Culture   Final    NO GROWTH 3 DAYS Performed at [facility], [place] [address], [place], [place] [address]    Report Status PENDING  Incomplete  Aerobic/Anaerobic Culture w Gram Stain (surgical/deep wound)     Status: None (Preliminary result)   Collection Time: 11/13/21 11:53 AM   Specimen: Abscess  Result Value Ref Range Status   Specimen Description   Final    ABSCESS Performed at [facility] [address], [place], [place] [address]    Special Requests INTERVERTEBRAL DISC  Final   Gram Stain   Final    RARE WBC PRESENT,BOTH PMN AND MONONUCLEAR NO ORGANISMS SEEN    Culture   Final    NO GROWTH 2 DAYS Performed at [facility], [place] [address] [place], [place] [address]    Report Status PENDING  Incomplete  MRSA Next Gen by PCR, Nasal     Status: None   Collection Time: 11/13/21  1:07 PM   Specimen: Nasal Mucosa; Nasal Swab  Result Value Ref Range Status   MRSA by PCR Next Gen NOT DETECTED NOT DETECTED Final    Comment: (NOTE) The GeneXpert MRSA Assay (FDA approved for NASAL specimens only), is  one component of a comprehensive MRSA  colonization surveillance program. It is not intended to diagnose MRSA infection nor to guide or monitor treatment for MRSA infections. Test performance is not FDA approved in patients less than 15 years old. Performed at [facility], [place] [address] [place], [place] [address]      Radiology Studies: [place] EKG SITE RITE  Result Date: 11/15/2021 If Site Rite image not attached, placement could not be confirmed due to current cardiac rhythm.    Marzetta Board, MD, PhD [facility]  Between 7 am - 7 pm I am available, please contact me via Amion (for emergencies) or Securechat (non urgent messages)  Between 7 pm - 7 am I am not available, please contact night coverage MD/APP via Amion

## 2021-11-15 NOTE — Progress Notes (Signed)
[facility] Infectious Diseases Follow Up Note  Patient Identification: Patient Name: Cameron [name] MRN: [id number] [place] Date: 11/11/2021  1:48 PM Age: 71 y.o.Today's Date: 11/15/2021  Reason for Visit: Lumbar discitis and osteomyelitis   Principal Problem:   Acute osteomyelitis of lumbar spine [facility]) Active Problems:   Discitis  Antibiotics: Daptomycin 9/11-c Ceftriaxone 9/11-c   Lines/Hardware:  Interval Events: afebrile, IR aspirate cx with no growth so far   Assessment # Lumbar L2-L3 discitis and osteomyelitis in the setting of degenerative disc disease and radiculopathy S/p IR guid lumbar disc L2-L3 disc aspiration 9/11. Cx NGTD ESR and CRP within normal limit 9/10 HIV NR 9/9 and 9/10 blood cx with no growth in 3 days   Recommendations Plan for 6 weeks of IV daptomycin and ceftriaxone from 9/11 Monitor CPK, CBC and CMP I will order PICC for long term IV abtx ID pharmacy to place OPAT orders  Pain control per Primary Will fu disc cultures to completion and arrange for ID fu. Otherwise, ID will sign off for now  Rest of the management as per the primary team. Thank you for the consult. Please page with pertinent questions or concerns.  ______________________________________________________________________ Subjective patient seen and examined at the bedside.  Patient has received pain medications a little before I visited and says pain is better.  Wife says he is able to lift his both legs when his pain is well controlled Denies any new neurological symptoms or concerns with the abtx  Vitals BP (!) 134/57 (BP Location: Left Arm)   Pulse 82   Temp 97.9 F (36.6 C) (Oral)   Resp 18   Ht 5' 9"  (1.753 m)   Wt 75.8 kg   SpO2 91%   BMI 24.66 kg/m     Physical Exam Constitutional:  lying in the bed and sitting still to avoid pain     Comments:   Cardiovascular:     Rate and Rhythm: Normal rate and  regular rhythm.     Heart sounds:   Pulmonary:     Effort: Pulmonary effort is normal on room air    Comments:   Abdominal:     Palpations: Abdomen is soft.     Tenderness: non distended and non tender   Musculoskeletal:        General: No swelling or tenderness. Back not examined   Skin:    Comments:   Neurological:     General: awake, alert and oriented, he is able to lift his rt leg and left leg but restricted due to back pain  Psychiatric:        Mood and Affect: Mood normal.   Pertinent Microbiology Results for orders placed or performed during the hospital encounter of 11/11/21  Culture, blood (Routine X 2) w Reflex to ID Panel     Status: None (Preliminary result)   Collection Time: 11/11/21 10:54 PM   Specimen: BLOOD LEFT WRIST  Result Value Ref Range Status   Specimen Description   Final    BLOOD LEFT WRIST Performed at [facility] [address] [place], [place] [address]    Special Requests   Final    BOTTLES DRAWN AEROBIC AND ANAEROBIC Blood Culture results may not be optimal due to an excessive volume of blood received in culture bottles Performed at [facility] [address] [place], [place] [address]    Culture   Final    NO GROWTH 3 DAYS Performed at [facility], [address], [place], [place] [address]    Report Status PENDING  Incomplete  Urine Culture     Status: Abnormal   Collection Time: 11/11/21 11:14 PM   Specimen: Urine, Clean Catch  Result Value Ref Range Status   Specimen Description   Final    URINE, CLEAN CATCH Performed at [facility], [place] [address], [place], [place] [address]    Special Requests   Final    NONE Performed at [facility], [place] [address], [place], [place] [address]    Culture MULTIPLE SPECIES PRESENT, SUGGEST RECOLLECTION (A)  Final   Report Status 11/13/2021 FINAL  Final  Culture, blood (Routine X 2) w Reflex to ID Panel      Status: None (Preliminary result)   Collection Time: 11/12/21  5:44 AM   Specimen: BLOOD LEFT HAND  Result Value Ref Range Status   Specimen Description   Final    BLOOD LEFT HAND Performed at [facility] [address], [place], [place] [address]    Special Requests   Final    BOTTLES DRAWN AEROBIC AND ANAEROBIC Blood Culture adequate volume Performed at [facility] [address] [place], [place] [address]    Culture   Final    NO GROWTH 3 DAYS Performed at [facility], [place] [address], [place], [place] [address]    Report Status PENDING  Incomplete  Aerobic/Anaerobic Culture w Gram Stain (surgical/deep wound)     Status: None (Preliminary result)   Collection Time: 11/13/21 11:53 AM   Specimen: Abscess  Result Value Ref Range Status   Specimen Description   Final    ABSCESS Performed at [facility] [address], [place], [place] [address]    Special Requests INTERVERTEBRAL DISC  Final   Gram Stain   Final    RARE WBC PRESENT,BOTH PMN AND MONONUCLEAR NO ORGANISMS SEEN    Culture   Final    NO GROWTH 2 DAYS Performed at [facility], [place] [address], [place], [place] [address]    Report Status PENDING  Incomplete  MRSA Next Gen by PCR, Nasal     Status: None   Collection Time: 11/13/21  1:07 PM   Specimen: Nasal Mucosa; Nasal Swab  Result Value Ref Range Status   MRSA by PCR Next Gen NOT DETECTED NOT DETECTED Final    Comment: (NOTE) The GeneXpert MRSA Assay (FDA approved for NASAL specimens only), is one component of a comprehensive MRSA colonization surveillance program. It is not intended to diagnose MRSA infection nor to guide or monitor treatment for MRSA infections. Test performance is not FDA approved in patients less than 6 years old. Performed at [facility], [place] [address] [place], [place] [address]     Pertinent Lab.    Latest Ref Rng & Units 11/15/2021     5:06 AM 11/12/2021    5:44 AM 11/11/2021    3:40 PM  CBC  WBC 4.0 - 10.5 K/uL 6.7  10.3  10.3   Hemoglobin 13.0 - 17.0 g/dL 13.2  13.2  14.2   Hematocrit 39.0 - 52.0 % 38.7  39.8  41.7   Platelets 150 - 400 K/uL 158  195  213       Latest Ref Rng & Units 11/15/2021    5:06 AM 11/12/2021    5:44 AM 11/11/2021    3:40 PM  CMP  Glucose 70 -  99 mg/dL 105  96  117   BUN 8 - 23 mg/dL 12  13  10    Creatinine 0.61 - 1.24 mg/dL 0.85  0.98  0.99   Sodium 135 - 145 mmol/L 138  141  141   Potassium 3.5 - 5.1 mmol/L 3.8  4.0  4.1   Chloride 98 - 111 mmol/L 102  111  108   CO2 22 - 32 mmol/L 30  26  26    Calcium 8.9 - 10.3 mg/dL 8.6  8.7  8.9      Pertinent Imaging today Plain films and CT images have been personally visualized and interpreted; radiology reports have been reviewed. Decision making incorporated into the Impression / Recommendations.  [place] EKG SITE RITE  Result Date: 11/15/2021 If Site Rite image not attached, placement could not be confirmed due to current cardiac rhythm.   I spent 46 minutes for this patient encounter including review of prior medical records, coordination of care with primary/other specialist with greater than 50% of time being face to face/counseling and discussing diagnostics/treatment plan with the patient/family.  Electronically signed by:   Rosiland Oz, MD Infectious Disease Physician [facility] for Infectious Disease Pager: [phone]

## 2021-11-15 NOTE — Evaluation (Signed)
Physical Therapy Evaluation Patient Details Name: ISAIH BULGER MRN: [id number] DOB: January 15, 1951 Today's Date: 11/15/2021  History of Present Illness  Pt is 71 yo male admitted 11/11/21 with acute osteomyelitis, discitis L2-L3 with intractable back pain.  Pt with no risk factors.  Paind started after splitting wood, so pt seen by ortho but pain worsened and presented to emergency room 11/11/21.   Pt s/p disc aspiration and on IV antibiotics.  Pt with hx of testicular CA s/p resection, BPH, anxiety, and tremor.  Clinical Impression  Pt admitted with above diagnosis. At baseline pt is active and independent.  He was chopping wood when he thought injury occurred.  Pt currently limited by pain.  He has good strength throughout except R LE currently limited by pain.  Pt with very severe back pain that radiates down R leg - his pain symptoms are 10/10 when spasm occurs which happen frequently.  Pt with little relieve other than with IV medications.  Offered recommendations for positioning, back precautions, and alternative pain control methods. He transferred with min A to EOB and standing but when had spasms needs mod A to balance in standing and was unable to tolerate for more than 10 seconds.  Pt is motivated and should progress well when pain improves.  Do recommend AIR at this time based on decreased function but hope that improves as pain improves. Pt currently with functional limitations due to the deficits listed below (see PT Problem List). Pt will benefit from skilled PT to increase their independence and safety with mobility to allow discharge to the venue listed below.          Recommendations for follow up therapy are one component of a multi-disciplinary discharge planning process, led by the attending physician.  Recommendations may be updated based on patient status, additional functional criteria and insurance authorization.  Follow Up Recommendations Acute inpatient rehab (3hours/day) (may  progress to home but will depend on pain control)      Assistance Recommended at Discharge Frequent or constant Supervision/Assistance  Patient can return home with the following  A lot of help with walking and/or transfers;A lot of help with bathing/dressing/bathroom;Assistance with cooking/housework;Help with stairs or ramp for entrance    Equipment Recommendations Rolling walker (2 wheels) (needs further assessment)  Recommendations for Other Services  Rehab consult    Functional Status Assessment Patient has had a recent decline in their functional status and demonstrates the ability to make significant improvements in function in a reasonable and predictable amount of time.     Precautions / Restrictions Precautions Precautions: Fall;Back Precaution Booklet Issued: Yes (comment) Precaution Comments: Back precautions for pain control (no formal order)      Mobility  Bed Mobility Overal bed mobility: Needs Assistance Bed Mobility: Rolling, Sidelying to Sit, Sit to Sidelying Rolling: Min assist Sidelying to sit: Min assist     Sit to sidelying: Mod assist General bed mobility comments: Educated on log roll technique and performed well to sitting with min A for R leg and trunk.  However, once standing pt so painful that he needed to just lay down - required mod A for legs    Transfers Overall transfer level: Needs assistance Equipment used: Rolling walker (2 wheels) Transfers: Sit to/from Stand Sit to Stand: Mod assist, Min guard           General transfer comment: Initial min guard to rise but when pt had spasm/shooting pain he extends back and buckles at knees requiring mod A.  Transitioned to standing quickly from sitting b/c unable to tolerate sitting R hip    Ambulation/Gait               General Gait Details: unable  Stairs            Wheelchair Mobility    Modified Rankin (Stroke Patients Only)       Balance Overall balance assessment:  Needs assistance Sitting-balance support: Bilateral upper extremity supported Sitting balance-Leahy Scale: Fair Sitting balance - Comments: Requiring bil UE to lift body and keep weight off R side, for pain control not balance; pt was unable to tolerate sitting -tried leaning L but still painful   Standing balance support: Bilateral upper extremity supported Standing balance-Leahy Scale: Poor Standing balance comment: Requiring RW to support weight in arm due to severe pain in R leg; no weight on R Leg and pt still with spasms/stabbing pain causing trunk extension and knee flexion; unable to tolerate standing more than 5-10 seconds                             Pertinent Vitals/Pain Pain Assessment Pain Assessment: 0-10 Pain Score: 7  (7/10 constant; 10/10 with spasms) Pain Location: low back radiates through R hip to knee on the front and medial Pain Descriptors / Indicators: Discomfort, Radiating, Sharp, Shooting, Spasm, Stabbing Pain Intervention(s): Limited activity within patient's tolerance, Monitored during session, Premedicated before session    Home Living Family/patient expects to be discharged to:: Private residence Living Arrangements: Spouse/significant other Available Help at Discharge: Family;Available 24 hours/day Type of Home: House Home Access: Stairs to enter   [employer] of Steps: in front 3 steps with rail on R; in back 5 steps with rails on both sides -can reach both but would have longer to walk   Home Layout: Two level;Able to live on main level with bedroom/bathroom Home Equipment: Grab bars - tub/shower      Prior Function Prior Level of Function : Independent/Modified Independent;Driving             Mobility Comments: Walks in community without difficulty ADLs Comments: Independent with all; was splitting wood prior to admission     Hand Dominance        Extremity/Trunk Assessment   Upper Extremity Assessment Upper  Extremity Assessment: Overall WFL for tasks assessed    Lower Extremity Assessment Lower Extremity Assessment: RLE deficits/detail RLE Deficits / Details: Pain increased in back with movement; ROM WFL; MMT: 2/5 throughout but limited due to pain    Cervical / Trunk Assessment Cervical / Trunk Assessment: Normal  Communication   Communication: No difficulties  Cognition Arousal/Alertness: Awake/alert Behavior During Therapy: WFL for tasks assessed/performed Overall Cognitive Status: Within Functional Limits for tasks assessed                                          General Comments General comments (skin integrity, edema, etc.): Pt reports some relief in pain with IV dilaudid and morphine but it is short lived.  Discussed other options for pain control assistance in addition to meds he is receiving.  Pt reports heat had no effect, ice caused him to spasm worse.  Educated back precautions and on positioning (pillow under knees supine, pillow between knees sidelying).  Also, discussed other alternatives that would be minimal but could help including breathing  techniques, relaxation techniques, distraction.    Exercises     Assessment/Plan    PT Assessment Patient needs continued PT services  PT Problem List Decreased strength;Decreased mobility;Decreased knowledge of precautions;Decreased activity tolerance;Decreased balance;Decreased knowledge of use of DME;Pain       PT Treatment Interventions DME instruction;Therapeutic activities;Modalities;Gait training;Therapeutic exercise;Patient/family education;Balance training;Functional mobility training;Neuromuscular re-education;Manual techniques    PT Goals (Current goals can be found in the Care Plan section)  Acute Rehab PT Goals Patient Stated Goal: return home; decrease pain PT Goal Formulation: With patient/family Time For Goal Achievement: 11/29/21 Potential to Achieve Goals: Good Additional Goals Additional  Goal #1: Will decrease pain to 5/10 to tolerate activity    Frequency Min 3X/week     Co-evaluation               AM-PAC PT "6 Clicks" Mobility  Outcome Measure Help needed turning from your back to your side while in a flat bed without using bedrails?: A Little Help needed moving from lying on your back to sitting on the side of a flat bed without using bedrails?: A Little Help needed moving to and from a bed to a chair (including a wheelchair)?: A Lot Help needed standing up from a chair using your arms (e.g., wheelchair or bedside chair)?: A Lot Help needed to walk in hospital room?: Total Help needed climbing 3-5 steps with a railing? : Total 6 Click Score: 12    End of Session Equipment Utilized During Treatment: Gait belt Activity Tolerance: Patient limited by pain Patient left: in bed;with call bell/phone within reach;with bed alarm set;with family/visitor present Nurse Communication: Mobility status PT Visit Diagnosis: Other abnormalities of gait and mobility (R26.89);Muscle weakness (generalized) (M62.81);Pain Pain - part of body:  (back)    Time: 5681-2751 PT Time Calculation (min) (ACUTE ONLY): 28 min   Charges:   PT Evaluation $PT Eval Moderate Complexity: 1 Mod PT Treatments $Therapeutic Activity: 8-22 mins        Abran Richard, PT Acute Rehab [facility] [phone]   Karlton Lemon 11/15/2021, 1:36 PM

## 2021-11-15 NOTE — Progress Notes (Signed)
PHARMACY CONSULT NOTE FOR:  OUTPATIENT  PARENTERAL ANTIBIOTIC THERAPY (OPAT)  Indication: Acute OM of lumbar spine Regimen:  - Daptomycin '600mg'$  IV daily - Ceftriaxone 2g IV daily End date: 12/25/20  IV antibiotic discharge orders are pended. To discharging provider:  please sign these orders via discharge navigator,  Select New Orders & click on the button choice - Manage This Unsigned Work.     Thank you for allowing pharmacy to be a part of this patient's care.  Titus Dubin, PharmD PGY1 Pharmacy Resident 11/15/2021 10:42 AM

## 2021-11-16 DIAGNOSIS — M4626 Osteomyelitis of vertebra, lumbar region: Secondary | ICD-10-CM | POA: Diagnosis not present

## 2021-11-16 MED ORDER — FENTANYL 12 MCG/HR TD PT72
1.0000 | MEDICATED_PATCH | TRANSDERMAL | Status: DC
  Administered 2021-11-16: 1 via TRANSDERMAL
  Filled 2021-11-16: qty 1

## 2021-11-16 MED ORDER — DEXAMETHASONE 4 MG PO TABS
4.0000 mg | ORAL_TABLET | Freq: Every day | ORAL | Status: DC
  Administered 2021-11-18: 4 mg via ORAL
  Filled 2021-11-16: qty 1

## 2021-11-16 MED ORDER — SODIUM CHLORIDE 0.9% FLUSH: 10.0000 mL | INTRAVENOUS | Status: DC | PRN

## 2021-11-16 MED ORDER — CHLORHEXIDINE GLUCONATE CLOTH 2 % EX PADS
6.0000 | MEDICATED_PAD | Freq: Every day | CUTANEOUS | Status: DC
  Administered 2021-11-16 – 2021-11-18 (×3): 6 via TOPICAL

## 2021-11-16 MED ORDER — SODIUM CHLORIDE 0.9% FLUSH
10.0000 mL | Freq: Two times a day (BID) | INTRAVENOUS | Status: DC
  Administered 2021-11-16 – 2021-11-18 (×3): 10 mL

## 2021-11-16 MED ORDER — SORBITOL 70 % SOLN
30.0000 mL | Freq: Once | Status: AC
  Administered 2021-11-16: 30 mL via ORAL
  Filled 2021-11-16: qty 30

## 2021-11-16 MED ORDER — DEXAMETHASONE 4 MG PO TABS
4.0000 mg | ORAL_TABLET | Freq: Two times a day (BID) | ORAL | Status: AC
  Administered 2021-11-16 – 2021-11-17 (×4): 4 mg via ORAL
  Filled 2021-11-16 (×4): qty 1

## 2021-11-16 NOTE — Progress Notes (Signed)
PROGRESS NOTE  Cameron [name] XFG:[id number] DOB: 06/16/1950 DOA: 11/11/2021 PCP: Haywood Pao, MD   LOS: 4 days   Brief Narrative / Interim history: Patient is a 71 year old male with history of testicular cancer s/p resection, radiation, BPH, anxiety, tremor who presented here with complaint of severe back pain which started after spliting/stacking of woods on 9/2.  Patient's pain progressively worsened and he was seen by orthopedics and was prescribed prednisone, muscle relaxants but his pain did not improve so he presented to the emergency department.  Patient described the pain as severe, radiating from low back to the right leg, worse with any movement.  He was found to be afebrile and hemodynamically stable on presentation.  MRI of the lumbar spine was concerning for discitis/osteomyelitis of L2-L3 without any discrete abscess.  Blood culture sent.  IR consulted for disc aspiration.  ID following, on IV antibiotics.  PT/OT consulted  Significant events: 9/9-admission to the hospital 9/11-L2-L3 disc aspiration  Significant imaging / results / micro data: Disc aspirate 9/11-no growth, no organisms Blood cultures 9/10-no growth  Subjective / 24h Interval events: Persistent low back pain.  Assesement and Plan: Principal Problem:   Acute osteomyelitis of lumbar spine ([place]) Active Problems:   Discitis   Principal problem Intractable back pain, discitis/osteomyelitis of L2-L3-pain is continues to be severe today.  This is overall quite difficult to manage given how sharp and acute the pain is.  It is relieved by Dilaudid however he is persistently sleepy and fairly good unless he tries to move.  He does not feel like muscle relaxers have helped and they have now been discontinued.  He also does not feel like the lidocaine patch is helping any, discontinue.  Gabapentin seems to help.  Discussed at bedside with him and his wife, I will attempt a fentanyl patch instead of the  lidocaine patch, low-dose, and see if we can avoid IV Dilaudid. -Also placed on steroids for few days, discussed with ID and few days should be fine.  Will place on 4 mg of Decadron Q12 for 2 days, then 4 mg daily for 2 days. -MRI on admission was concerning for discitis/osteomyelitis L2-L3 without any abscesses.  Underwent aspiration by IR 9/11, cultures pending, still without growth.  ID following as well, not recommending 6 weeks of daptomycin and ceftriaxone, PICC line to be placed  Active problems Anxiety-continue Zoloft, clonazepam  History of tremor-continue home regimen  Scheduled Meds:  Chlorhexidine Gluconate Cloth  6 each Topical Daily   clonazepam  0.25 mg Oral QHS   diclofenac  1 patch Transdermal BID   fentaNYL  1 patch Transdermal Q72H   gabapentin  300 mg Oral TID   pantoprazole  40 mg Oral Daily   polyethylene glycol  17 g Oral Daily   primidone  100 mg Oral q1600   primidone  150 mg Oral BID   senna-docusate  1 tablet Oral BID   sertraline  50 mg Oral Daily   sodium chloride flush  10-40 mL Intracatheter Q12H   Continuous Infusions:  cefTRIAXone (ROCEPHIN)  IV 2 g (11/15/21 1445)   DAPTOmycin (CUBICIN) 600 mg in sodium chloride 0.9 % IVPB Stopped (11/16/21 0657)   methocarbamol (ROBAXIN) IV Stopped (11/16/21 0757)   PRN Meds:.acetaminophen, HYDROmorphone (DILAUDID) injection, HYDROmorphone, methocarbamol (ROBAXIN) IV, ondansetron **OR** ondansetron (ZOFRAN) IV, sodium chloride flush  Current Outpatient Medications  Medication Instructions   Cholecalciferol (VITAMIN D-3) 125 MCG (5000 UT) TABS 1 tablet, Oral, Daily   clonazePAM (KLONOPIN)  0.25 mg, Oral, Daily at bedtime   Coenzyme Q10 (CO Q 10 PO) 1 tablet, Oral, Daily   meclizine (ANTIVERT) 25 mg, Oral, 4 times daily PRN   meloxicam (MOBIC) 7.5 mg, Oral, Daily   omeprazole (PRILOSEC OTC) 20 mg, Oral, Daily   predniSONE (STERAPRED UNI-PAK 21 TAB) 10-60 mg, Oral, As directed, Take 6 tablets on Day 1<BR>Take 5  tablets on Day 2<BR>Take 4 tablets on Day 3<BR>Take 3 tablets on Day 4<BR>Take 2 tablets on Day 5<BR>Take 1 tablet on Day 6   primidone (MYSOLINE) 50 MG tablet TAKE 3 TABLETS BY MOUTH EVERY MORNING, 2 TABLET IN THE AFTERNOON AND 3 IN THE EVENING   rosuvastatin (CRESTOR) 20 MG tablet TAKE 1 TABLET BY MOUTH EVERY DAY   sertraline (ZOLOFT) 50 mg, Oral, Daily    Diet Orders (From admission, onward)     Start     Ordered   11/13/21 1247  Diet regular Room service appropriate? Yes; Fluid consistency: Thin  Diet effective now       Question Answer Comment  Room service appropriate? Yes   Fluid consistency: Thin      11/13/21 1246            DVT prophylaxis: SCDs Start: 11/11/21 2140   Lab Results  Component Value Date   PLT 158 11/15/2021      Code Status: Full Code  Family Communication: Wife at bedside  Status is: Inpatient Remains inpatient appropriate because: Severe pain   Level of care: Med-Surg  Consultants:  ID  Objective: Vitals:   11/15/21 0345 11/15/21 1440 11/15/21 2029 11/16/21 0501  BP: (!) 134/57 126/69 (!) 146/68 135/66  Pulse: 82 74 60 75  Resp: '18 19 18 16  '$ Temp: 97.9 F (36.6 C) 98.1 F (36.7 C) 98 F (36.7 C) 98.5 F (36.9 C)  TempSrc: Oral Oral Oral Oral  SpO2: 91% 96% 93% 91%  Weight:      Height:        Intake/Output Summary (Last 24 hours) at 11/16/2021 1230 Last data filed at 11/16/2021 0500 Gross per 24 hour  Intake --  Output 300 ml  Net -300 ml    Wt Readings from Last 3 Encounters:  11/11/21 75.8 kg  08/15/21 76.3 kg  05/02/21 76.3 kg    Examination:  Constitutional: NAD Eyes: lids and conjunctivae normal, no scleral icterus ENMT: mmm Neck: normal, supple Respiratory: clear to auscultation bilaterally, no wheezing, no crackles. Normal respiratory effort.  Cardiovascular: Regular rate and rhythm, no murmurs / rubs / gallops. No LE edema. Abdomen: soft, no distention, no tenderness. Bowel sounds positive.  Skin: no  rashes  Data Reviewed: I have independently reviewed following labs and imaging studies   CBC Recent Labs  Lab 11/11/21 1540 11/12/21 0544 11/15/21 0506  WBC 10.3 10.3 6.7  HGB 14.2 13.2 13.2  HCT 41.7 39.8 38.7*  PLT 213 195 158  MCV 94.8 97.5 97.2  MCH 32.3 32.4 33.2  MCHC 34.1 33.2 34.1  RDW 13.5 14.0 13.2  LYMPHSABS 1.0  --   --   MONOABS 0.4  --   --   EOSABS 0.0  --   --   BASOSABS 0.0  --   --      Recent Labs  Lab 11/11/21 1540 11/11/21 2254 11/12/21 0544 11/13/21 0500 11/15/21 0506  NA 141  --  141  --  138  K 4.1  --  4.0  --  3.8  CL 108  --  111  --  102  CO2 26  --  26  --  30  GLUCOSE 117*  --  96  --  105*  BUN 10  --  13  --  12  CREATININE 0.99  --  0.98  --  0.85  CALCIUM 8.9  --  8.7*  --  8.6*  CRP  --  0.5 <0.5 <0.5  --   INR  --   --   --  1.0  --      ------------------------------------------------------------------------------------------------------------------ No results for input(s): "CHOL", "HDL", "LDLCALC", "TRIG", "CHOLHDL", "LDLDIRECT" in the last 72 hours.  No results found for: "HGBA1C" ------------------------------------------------------------------------------------------------------------------ No results for input(s): "TSH", "T4TOTAL", "T3FREE", "THYROIDAB" in the last 72 hours.  Invalid input(s): "FREET3"  Cardiac Enzymes No results for input(s): "CKMB", "TROPONINI", "MYOGLOBIN" in the last 168 hours.  Invalid input(s): "CK" ------------------------------------------------------------------------------------------------------------------ No results found for: "BNP"  CBG: No results for input(s): "GLUCAP" in the last 168 hours.  Recent Results (from the past 240 hour(s))  Culture, blood (Routine X 2) w Reflex to ID Panel     Status: None (Preliminary result)   Collection Time: 11/11/21 10:54 PM   Specimen: BLOOD LEFT WRIST  Result Value Ref Range Status   Specimen Description   Final    BLOOD LEFT  WRIST Performed at [facility] [address], [place], [place] [address]    Special Requests   Final    BOTTLES DRAWN AEROBIC AND ANAEROBIC Blood Culture results may not be optimal due to an excessive volume of blood received in culture bottles Performed at [facility] [address], [place], [place] [address]    Culture   Final    NO GROWTH 4 DAYS Performed at [facility], [place] [address], [place], [place] [address]    Report Status PENDING  Incomplete  Urine Culture     Status: Abnormal   Collection Time: 11/11/21 11:14 PM   Specimen: Urine, Clean Catch  Result Value Ref Range Status   Specimen Description   Final    URINE, CLEAN CATCH Performed at [facility], [place] [address], [place], [place] [address]    Special Requests   Final    NONE Performed at [facility], [place] [address], [place], [place] [address]    Culture MULTIPLE SPECIES PRESENT, SUGGEST RECOLLECTION (A)  Final   Report Status 11/13/2021 FINAL  Final  Culture, blood (Routine X 2) w Reflex to ID Panel     Status: None (Preliminary result)   Collection Time: 11/12/21  5:44 AM   Specimen: BLOOD LEFT HAND  Result Value Ref Range Status   Specimen Description   Final    BLOOD LEFT HAND Performed at [facility] [address] [place], [place] [address]    Special Requests   Final    BOTTLES DRAWN AEROBIC AND ANAEROBIC Blood Culture adequate volume Performed at [facility] [address] [place], [place] [address]    Culture   Final    NO GROWTH 4 DAYS Performed at [facility], [place] [address], [place], [place] [address]    Report Status PENDING  Incomplete  Aerobic/Anaerobic Culture w Gram Stain (surgical/deep wound)     Status: None (Preliminary result)   Collection Time: 11/13/21 11:53 AM   Specimen: Abscess  Result Value Ref Range Status   Specimen Description   Final     ABSCESS Performed at [facility], [place] [address] [place], [place] [address]    Special Requests INTERVERTEBRAL DISC  Final   Gram Stain   Final    RARE WBC PRESENT,BOTH PMN AND MONONUCLEAR NO ORGANISMS SEEN    Culture   Final    NO GROWTH 3 DAYS NO ANAEROBES ISOLATED; CULTURE IN PROGRESS FOR 5 DAYS Performed at [facility], [place] [address] [place], [place] [address]    Report Status PENDING  Incomplete  Culture, fungus without smear     Status: None (Preliminary result)   Collection Time: 11/13/21 11:53 AM   Specimen: Back; Other  Result Value Ref Range Status   Specimen Description   Final    BACK DISC L2 L3 Performed at [facility] [address], [place], [place] [address]    Special Requests   Final    NONE Performed at [facility], [place] [address], [place], [place] [address]    Culture   Final    NO GROWTH 1 DAY Performed at [facility], [place] [address], [place], [place] [address]    Report Status PENDING  Incomplete  MRSA Next Gen by PCR, Nasal     Status: None   Collection Time: 11/13/21  1:07 PM   Specimen: Nasal Mucosa; Nasal Swab  Result Value Ref Range Status   MRSA by PCR Next Gen NOT DETECTED NOT DETECTED Final    Comment: (NOTE) The GeneXpert MRSA Assay (FDA approved for NASAL specimens only), is one component of a comprehensive MRSA colonization surveillance program. It is not intended to diagnose MRSA infection nor to guide or monitor treatment for MRSA infections. Test performance is not FDA approved in patients less than 49 years old. Performed at [facility], [place] [address], [place],  [address]      Radiology Studies: No results found.   Marzetta Board, MD, PhD [facility]  Between 7 am - 7 pm I am available, please contact me via Amion (for emergencies) or Securechat (non urgent messages)  Between 7 pm - 7 am I am not available, please  contact night coverage MD/APP via Amion

## 2021-11-16 NOTE — Progress Notes (Signed)
Peripherally Inserted Central Catheter Placement  The IV Nurse has discussed with the patient and/or persons authorized to consent for the patient, the purpose of this procedure and the potential benefits and risks involved with this procedure.  The benefits include less needle sticks, lab draws from the catheter, and the patient may be discharged home with the catheter. Risks include, but not limited to, infection, bleeding, blood clot (thrombus formation), and puncture of an artery; nerve damage and irregular heartbeat and possibility to perform a PICC exchange if needed/ordered by physician.  Alternatives to this procedure were also discussed.  Bard Power PICC patient education guide, fact sheet on infection prevention and patient information card has been provided to patient /or left at bedside.    PICC Placement Documentation  PICC Single Lumen 18/98/42 Right Basilic 43 cm 0 cm (Active)  Indication for Insertion or Continuance of Line Home intravenous therapies (PICC only) 11/16/21 1115  Exposed Catheter (cm) 0 cm 11/16/21 1115  Site Assessment Clean, Dry, Intact 11/16/21 1115  Line Status Flushed;Saline locked;Blood return noted 11/16/21 1115  Dressing Type Transparent;Securing device 11/16/21 1115  Dressing Status Antimicrobial disc in place 11/16/21 1115  Safety Lock Not Applicable 01/03/27 1188  Line Care Connections checked and tightened 11/16/21 1115  Line Adjustment (NICU/IV Team Only) No 11/16/21 1115  Dressing Intervention New dressing 11/16/21 1115  Dressing Change Due 11/23/21 11/16/21 [place] 11/16/2021, 11:16 AM

## 2021-11-16 NOTE — Care Management Important Message (Signed)
Important Message  Patient Details IM Letter given to the Patient. Name: Cameron [name] MRN: [id number] Date of Birth: 10-06-1950   Medicare Important Message Given:  Yes     Kerin Salen 11/16/2021, 9:50 AM

## 2021-11-16 NOTE — Progress Notes (Signed)
[facility] IP rehab admissions - I spoke with patient's wife by phone.  She is hopeful for a discharge home but is open to inpatient rehab if needed.  I will follow progress and have my partner check back tomorrow.  Call me for questions.  [phone]

## 2021-11-16 NOTE — PMR Pre-admission (Signed)
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 Date: 02/02/18     Deduct: $1600      Out of Pocket Max: none      Life Max: N/A CIR: 100%      SNF: 100 days Outpatient:       Co-Pay:   Home Health: 100%      Co-Pay: none DME:       Co-Pay:   Providers: patient's choice  SECONDARY: [employer]      Policy#: [id number]     Phone#: [phone]  Financial Counselor:       Phone#:   The "Data Collection Information Summary" for patients in Inpatient Rehabilitation Facilities with attached "Privacy Act [place] Records" was provided and verbally reviewed with: Patient  Emergency Contact Information Contact Information     Name Relation Home Work Mobile   Cala,[name] Spouse   [phone]       Current Medical History  Patient Admitting Diagnosis:  Lumbar L2-L3 discitis and osteomyelitis   History of Present Illness:  Cameron [name] is a 71 year old right-handed male with history of testicular cancer status postresection/orchiectomy 2005 and radiation therapy/BPH/anxiety/essential tremor maintained on Mysoline, tobacco use.  Per chart review patient lives with spouse.  Independent prior to admission.  Presented to [facility] 11/11/2021 with severe back pain that he experienced after splitting and stacking wood that slowly progressed.  He initially did see the orthopedic clinic started on steroid and muscle relaxer.  Unfortunately pain continued to worsen to the point he could not ambulate with radiating pain to the right  lower extremity.  MRI lumbar spine showed fluid in the L2-3 disc space with some enhancement within the disc space and mild endplate enhancement.  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  Moderate to severe right and moderate left foraminal stenosis at L2-3.  Moderate left and mild right foraminal stenosis L3-4 as well as moderate to severe left subarticular and foraminal stenosis L4-5 and narrowing L4-5.  Admission chemistries unremarkable, sedimentation rate of 1, urinalysis negative.  Blood cultures no growth to date.  Underwent L2-3 disc aspiration per interventional radiology.  Aspiration culture currently with no growth so far.  Infectious disease Dr.Manandhar and placed on intravenous daptomycin and ceftriaxone x6 weeks ending 12/25/2021.  He is completing a Decadron taper.  Therapy evaluations completed due to patient decreased functional mobility was admitted for a comprehensive rehab programA very pleasant 71 y.o. male with medical history significant for testicular cancer status postresection and radiation, BPH, anxiety, and tremor, who presented to the emergency department with severe back pain.  Patient was splitting and stacking wood on 11/04/2021 and was experiencing low back pain the following day that he attributed to the work he had been doing.  Pain progressively worsened, eventually prompting him to seek  evaluation at an orthopedic clinic yesterday where he was started on steroid and muscle relaxer.  Unfortunately, pain continued to worsen and he is unable to ambulate due to the severe pain.  Pain is severe, radiating from the low back down the right leg, worse with any movement, and without any alleviating factors identified.  He denies any lower extremity numbness or weakness, denies fevers or chills, and denies change in bowel or bladder habits.  ED Course: Upon arrival to the ED, patient is found to be afebrile and saturating well on room air with stable blood pressure.  Chemistry panel and  CBC are unremarkable.  MRI of the lumbar spine is concerning for early discitis osteomyelitis and L2-3 without discrete abscess.  Patient was given a liter of saline, Dilaudid, Decadron, diclofenac in the emergency department.  ED PA discussed the case with neurosurgery who did not feel that they needed to be involved at this point.Underwent an IR guided lumbar disc L2-L3 disc aspiration on 11/13/21.  PT/OT evaluations completed with recommendations for acute inpatient rehab admission.  Patient[facility] from [facility] has been reviewed by the rehabilitation admission coordinator and physician.  Past Medical History  Past Medical History:  Diagnosis Date   Arthritis    Benign essential tremor    BPH (benign prostatic hyperplasia)    Concussion 1968   no residual from   COPD (chronic obstructive pulmonary disease) (HCC)    no inhalers   COVID 03/04/2020   runny nose  at times  loss of taste and smell still present   GERD (gastroesophageal reflux disease)    History of hiatal hernia    small per dr Osborne Casco   History of testicular cancer 2005   right testicle removed and 15 radiation tx   Hx of gynecomastia    due to hcg shots for male infertility   Insomnia    Nocturia    Tobacco abuse    Varicocele    Wears glasses    for reading    Has the patient had major surgery during 100 days prior to admission? Yes  Family History   family history includes High Cholesterol in his sister; Hypertension in his sister and sister; Memory loss in his sister; Prostate cancer in his brother and father; Stroke in his mother.  Current Medications  Current Facility-Administered Medications:    acetaminophen (TYLENOL) tablet 650 mg, 650 mg, Oral, Q6H PRN, Opyd, Ilene Qua, MD   cefTRIAXone (ROCEPHIN) 2 g in sodium chloride 0.9 % 100 mL IVPB, 2 g, Intravenous, Q24H, Manandhar, Sabina, MD, Last Rate: 200 mL/hr at 11/16/21 1327, 2 g at 11/16/21 1327   Chlorhexidine Gluconate  Cloth 2 % PADS 6 each, 6 each, Topical, Daily, Gherghe, Costin M, MD   clonazepam (KLONOPIN) disintegrating tablet 0.25 mg, 0.25 mg, Oral, QHS, Opyd, Timothy S, MD, 0.25 mg at 11/15/21 2141   DAPTOmycin (CUBICIN) 600 mg in sodium chloride 0.9 % IVPB, 8 mg/kg, Intravenous, Q2000, Rosiland Oz, MD, Stopped at 11/16/21 0657   dexamethasone (DECADRON) tablet 4 mg, 4 mg, Oral, Q12H, 4 mg at 11/16/21 1320 **FOLLOWED BY** [START ON 11/18/2021] dexamethasone (DECADRON) tablet 4 mg, 4 mg, Oral, Daily, Gherghe, Costin M, MD   diclofenac (FLECTOR) 1.3 % 1 patch, 1 patch, Transdermal, BID, Opyd, Ilene Qua, MD, 1 patch at 11/16/21 0932   fentaNYL (DURAGESIC) 12 MCG/HR 1 patch, 1 patch, Transdermal, Q72H, Caren Griffins, MD, 1 patch at 11/16/21 1038   gabapentin (NEURONTIN) capsule 300  mg, 300 mg, Oral, TID, Tawanna Solo, Amrit, MD, 300 mg at 11/16/21 0926   HYDROmorphone (DILAUDID) injection 1 mg, 1 mg, Intravenous, Q3H PRN, Tawanna Solo, Amrit, MD, 1 mg at 11/16/21 1321   HYDROmorphone (DILAUDID) tablet 4 mg, 4 mg, Oral, Q4H PRN, Caren Griffins, MD, 4 mg at 11/16/21 1508   methocarbamol (ROBAXIN) 500 mg in dextrose 5 % 50 mL IVPB, 500 mg, Intravenous, Q6H PRN, Opyd, Ilene Qua, MD, Stopped at 11/16/21 0757   ondansetron (ZOFRAN) tablet 4 mg, 4 mg, Oral, Q6H PRN **OR** ondansetron (ZOFRAN) injection 4 mg, 4 mg, Intravenous, Q6H PRN, Opyd, Ilene Qua, MD, 4 mg at 11/12/21 1434   pantoprazole (PROTONIX) EC tablet 40 mg, 40 mg, Oral, Daily, Opyd, Ilene Qua, MD, 40 mg at 11/16/21 0926   polyethylene glycol (MIRALAX / GLYCOLAX) packet 17 g, 17 g, Oral, Daily, Adhikari, Amrit, MD, 17 g at 11/16/21 0938   primidone (MYSOLINE) tablet 100 mg, 100 mg, Oral, q1600, Opyd, Ilene Qua, MD, 100 mg at 11/15/21 1558   primidone (MYSOLINE) tablet 150 mg, 150 mg, Oral, BID, Opyd, Ilene Qua, MD, 150 mg at 11/16/21 0926   senna-docusate (Senokot-S) tablet 1 tablet, 1 tablet, Oral, BID, Caren Griffins, MD, 1 tablet at 11/16/21 0926    sertraline (ZOLOFT) tablet 50 mg, 50 mg, Oral, Daily, Opyd, Ilene Qua, MD, 50 mg at 11/16/21 0926   sodium chloride flush (NS) 0.9 % injection 10-40 mL, 10-40 mL, Intracatheter, Q12H, Gherghe, Costin M, MD, 10 mL at 11/16/21 1321   sodium chloride flush (NS) 0.9 % injection 10-40 mL, 10-40 mL, Intracatheter, PRN, Caren Griffins, MD  Patients Current Diet:  Diet Order             Diet regular Room service appropriate? Yes; Fluid consistency: Thin  Diet effective now                   Precautions / Restrictions Precautions Precautions: Fall, Back Precaution Booklet Issued: Yes (comment) Precaution Comments: Back precautions for pain control (no formal order) Restrictions Weight Bearing Restrictions: No   Has the patient had 2 or more falls or a fall with injury in the past year? No  Prior Activity Level Community (5-7x/wk): Went out daily, was driving, splits wood regularly.  Prior Functional Level Self Care: Did the patient need help bathing, dressing, using the toilet or eating? Independent  Indoor Mobility: Did the patient need assistance with walking from room to room (with or without device)? Independent  Stairs: Did the patient need assistance with internal or external stairs (with or without device)? Independent  Functional Cognition: Did the patient need help planning regular tasks such as shopping or remembering to take medications? Independent  Patient Information Are you of Hispanic, Latino/a,or Spanish origin?: A. No, not of Hispanic, Latino/a, or Spanish origin What is your race?: A. White Do you need or want an interpreter to communicate with a doctor or health care staff?: 0. No  Patient's Response To:  Health Literacy and Transportation Is the patient able to respond to health literacy and transportation needs?: Yes Health Literacy - How often do you need to have someone help you when you read instructions, pamphlets, or other written material from  your doctor or pharmacy?: Never In the past 12 months, has lack of transportation kept you from medical appointments or from getting medications?: No In the past 12 months, has lack of transportation kept you from meetings, work, or from getting things needed for daily living?:  No  Home Assistive Devices / Equipment Home Assistive Devices/Equipment: None Home Equipment: Grab bars - tub/shower  Prior Device Use: Indicate devices/aids used by the patient prior to current illness, exacerbation or injury? None of the above  Current Functional Level Cognition  Overall Cognitive Status: Within Functional Limits for tasks assessed    Extremity Assessment (includes Sensation/Coordination)  Upper Extremity Assessment: Overall WFL for tasks assessed  Lower Extremity Assessment: RLE deficits/detail RLE Deficits / Details: Pain increased in back with movement; ROM WFL; MMT: 2/5 throughout but limited due to pain    ADLs       Mobility  Overal bed mobility: Needs Assistance Bed Mobility: Rolling, Sidelying to Sit, Sit to Sidelying Rolling: Min assist Sidelying to sit: Min assist Sit to sidelying: Mod assist General bed mobility comments: Educated on log roll technique and performed well to sitting with min A for R leg and trunk.  However, once standing pt so painful that he needed to just lay down - required mod A for legs    Transfers  Overall transfer level: Needs assistance Equipment used: Rolling walker (2 wheels) Transfers: Sit to/from Stand Sit to Stand: Mod assist, Min guard General transfer comment: Initial min guard to rise but when pt had spasm/shooting pain he extends back and buckles at knees requiring mod A.  Transitioned to standing quickly from sitting b/c unable to tolerate sitting R hip    Ambulation / Gait / Stairs / Wheelchair Mobility  Ambulation/Gait General Gait Details: unable    Posture / Balance Dynamic Sitting Balance Sitting balance - Comments: Requiring bil  UE to lift body and keep weight off R side, for pain control not balance; pt was unable to tolerate sitting -tried leaning L but still painful Balance Overall balance assessment: Needs assistance Sitting-balance support: Bilateral upper extremity supported Sitting balance-Leahy Scale: Fair Sitting balance - Comments: Requiring bil UE to lift body and keep weight off R side, for pain control not balance; pt was unable to tolerate sitting -tried leaning L but still painful Standing balance support: Bilateral upper extremity supported Standing balance-Leahy Scale: Poor Standing balance comment: Requiring RW to support weight in arm due to severe pain in R leg; no weight on R Leg and pt still with spasms/stabbing pain causing trunk extension and knee flexion; unable to tolerate standing more than 5-10 seconds    Special needs/care consideration Continuous Drip IV  Rocephin IV antibiotics and Skin lumbar puncture site for disc aspiration post IR   Previous Home Environment (from acute therapy documentation) Living Arrangements: Spouse/significant other Available Help at Discharge: Family, Available 24 hours/day Type of Home: House Home Layout: Two level, Able to live on main level with bedroom/bathroom Home Access: Stairs to enter [employer] of Steps: in front 3 steps with rail on R; in back 5 steps with rails on both sides -can reach both but would have longer to walk Bathroom Shower/Tub: Tub/shower unit, Walk-in shower (walk in is upstairs) 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 Home Care Services: No  Discharge Living Setting Plans for Discharge Living Setting: Patient's home, House, Lives with (comment) (Lives with wife.) Type of Home at Discharge: House Discharge Home Layout: Two level, Able to live on main level with bedroom/bathroom Alternate Level Stairs-Number of Steps: Flight Discharge Home Access: Stairs to enter Entrance Stairs-Rails: Right Entrance  Stairs-Number of Steps: 3 steps up to porch Discharge Bathroom Shower/Tub: Tub/shower unit, Curtain Discharge Bathroom Toilet: Standard Discharge Bathroom Accessibility: Yes How Accessible: Accessible via walker Does  the patient have any problems obtaining your medications?: No  Social/Family/Support Systems Patient Roles: Spouse (Has a wife.) Contact Information: Rylyn Ranganathan Anticipated Caregiver: Wife - Hassan Rowan Ability/Limitations of Caregiver: Wife can assist but cannot lift patient Caregiver Availability: 24/7 Discharge Plan Discussed with Primary Caregiver: Yes Is Caregiver In Agreement with Plan?: Yes Does Caregiver/Family have Issues with Lodging/Transportation while Pt is in Rehab?: No  Goals Patient/Family Goal for Rehab: PT/OT supervision goals Expected length of stay: 7-10 days Pt/Family Agrees to Admission and willing to participate: Yes Program Orientation Provided & Reviewed with Pt/Caregiver Including Roles  & Responsibilities: Yes  Decrease burden of Care through IP rehab admission: N/A  Possible need for SNF placement upon discharge: Not anticipated  Patient Condition: I have reviewed medical records from [facility], spoken with CM, and patient and spouse. I discussed via phone for inpatient rehabilitation assessment.  Patient will benefit from ongoing PT and OT, can actively participate in 3 hours of therapy a day 5 days of the week, and can make measurable gains during the admission.  Patient will also benefit from the coordinated team approach during an Inpatient Acute Rehabilitation admission.  The patient will receive intensive therapy as well as Rehabilitation physician, nursing, social worker, and care management interventions.  Due to bladder management, bowel management, safety, skin/wound care, disease management, medication administration, pain management, and patient education the patient requires 24 hour a day rehabilitation nursing.  The  patient is currently min-mod A  with mobility and basic ADLs.  Discharge setting and therapy post discharge at home with home health is anticipated.  Patient has agreed to participate in the Acute Inpatient Rehabilitation Program and will admit today.  Preadmission Screen Completed By:  Retta Diones, 11/16/2021 3:09 PM ______________________________________________________________________   Discussed status with Dr. Tressa Busman  on 930 at 11/18/21 and received approval for admission today.  Admission Coordinator:  Retta Diones, RN, time 930/Date 11/18/21   Assessment/Plan: Diagnosis: Does the need for close, 24 hr/day Medical supervision in concert with the patient's rehab needs make it unreasonable for this patient to be served in a less intensive setting? Yes Co-Morbidities requiring supervision/potential complications: Osteomyelitis on IV abx, intractable pain, anxiety, HTN Due to safety, skin/wound care, disease management, medication administration, pain management, and patient education, does the patient require 24 hr/day rehab nursing? Yes Does the patient require coordinated care of a physician, rehab nurse, PT, OT to address physical and functional deficits in the context of the above medical diagnosis(es)? Yes Addressing deficits in the following areas: balance, endurance, locomotion, strength, transferring, bathing, dressing, and toileting Can the patient actively participate in an intensive therapy program of at least 3 hrs of therapy 5 days a week? Yes The potential for patient to make measurable gains while on inpatient rehab is good Anticipated functional outcomes upon discharge from inpatient rehab: modified independent and supervision PT, supervision OT,  Estimated rehab length of stay to reach the above functional goals is: 10-14 days Anticipated discharge destination: Home 10. Overall Rehab/Functional Prognosis: good   MD Signature:  Gertie Gowda, DO 11/18/2021

## 2021-11-17 DIAGNOSIS — M4626 Osteomyelitis of vertebra, lumbar region: Secondary | ICD-10-CM | POA: Diagnosis not present

## 2021-11-17 LAB — CULTURE, BLOOD (ROUTINE X 2)
Culture: NO GROWTH
Culture: NO GROWTH
Special Requests: ADEQUATE

## 2021-11-17 MED ORDER — TIZANIDINE HCL 4 MG PO TABS
4.0000 mg | ORAL_TABLET | Freq: Four times a day (QID) | ORAL | Status: DC | PRN
  Administered 2021-11-17 – 2021-11-18 (×4): 4 mg via ORAL
  Filled 2021-11-17 (×4): qty 1

## 2021-11-17 NOTE — Progress Notes (Signed)
Inpatient Rehab Admissions Coordinator:   I have  a CIR bed for pt TOMORROW, Saturday 11/18/21. I will set up transport in the morning, RN may call report to [phone].  Clemens Catholic, [place], [place] Admissions Coordinator  [phone] ([place]) [phone] (office)

## 2021-11-17 NOTE — TOC Initial Note (Signed)
Transition of Care [facility]) - Initial/Assessment Note    Patient Details  Name: Cameron [name] MRN: [id number] Date of Birth: [birth year]  Transition of Care [facility]) CM/SW Contact:    Vassie Moselle, LCSW Phone Number: 11/17/2021, 10:23 AM  Clinical Narrative:                 Pt is to transfer to CIR on 9/16. CIR CM is to arrange transportation for pt via [facility] in the AM.   Expected Discharge Plan: [place] Barriers to Discharge: No Barriers Identified   Patient Goals and CMS Choice     Choice offered to / list presented to : Patient  Expected Discharge Plan and Services Expected Discharge Plan: [place] In-house Referral: NA Discharge Planning Services: NA Post Acute Care Choice: IP Rehab Living arrangements for the past 2 months: Single Family Home                 DME Arranged: N/A DME Agency: NA                  Prior Living Arrangements/Services Living arrangements for the past 2 months: Single Family Home Lives with:: Spouse Patient language and need for interpreter reviewed:: Yes Do you feel safe going back to the place where you live?: Yes      Need for Family Participation in Patient Care: No (Comment) Care giver support system in place?: No (comment)   Criminal Activity/Legal Involvement Pertinent to Current Situation/Hospitalization: No - Comment as needed  Activities of Daily Living Home Assistive Devices/Equipment: None ADL Screening (condition at time of admission) Patient's cognitive ability adequate to safely complete daily activities?: Yes Is the patient deaf or have difficulty hearing?: No Does the patient have difficulty seeing, even when wearing glasses/contacts?: No Does the patient have difficulty concentrating, remembering, or making decisions?: No Patient able to express need for assistance with ADLs?: Yes Independently performs ADLs?: No Weakness of Legs: Both Weakness of Arms/Hands: None  Permission  Sought/Granted   Permission granted to share information with : No              Emotional Assessment   Attitude/Demeanor/Rapport: Unable to Assess Affect (typically observed): Unable to Assess Orientation: : Oriented to Self, Oriented to Place, Oriented to  Time, Oriented to Situation Alcohol / Substance Use: Not Applicable Psych Involvement: No (comment)  Admission diagnosis:  Acute osteomyelitis of lumbar spine ([place]) [M46.26] Discitis [M46.40] Patient Active Problem List   Diagnosis Date Noted   Discitis 11/12/2021   Acute osteomyelitis of lumbar spine ([place]) 11/11/2021   Essential tremor    Anxiety    Mixed hyperlipidemia 04/25/2018   Aortic atherosclerosis ([place]) 04/25/2018   Abnormal CT scan, chest 07/17/2011   Tobacco abuse 07/17/2011   PCP:  Haywood Pao, MD Pharmacy:   [id number] [place] [id number]: [phone]: [phone]    Social Determinants of Health (SDOH) Interventions    Readmission Risk Interventions    11/17/2021   10:22 AM  Readmission Risk Prevention Plan  Post Dischage Appt Complete  Medication Screening Complete  Transportation Screening Complete

## 2021-11-17 NOTE — Progress Notes (Signed)
PROGRESS NOTE  Cameron [name]:[id number] DOB: Apr 22, 1950 DOA: 11/11/2021 PCP: Haywood Pao, MD   LOS: 5 days   Brief Narrative / Interim history: Patient is a 71 year old male with history of testicular cancer s/p resection, radiation, BPH, anxiety, tremor who presented here with complaint of severe back pain which started after spliting/stacking of woods on 9/2.  Patient's pain progressively worsened and he was seen by orthopedics and was prescribed prednisone, muscle relaxants but his pain did not improve so he presented to the emergency department.  Patient described the pain as severe, radiating from low back to the right leg, worse with any movement.  He was found to be afebrile and hemodynamically stable on presentation.  MRI of the lumbar spine was concerning for discitis/osteomyelitis of L2-L3 without any discrete abscess.  Blood culture sent.  IR consulted for disc aspiration.  ID following, on IV antibiotics.  PT/OT consulted  Significant events: 9/9-admission to the hospital 9/11-L2-L3 disc aspiration 9/14-PICC line placement  Significant imaging / results / micro data: MRI 9/9-fluid in the L2-3 disc space with some enhancement within the disc space and mild endplate enhancement, concern for early disc osteomyelitis, paravertebral soft tissue enhancement consistent with inflammatory changes, no abscess is present. Disc aspirate 9/11-no growth Blood cultures 9/10-no growth  Subjective / 24h Interval events: Feels somewhat better today, thinks that the steroid along with fentanyl patch is working.  Still gets shooting pains once in a while but he feels like he is more mobile  Assesement and Plan: Principal Problem:   Acute osteomyelitis of lumbar spine (HCC) Active Problems:   Discitis   Principal problem Intractable back pain, discitis/osteomyelitis of L2-L3-overall his pain is better.  Currently he is on oral Dilaudid, IV Dilaudid, fentanyl patch, Decadron, and  Zanaflex which is new today.  He was on Robaxin and that did not work but he is willing to try Zanaflex.  Overall he feels more comfortable and has the courage to move more.  He still gets this sharp shooting nervelike pains, I have discussed with him today that they are usually self-limited, and he does tell me that they do not last more than 30 seconds or so.  I recommended that he should not get IV Dilaudid for this type of pain, try to avoid IV pain medications altogether today.  Continue gabapentin which he says seems to be working. -Had a lidocaine patch that did not work so it is now discontinued -CIR is evaluating him and they will will have a bed for him on Saturday, but will need to be comfortable without IV pain medications. -I have discussed with ID regarding Decadron with concern for an active infection, while not ideal we will do a short course hoping that we will get on top of the pain.  He is now on the second day of 4 mg Q12 of Decadron, 4 mg daily for the next 2 days and then stop. -MRI on admission was concerning for discitis/osteomyelitis L2-L3 without any abscesses.  Underwent aspiration by IR 9/11, cultures without growth.  ID recommending 6 weeks of daptomycin and ceftriaxone, PICC line placed 9/14  Active problems Anxiety-continue Zoloft, clonazepam  History of tremor-continue home regimen  Scheduled Meds:  Chlorhexidine Gluconate Cloth  6 each Topical Daily   clonazepam  0.25 mg Oral QHS   dexamethasone  4 mg Oral Q12H   Followed by   Derrill Memo ON 11/18/2021] dexamethasone  4 mg Oral Daily   diclofenac  1 patch Transdermal BID  fentaNYL  1 patch Transdermal Q72H   gabapentin  300 mg Oral TID   pantoprazole  40 mg Oral Daily   polyethylene glycol  17 g Oral Daily   primidone  100 mg Oral q1600   primidone  150 mg Oral BID   senna-docusate  1 tablet Oral BID   sertraline  50 mg Oral Daily   sodium chloride flush  10-40 mL Intracatheter Q12H   Continuous Infusions:   cefTRIAXone (ROCEPHIN)  IV 2 g (11/16/21 1327)   DAPTOmycin (CUBICIN) 600 mg in sodium chloride 0.9 % IVPB 600 mg (11/16/21 2035)   PRN Meds:.acetaminophen, HYDROmorphone (DILAUDID) injection, HYDROmorphone, ondansetron **OR** ondansetron (ZOFRAN) IV, sodium chloride flush, tiZANidine  Current Outpatient Medications  Medication Instructions   Cholecalciferol (VITAMIN D-3) 125 MCG (5000 UT) TABS 1 tablet, Oral, Daily   clonazePAM (KLONOPIN) 0.25 mg, Oral, Daily at bedtime   Coenzyme Q10 (CO Q 10 PO) 1 tablet, Oral, Daily   meclizine (ANTIVERT) 25 mg, Oral, 4 times daily PRN   meloxicam (MOBIC) 7.5 mg, Oral, Daily   omeprazole (PRILOSEC OTC) 20 mg, Oral, Daily   predniSONE (STERAPRED UNI-PAK 21 TAB) 10-60 mg, Oral, As directed, Take 6 tablets on Day 1<BR>Take 5 tablets on Day 2<BR>Take 4 tablets on Day 3<BR>Take 3 tablets on Day 4<BR>Take 2 tablets on Day 5<BR>Take 1 tablet on Day 6   primidone (MYSOLINE) 50 MG tablet TAKE 3 TABLETS BY MOUTH EVERY MORNING, 2 TABLET IN THE AFTERNOON AND 3 IN THE EVENING   rosuvastatin (CRESTOR) 20 MG tablet TAKE 1 TABLET BY MOUTH EVERY DAY   sertraline (ZOLOFT) 50 mg, Oral, Daily    Diet Orders (From admission, onward)     Start     Ordered   11/13/21 1247  Diet regular Room service appropriate? Yes; Fluid consistency: Thin  Diet effective now       Question Answer Comment  Room service appropriate? Yes   Fluid consistency: Thin      11/13/21 1246            DVT prophylaxis: SCDs Start: 11/11/21 2140   Lab Results  Component Value Date   PLT 158 11/15/2021      Code Status: Full Code  Family Communication: Wife at bedside  Status is: Inpatient Remains inpatient appropriate because: Severe pain   Level of care: Med-Surg  Consultants:  ID  Objective: Vitals:   11/16/21 1457 11/16/21 2023 11/17/21 0530 11/17/21 0838  BP: 120/65 (!) 142/73 (!) 143/67 127/67  Pulse: 76 74 66 63  Resp: '17 20 18   '$ Temp: 97.9 F (36.6 C) 97.7 F  (36.5 C) 98.1 F (36.7 C)   TempSrc: Oral Oral    SpO2: 91% 93% 95% 93%  Weight:      Height:        Intake/Output Summary (Last 24 hours) at 11/17/2021 1039 Last data filed at 11/16/2021 1500 Gross per 24 hour  Intake 240 ml  Output 500 ml  Net -260 ml    Wt Readings from Last 3 Encounters:  11/11/21 75.8 kg  08/15/21 76.3 kg  05/02/21 76.3 kg    Examination:  Constitutional: NAD Eyes: no scleral icterus ENMT: mmm Neck: normal, supple Respiratory: Normal respiratory effort.  Cardiovascular: No LE edema. Abdomen: non distended  Data Reviewed: I have independently reviewed following labs and imaging studies   CBC Recent Labs  Lab 11/11/21 1540 11/12/21 0544 11/15/21 0506  WBC 10.3 10.3 6.7  HGB 14.2 13.2 13.2  HCT 41.7  39.8 38.7*  PLT 213 195 158  MCV 94.8 97.5 97.2  MCH 32.3 32.4 33.2  MCHC 34.1 33.2 34.1  RDW 13.5 14.0 13.2  LYMPHSABS 1.0  --   --   MONOABS 0.4  --   --   EOSABS 0.0  --   --   BASOSABS 0.0  --   --      Recent Labs  Lab 11/11/21 1540 11/11/21 2254 11/12/21 0544 11/13/21 0500 11/15/21 0506  NA 141  --  141  --  138  K 4.1  --  4.0  --  3.8  CL 108  --  111  --  102  CO2 26  --  26  --  30  GLUCOSE 117*  --  96  --  105*  BUN 10  --  13  --  12  CREATININE 0.99  --  0.98  --  0.85  CALCIUM 8.9  --  8.7*  --  8.6*  CRP  --  0.5 <0.5 <0.5  --   INR  --   --   --  1.0  --      ------------------------------------------------------------------------------------------------------------------ No results for input(s): "CHOL", "HDL", "LDLCALC", "TRIG", "CHOLHDL", "LDLDIRECT" in the last 72 hours.  No results found for: "HGBA1C" ------------------------------------------------------------------------------------------------------------------ No results for input(s): "TSH", "T4TOTAL", "T3FREE", "THYROIDAB" in the last 72 hours.  Invalid input(s): "FREET3"  Cardiac Enzymes No results for input(s): "CKMB", "TROPONINI",  "MYOGLOBIN" in the last 168 hours.  Invalid input(s): "CK" ------------------------------------------------------------------------------------------------------------------ No results found for: "BNP"  CBG: No results for input(s): "GLUCAP" in the last 168 hours.  Recent Results (from the past 240 hour(s))  Culture, blood (Routine X 2) w Reflex to ID Panel     Status: None   Collection Time: 11/11/21 10:54 PM   Specimen: BLOOD LEFT WRIST  Result Value Ref Range Status   Specimen Description   Final    BLOOD LEFT WRIST Performed at [facility] [address], [place], [place] [address]    Special Requests   Final    BOTTLES DRAWN AEROBIC AND ANAEROBIC Blood Culture results may not be optimal due to an excessive volume of blood received in culture bottles Performed at [facility] [address], [place], [place] [address]    Culture   Final    NO GROWTH 5 DAYS Performed at [facility], [place] [address] [place], [place] [address]    Report Status 11/17/2021 FINAL  Final  Urine Culture     Status: Abnormal   Collection Time: 11/11/21 11:14 PM   Specimen: Urine, Clean Catch  Result Value Ref Range Status   Specimen Description   Final    URINE, CLEAN CATCH Performed at [facility], [place] [address], [place], [place] [address]    Special Requests   Final    NONE Performed at [facility], [place] [address] [place],  [address]    Culture MULTIPLE SPECIES PRESENT, SUGGEST RECOLLECTION (A)  Final   Report Status 11/13/2021 FINAL  Final  Culture, blood (Routine X 2) w Reflex to ID Panel     Status: None   Collection Time: 11/12/21  5:44 AM   Specimen: BLOOD LEFT HAND  Result Value Ref Range Status   Specimen Description   Final    BLOOD LEFT HAND Performed at [facility] [address] [place],  [address]    Special Requests   Final  BOTTLES DRAWN AEROBIC  AND ANAEROBIC Blood Culture adequate volume Performed at [facility] [address], [place], [place] [address]    Culture   Final    NO GROWTH 5 DAYS Performed at [facility], [place] [address], [place], [place] [address]    Report Status 11/17/2021 FINAL  Final  Aerobic/Anaerobic Culture w Gram Stain (surgical/deep wound)     Status: None (Preliminary result)   Collection Time: 11/13/21 11:53 AM   Specimen: Abscess  Result Value Ref Range Status   Specimen Description   Final    ABSCESS Performed at [facility] [address], [place], [place] [address]    Special Requests INTERVERTEBRAL DISC  Final   Gram Stain   Final    RARE WBC PRESENT,BOTH PMN AND MONONUCLEAR NO ORGANISMS SEEN    Culture   Final    NO GROWTH 4 DAYS NO ANAEROBES ISOLATED; CULTURE IN PROGRESS FOR 5 DAYS Performed at [facility], [place] [address], [place], [place] [address]    Report Status PENDING  Incomplete  Culture, fungus without smear     Status: None (Preliminary result)   Collection Time: 11/13/21 11:53 AM   Specimen: Back; Other  Result Value Ref Range Status   Specimen Description   Final    BACK DISC L2 L3 Performed at [facility] [address] [place], [place] [address]    Special Requests   Final    NONE Performed at [facility], [place] [address] [place], [place] [address]    Culture   Final    NO FUNGUS ISOLATED AFTER 2 DAYS Performed at [facility], [place] [address], [place], [place] [address]    Report Status PENDING  Incomplete  MRSA Next Gen by PCR, Nasal     Status: None   Collection Time: 11/13/21  1:07 PM   Specimen: Nasal Mucosa; Nasal Swab  Result Value Ref Range Status   MRSA by PCR Next Gen NOT DETECTED NOT DETECTED Final    Comment: (NOTE) The GeneXpert MRSA Assay (FDA approved for NASAL specimens only), is one component of a comprehensive MRSA colonization  surveillance program. It is not intended to diagnose MRSA infection nor to guide or monitor treatment for MRSA infections. Test performance is not FDA approved in patients less than 10 years old. Performed at [facility], [place] [address] [place], [place] [address]      Radiology Studies: No results found.   Marzetta Board, MD, PhD [facility]  Between 7 am - 7 pm I am available, please contact me via Amion (for emergencies) or Securechat (non urgent messages)  Between 7 pm - 7 am I am not available, please contact night coverage MD/APP via Amion

## 2021-11-17 NOTE — Progress Notes (Signed)
OPAT  Diagnosis: discitis and osteomyelitis   Culture Result: no growth to date   No Known Allergies  OPAT Orders Discharge antibiotics to be given via PICC line Discharge antibiotics: IV daptomyicn 668m iv daily and ceftriaxone 2g iv daily Per pharmacy protocol  Duration: 6 weeks End Date: 12/25/21  [facility] Protocol:  Home health RN for IV administration and teaching; PICC line care and labs.    Labs weekly while on IV antibiotics: X__ CBC with differential __ BMP X__ CMP X__ CRP and ESR every 2 weeks  __ Vancomycin trough X__ CK  __ Please pull PIC at completion of IV antibiotics X__ Please leave PIC in place until doctor has seen patient or been notified  Fax weekly labs to ([phone] Clinic Follow Up Appt: 12/04/21  SRosiland Oz MD Infectious Disease Physician [facility] Infectious Disease [address] [place] [address]Phone: [phone] Fax: [phone]

## 2021-11-18 ENCOUNTER — Encounter (HOSPITAL_COMMUNITY): Payer: Self-pay | Admitting: Physical Medicine and Rehabilitation

## 2021-11-18 ENCOUNTER — Inpatient Hospital Stay (HOSPITAL_COMMUNITY)
Admission: RE | Admit: 2021-11-18 | Discharge: 2021-12-06 | DRG: 945 | Disposition: A | Discharge status: Home Health | Payer: Medicare Other | Source: Other Acute Inpatient Hospital | Attending: Physical Medicine and Rehabilitation | Admitting: Physical Medicine and Rehabilitation

## 2021-11-18 ENCOUNTER — Other Ambulatory Visit: Payer: Self-pay

## 2021-11-18 DIAGNOSIS — M1990 Unspecified osteoarthritis, unspecified site: Secondary | ICD-10-CM | POA: Diagnosis present

## 2021-11-18 DIAGNOSIS — R5381 Other malaise: Secondary | ICD-10-CM | POA: Diagnosis present

## 2021-11-18 DIAGNOSIS — Z8042 Family history of malignant neoplasm of prostate: Secondary | ICD-10-CM | POA: Diagnosis not present

## 2021-11-18 DIAGNOSIS — Z8616 Personal history of COVID-19: Secondary | ICD-10-CM

## 2021-11-18 DIAGNOSIS — Z923 Personal history of irradiation: Secondary | ICD-10-CM

## 2021-11-18 DIAGNOSIS — R410 Disorientation, unspecified: Secondary | ICD-10-CM

## 2021-11-18 DIAGNOSIS — N400 Enlarged prostate without lower urinary tract symptoms: Secondary | ICD-10-CM | POA: Diagnosis present

## 2021-11-18 DIAGNOSIS — R471 Dysarthria and anarthria: Secondary | ICD-10-CM | POA: Diagnosis present

## 2021-11-18 DIAGNOSIS — J440 Chronic obstructive pulmonary disease with acute lower respiratory infection: Secondary | ICD-10-CM | POA: Diagnosis not present

## 2021-11-18 DIAGNOSIS — E871 Hypo-osmolality and hyponatremia: Secondary | ICD-10-CM

## 2021-11-18 DIAGNOSIS — M79604 Pain in right leg: Secondary | ICD-10-CM | POA: Diagnosis not present

## 2021-11-18 DIAGNOSIS — D649 Anemia, unspecified: Secondary | ICD-10-CM | POA: Diagnosis present

## 2021-11-18 DIAGNOSIS — M4626 Osteomyelitis of vertebra, lumbar region: Secondary | ICD-10-CM | POA: Diagnosis present

## 2021-11-18 DIAGNOSIS — K5900 Constipation, unspecified: Secondary | ICD-10-CM | POA: Diagnosis not present

## 2021-11-18 DIAGNOSIS — E782 Mixed hyperlipidemia: Secondary | ICD-10-CM | POA: Diagnosis present

## 2021-11-18 DIAGNOSIS — M4646 Discitis, unspecified, lumbar region: Secondary | ICD-10-CM | POA: Diagnosis present

## 2021-11-18 DIAGNOSIS — M4640 Discitis, unspecified, site unspecified: Secondary | ICD-10-CM | POA: Diagnosis not present

## 2021-11-18 DIAGNOSIS — G250 Essential tremor: Secondary | ICD-10-CM | POA: Diagnosis present

## 2021-11-18 DIAGNOSIS — Z9079 Acquired absence of other genital organ(s): Secondary | ICD-10-CM

## 2021-11-18 DIAGNOSIS — M62838 Other muscle spasm: Secondary | ICD-10-CM | POA: Diagnosis not present

## 2021-11-18 DIAGNOSIS — J189 Pneumonia, unspecified organism: Secondary | ICD-10-CM | POA: Diagnosis not present

## 2021-11-18 DIAGNOSIS — Z79899 Other long term (current) drug therapy: Secondary | ICD-10-CM

## 2021-11-18 DIAGNOSIS — D696 Thrombocytopenia, unspecified: Secondary | ICD-10-CM | POA: Diagnosis present

## 2021-11-18 DIAGNOSIS — Z8547 Personal history of malignant neoplasm of testis: Secondary | ICD-10-CM

## 2021-11-18 DIAGNOSIS — G4700 Insomnia, unspecified: Secondary | ICD-10-CM | POA: Diagnosis present

## 2021-11-18 DIAGNOSIS — F419 Anxiety disorder, unspecified: Secondary | ICD-10-CM | POA: Diagnosis present

## 2021-11-18 DIAGNOSIS — K219 Gastro-esophageal reflux disease without esophagitis: Secondary | ICD-10-CM | POA: Diagnosis present

## 2021-11-18 DIAGNOSIS — N50819 Testicular pain, unspecified: Secondary | ICD-10-CM | POA: Diagnosis not present

## 2021-11-18 DIAGNOSIS — F411 Generalized anxiety disorder: Secondary | ICD-10-CM | POA: Diagnosis not present

## 2021-11-18 DIAGNOSIS — E876 Hypokalemia: Secondary | ICD-10-CM

## 2021-11-18 DIAGNOSIS — M5416 Radiculopathy, lumbar region: Secondary | ICD-10-CM | POA: Diagnosis not present

## 2021-11-18 DIAGNOSIS — F17219 Nicotine dependence, cigarettes, with unspecified nicotine-induced disorders: Secondary | ICD-10-CM | POA: Diagnosis not present

## 2021-11-18 DIAGNOSIS — F17210 Nicotine dependence, cigarettes, uncomplicated: Secondary | ICD-10-CM | POA: Diagnosis present

## 2021-11-18 DIAGNOSIS — Z716 Tobacco abuse counseling: Secondary | ICD-10-CM

## 2021-11-18 DIAGNOSIS — K5903 Drug induced constipation: Secondary | ICD-10-CM | POA: Diagnosis not present

## 2021-11-18 DIAGNOSIS — R770 Abnormality of albumin: Secondary | ICD-10-CM | POA: Diagnosis not present

## 2021-11-18 LAB — AEROBIC/ANAEROBIC CULTURE W GRAM STAIN (SURGICAL/DEEP WOUND): Culture: NO GROWTH

## 2021-11-18 MED ORDER — GABAPENTIN 300 MG PO CAPS
600.0000 mg | ORAL_CAPSULE | Freq: Three times a day (TID) | ORAL | Status: DC
  Administered 2021-11-18 – 2021-11-21 (×9): 600 mg via ORAL
  Filled 2021-11-18 (×9): qty 2

## 2021-11-18 MED ORDER — DAPTOMYCIN IV (FOR PTA / DISCHARGE USE ONLY): 600.0000 mg | INTRAVENOUS | 0 refills | Status: DC

## 2021-11-18 MED ORDER — GABAPENTIN 400 MG PO CAPS: 400.0000 mg | ORAL_CAPSULE | Freq: Three times a day (TID) | ORAL | Status: DC

## 2021-11-18 MED ORDER — POLYETHYLENE GLYCOL 3350 17 G PO PACK
17.0000 g | PACK | Freq: Every day | ORAL | Status: DC
  Administered 2021-11-19 – 2021-11-23 (×3): 17 g via ORAL
  Filled 2021-11-18 (×6): qty 1

## 2021-11-18 MED ORDER — CLONAZEPAM 0.25 MG PO TBDP
0.2500 mg | ORAL_TABLET | Freq: Every day | ORAL | Status: DC
  Administered 2021-11-18: 0.25 mg via ORAL
  Filled 2021-11-18: qty 1

## 2021-11-18 MED ORDER — SENNOSIDES-DOCUSATE SODIUM 8.6-50 MG PO TABS: 1.0000 | ORAL_TABLET | Freq: Two times a day (BID) | ORAL | 3 refills | Status: DC

## 2021-11-18 MED ORDER — SERTRALINE HCL 50 MG PO TABS
50.0000 mg | ORAL_TABLET | Freq: Every day | ORAL | Status: DC
  Administered 2021-11-19 – 2021-11-24 (×6): 50 mg via ORAL
  Filled 2021-11-18 (×8): qty 1

## 2021-11-18 MED ORDER — HYDROMORPHONE HCL 2 MG PO TABS
4.0000 mg | ORAL_TABLET | ORAL | Status: DC | PRN
  Administered 2021-11-18 – 2021-12-06 (×42): 4 mg via ORAL
  Filled 2021-11-18 (×44): qty 2

## 2021-11-18 MED ORDER — METHOCARBAMOL 500 MG PO TABS: 500.0000 mg | ORAL_TABLET | Freq: Four times a day (QID) | ORAL | Status: DC | PRN

## 2021-11-18 MED ORDER — ACETAMINOPHEN 325 MG PO TABS
650.0000 mg | ORAL_TABLET | Freq: Four times a day (QID) | ORAL | Status: DC | PRN
  Administered 2021-11-19 – 2021-12-04 (×11): 650 mg via ORAL
  Filled 2021-11-18 (×13): qty 2

## 2021-11-18 MED ORDER — METHOCARBAMOL 500 MG PO TABS: 500.0000 mg | ORAL_TABLET | Freq: Three times a day (TID) | ORAL | Status: DC | PRN

## 2021-11-18 MED ORDER — HYDROMORPHONE HCL 4 MG PO TABS: 4.0000 mg | ORAL_TABLET | ORAL | 0 refills | Status: DC | PRN

## 2021-11-18 MED ORDER — DICLOFENAC EPOLAMINE 1.3 % EX PTCH
1.0000 | MEDICATED_PATCH | Freq: Two times a day (BID) | CUTANEOUS | Status: DC
  Administered 2021-11-18 – 2021-12-06 (×36): 1 via TRANSDERMAL
  Filled 2021-11-18 (×38): qty 1

## 2021-11-18 MED ORDER — TIZANIDINE HCL 4 MG PO TABS: 4.0000 mg | ORAL_TABLET | Freq: Four times a day (QID) | ORAL | 1 refills | Status: DC | PRN

## 2021-11-18 MED ORDER — PRIMIDONE 50 MG PO TABS: 150.0000 mg | ORAL_TABLET | Freq: Two times a day (BID) | ORAL | Status: DC

## 2021-11-18 MED ORDER — CEFTRIAXONE IV (FOR PTA / DISCHARGE USE ONLY): 2.0000 g | INTRAVENOUS | 0 refills | Status: DC

## 2021-11-18 MED ORDER — SODIUM CHLORIDE 0.9 % IV SOLN
2.0000 g | INTRAVENOUS | Status: DC
  Administered 2021-11-19 – 2021-11-30 (×12): 2 g via INTRAVENOUS
  Filled 2021-11-18 (×13): qty 20

## 2021-11-18 MED ORDER — SODIUM CHLORIDE 0.9% FLUSH
10.0000 mL | INTRAVENOUS | Status: DC | PRN
  Administered 2021-11-19 – 2021-11-27 (×3): 10 mL

## 2021-11-18 MED ORDER — ONDANSETRON HCL 4 MG/2ML IJ SOLN: 4.0000 mg | Freq: Four times a day (QID) | INTRAMUSCULAR | Status: DC | PRN

## 2021-11-18 MED ORDER — TIZANIDINE HCL 4 MG PO TABS
4.0000 mg | ORAL_TABLET | Freq: Every day | ORAL | Status: DC
  Administered 2021-11-18: 4 mg via ORAL
  Filled 2021-11-18: qty 1

## 2021-11-18 MED ORDER — PANTOPRAZOLE SODIUM 40 MG PO TBEC
40.0000 mg | DELAYED_RELEASE_TABLET | Freq: Every day | ORAL | Status: DC
  Administered 2021-11-19 – 2021-12-06 (×18): 40 mg via ORAL
  Filled 2021-11-18 (×19): qty 1

## 2021-11-18 MED ORDER — PRIMIDONE 50 MG PO TABS
100.0000 mg | ORAL_TABLET | Freq: Every day | ORAL | Status: DC
  Administered 2021-11-18 – 2021-12-05 (×17): 100 mg via ORAL
  Filled 2021-11-18 (×18): qty 2

## 2021-11-18 MED ORDER — FENTANYL 12 MCG/HR TD PT72
1.0000 | MEDICATED_PATCH | TRANSDERMAL | Status: DC
  Administered 2021-11-19: 1 via TRANSDERMAL
  Filled 2021-11-18: qty 1

## 2021-11-18 MED ORDER — LICART 1.3 % EX PT24: MEDICATED_PATCH | CUTANEOUS | 0 refills | Status: DC

## 2021-11-18 MED ORDER — SODIUM CHLORIDE 0.9 % IV SOLN
8.0000 mg/kg | Freq: Every day | INTRAVENOUS | Status: DC
  Administered 2021-11-18 – 2021-11-30 (×13): 600 mg via INTRAVENOUS
  Filled 2021-11-18 (×13): qty 12
  Filled 2021-11-18: qty 10

## 2021-11-18 MED ORDER — ORAL CARE MOUTH RINSE: 15.0000 mL | OROMUCOSAL | Status: DC | PRN

## 2021-11-18 MED ORDER — METHOCARBAMOL 500 MG PO TABS: 500.0000 mg | ORAL_TABLET | Freq: Every day | ORAL | Status: DC

## 2021-11-18 MED ORDER — PANTOPRAZOLE SODIUM 40 MG PO TBEC: 40.0000 mg | DELAYED_RELEASE_TABLET | Freq: Every day | ORAL | 3 refills | Status: DC

## 2021-11-18 MED ORDER — ONDANSETRON HCL 4 MG PO TABS: 4.0000 mg | ORAL_TABLET | Freq: Four times a day (QID) | ORAL | Status: DC | PRN

## 2021-11-18 MED ORDER — POLYETHYLENE GLYCOL 3350 17 G PO PACK: 17.0000 g | PACK | Freq: Every day | ORAL | 3 refills | Status: DC

## 2021-11-18 MED ORDER — TIZANIDINE HCL 4 MG PO TABS
4.0000 mg | ORAL_TABLET | Freq: Three times a day (TID) | ORAL | Status: DC | PRN
  Administered 2021-11-18 – 2021-11-19 (×2): 4 mg via ORAL
  Filled 2021-11-18 (×2): qty 1

## 2021-11-18 MED ORDER — PRIMIDONE 50 MG PO TABS: 100.0000 mg | ORAL_TABLET | Freq: Every day | ORAL | Status: DC

## 2021-11-18 MED ORDER — CHLORHEXIDINE GLUCONATE CLOTH 2 % EX PADS
6.0000 | MEDICATED_PAD | Freq: Every day | CUTANEOUS | Status: DC
  Administered 2021-11-19 – 2021-11-24 (×6): 6 via TOPICAL

## 2021-11-18 MED ORDER — FENTANYL 12 MCG/HR TD PT72: 1.0000 | MEDICATED_PATCH | TRANSDERMAL | 0 refills | Status: DC

## 2021-11-18 MED ORDER — SENNOSIDES-DOCUSATE SODIUM 8.6-50 MG PO TABS
1.0000 | ORAL_TABLET | Freq: Two times a day (BID) | ORAL | Status: DC
  Administered 2021-11-18 – 2021-12-04 (×21): 1 via ORAL
  Filled 2021-11-18 (×34): qty 1

## 2021-11-18 MED ORDER — PRIMIDONE 50 MG PO TABS
150.0000 mg | ORAL_TABLET | Freq: Two times a day (BID) | ORAL | Status: DC
  Administered 2021-11-18 – 2021-12-06 (×36): 150 mg via ORAL
  Filled 2021-11-18 (×36): qty 3

## 2021-11-18 NOTE — Discharge Summary (Signed)
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  Patient's pain progressively worsened and he was seen by orthopedics and was prescribed prednisone, muscle relaxants but his pain did not improve so he presented to the emergency department.  Patient described the pain as severe, radiating from low back to the right leg, worse with any movement.  He was found to be afebrile and hemodynamically stable on presentation.  MRI of the lumbar spine was concerning for discitis/osteomyelitis of L2-L3 without any discrete abscess.  Blood culture sent.  IR consulted for disc aspiration.  ID following, on IV antibiotics 6 weeks, PICC line placed.  PT/OT consulted, recommended inpatient rehab  Assessment and Plan:  Intractable back pain, discitis/osteomyelitis of L2-L3 -MRI on admission concerning for discitis/osteomyelitis L2-L3 without any abscesses.  Underwent aspiration by IR on 9/11, cultures without growth. -Patient was seen by ID, recommended 6 weeks of IV daptomycin, ceftriaxone, stop date 12/25/2021.  Patient has appointment with Dr. West Bali on 12/04/2021 -PICC line placed on 9/14 -Overall improving, currently on Dilaudid, fentanyl patch, Decadron, Zanaflex, Neurontin -Neurontin dose was increased to 400 mg 3  times daily today -Pain overall improving however still gets sharp shooting nervelike pains with movement. -Continue Decadron 4 mg daily for 2 days then off -PT recommended CIR, patient accepted to inpatient rehab   Anxiety --continue Zoloft, clonazepam   History of tremor --continue home regimen      Pain control - [name] was reviewed. and patient was instructed, not to drive, operate heavy machinery, perform activities at heights, swimming or participation in water activities or provide baby-sitting services while on Pain, Sleep and Anxiety Medications; until their outpatient Physician has advised to do so again. Also recommended to not to take more than prescribed Pain, Sleep and Anxiety Medications.  Consultants: Infectious disease, IR, CIR Procedures performed:  Disc aspirate 9/11-no growth Disposition: Inpatient rehab  Diet recommendation: Heart healthy diet  DISCHARGE MEDICATION: Allergies as of 11/18/2021   No Known Allergies      Medication List     STOP taking these medications    predniSONE 10 MG (21) Tbpk tablet Commonly known as: STERAPRED UNI-PAK 21 TAB   rosuvastatin 20 MG tablet Commonly known as: CRESTOR       TAKE these medications    cefTRIAXone  IVPB Commonly known as: ROCEPHIN Inject 2 g into the vein daily. Indication:  Osteomyelitis of the spine First Dose: Yes Last Day of Therapy:  12/25/21 Labs - Once weekly:  CBC/D and BMP, Labs - Every other week:  ESR and CRP Method of administration: IV Push Pull PICC line at the end of IV antibiotics  Method of administration may be changed at the discretion of home infusion pharmacist based upon assessment of the patient and/or caregiver's ability to  self-administer the medication ordered.   clonazePAM 0.5 MG tablet Commonly known as: KLONOPIN Take 0.25 mg by mouth at bedtime.   CO Q 10 PO Take 1 tablet by mouth daily.   daptomycin   IVPB Commonly known as: CUBICIN Inject 600 mg into the vein daily. Indication:  Osteomyelitis of the lumbar spine First Dose: Yes Last Day of Therapy:  12/25/21 Labs - Once weekly:  CBC/D, BMP, and CPK Labs - Every other week:  ESR and CRP Method of administration: IV Push Pull PICC line at the end of IV antibiotics  Method of administration may be changed at the discretion of home infusion pharmacist based upon assessment of the patient and/or caregiver's ability to self-administer the medication ordered.   fentaNYL 12 MCG/HR Commonly known as: [place] 1 patch onto the skin every 3 (three) days. Start taking on: November 19, 2021   HYDROmorphone 4 MG tablet Commonly known as: DILAUDID Take 1 tablet (4 mg total) by mouth every 4 (four) hours as needed for severe pain (and breakthrough pain).   Licart 1.3 % UY40 Generic drug: Diclofenac Epolamine Remove patch prior to placing new one.  Topical Use Only.   meclizine 25 MG tablet Commonly known as: ANTIVERT Take 25 mg by mouth 4 (four) times daily as needed for dizziness.   meloxicam 7.5 MG tablet Commonly known as: MOBIC Take 7.5 mg by mouth daily.   omeprazole 20 MG tablet Commonly known as: PRILOSEC OTC Take 20 mg by mouth daily.   pantoprazole 40 MG tablet Commonly known as: PROTONIX Take 1 tablet (40 mg total) by mouth daily. Start taking on: November 19, 2021   polyethylene glycol 17 g packet Commonly known as: MIRALAX / GLYCOLAX Take 17 g by mouth daily. Start taking on: November 19, 2021   primidone 50 MG tablet Commonly known as: MYSOLINE TAKE 3 TABLETS BY MOUTH EVERY MORNING, 2 TABLET IN THE AFTERNOON AND 3 IN THE EVENING What changed: See the new instructions.   senna-docusate 8.6-50 MG tablet Commonly known as: Senokot-S Take 1 tablet by mouth 2 (two) times daily.   sertraline 50 MG tablet Commonly known as: ZOLOFT Take 50 mg by mouth daily.   tiZANidine 4 MG tablet Commonly known as:  ZANAFLEX Take 1 tablet (4 mg total) by mouth every 6 (six) hours as needed for muscle spasms.   Vitamin D-3 125 MCG (5000 UT) Tabs Take 1 tablet by mouth daily.               Discharge Care Instructions  (From admission, onward)           Start     Ordered   11/18/21 0000  Change dressing on IV access line weekly and PRN  (Home infusion instructions - [facility] )        11/18/21 0945            Follow-up Information     Tisovec, Fransico Him, MD Follow up in 2 week(s).   Specialty: Internal Medicine Why: for hospital follow-up Contact information: [place] [address] [phone]         Rosiland Oz, MD Follow up on 12/04/2021.   Specialty: Infectious Diseases Why: at [facility] information: [address] [place] [address] [phone]                Discharge Exam: Danley Danker Weights   11/11/21 1402  Weight: 75.8 kg   S: Currently pain controlled,  usually comes in waves, sharp shooting pain radiating down to the leg worse with any movement.  Wife at the bedside.  No acute issues overnight.  Vitals:   11/17/21 0838 11/17/21 1416 11/17/21 2039 11/18/21 0526  BP: 127/67 116/69 129/70 (!) 142/70  Pulse: 63 63 62 65  Resp:  18 18 20   Temp:  (!) 97.5 F (36.4 C) 97.8 F (36.6 C) 98.1 F (36.7 C)  TempSrc:  Oral  Oral  SpO2: 93% 94% 90% 95%  Weight:      Height:         Physical Exam General: Alert and oriented x 3, NAD Cardiovascular: S1 S2 clear, RRR.  Respiratory: CTAB, no wheezing, rales or rhonchi Gastrointestinal: Soft, nontender, nondistended, NBS Ext: no pedal edema bilaterally Neuro: no new deficits Skin: No rashes Psych: Normal affect and demeanor, alert and oriented x3   Condition at discharge: fair  The results of significant diagnostics from this hospitalization (including imaging, microbiology, ancillary and laboratory) are listed below for reference.   Imaging  Studies: [place] EKG SITE RITE  Result Date: 11/15/2021 If Site Rite image not attached, placement could not be confirmed due to current cardiac rhythm.  IR LUMBAR DISC ASPIRATION W/IMG GUIDE  Result Date: 11/13/2021 INDICATION: 71 year old male with L2-L3 osteomyelitis discitis EXAM: Disc aspiration MEDICATIONS: None. ANESTHESIA/SEDATION: Fentanyl 100 mcg IV; Versed 2 mg IV Moderate Sedation Time:  15 minutes The patient's vital signs and level of consciousness were continuously monitored during the procedure by the interventional radiology nurse under my direct supervision. COMPLICATIONS: None immediate. PROCEDURE: Informed written consent was obtained from the patient after a thorough discussion of the procedural risks, benefits and alternatives. All questions were addressed. Maximal Sterile Barrier Technique was utilized including caps, mask, sterile gowns, sterile gloves, sterile drape, hand hygiene and skin antiseptic. A timeout was performed prior to the initiation of the procedure. A suitable skin entry site was selected and marked. The overlying skin was sterilely prepped and draped in the standard fashion using Betadine. A 22 gauge spinal needle was then carefully advanced into the intervertebral disc at L2-L3 under fluoroscopic guidance. Aspiration was performed yielding 1 cc of serosanguineous fluid. This was sent for Gram stain and culture. IMPRESSION: Successful L2-L3 disc aspiration yielding 1 mL serosanguineous fluid. Electronically Signed   By: Jacqulynn Cadet M.D.   On: 11/13/2021 12:41   MR Lumbar Spine W Wo Contrast  Result Date: 11/11/2021 CLINICAL DATA:  Lumbar radiculopathy. Infection suspected. Progressive symptoms over the last week. EXAM: MRI LUMBAR SPINE WITHOUT AND WITH CONTRAST TECHNIQUE: Multiplanar and multiecho pulse sequences of the lumbar spine were obtained without and with intravenous contrast. CONTRAST:  7.70m GADAVIST GADOBUTROL 1 MMOL/ML IV SOLN COMPARISON:  None  Available. FINDINGS: Segmentation: 5 non rib-bearing lumbar type vertebral bodies are present. The lowest fully formed vertebral body is L5. Alignment: Slight retrolisthesis at L2-3 is worse on the left. No other significant listhesis present. Straightening of the normal lumbar lordosis is present. Levoconvex curvature is present at L2-3. Vertebrae: Sclerotic changes are present anteriorly on the right at L2-3. Fluid is present in the L2-3 disc space. There is some enhancement within the disc space and mild endplate enhancement at L2-3. Subtle enhancement of right paravertebral soft tissues is present as well. Edematous changes are present on the left at L4-5 with some enhancement. No enhancement is present in the disc space. Conus medullaris and cauda equina: Conus extends to the T12-L1 level. Conus and cauda equina appear normal. Paraspinal and  other soft tissues: Limited imaging the abdomen is unremarkable. There is no significant adenopathy. No solid organ lesions are present. Disc levels: T12-L1: Negative. L1-2: A broad-based disc protrusion is present. Mild facet hypertrophy is noted. Mild foraminal stenosis is present bilaterally. L2-3: Rightward disc protrusion results in mild right subarticular stenosis. Moderate to severe right foraminal stenosis is present. Moderate left foraminal stenosis is present. L3-4: A broad-based disc protrusion is present. Moderate facet hypertrophy is present bilaterally. Central canal is patent. Moderate left and mild right foraminal narrowing is present. L4-5: A broad-based disc protrusion is present. Chronic loss of height is present on the left. Moderate to severe left subarticular and foraminal narrowing is present. Mild to moderate right subarticular and foraminal narrowing is present. L5-S1: A shallow central disc protrusion is present. No significant stenosis is present. IMPRESSION: 1. Fluid in the L2-3 disc space with some enhancement within the disc space and mild  endplate enhancement. This is concerning for early disc osteomyelitis. 2. Mild right paravertebral soft tissue enhancement is present as well consistent with inflammatory change. No discrete abscess is present. 3. While there is no definite enhancement in the disc space, there is some left-sided endplate marrow edema and enhancement at L4-5. This most likely represents degenerative change in the setting of left-sided disease. 4. Mild foraminal narrowing bilaterally at L1-2. 5. Moderate to severe right and moderate left foraminal stenosis at L2-3. 6. Moderate left and mild right foraminal stenosis at L3-4. 7. Moderate to severe left subarticular and foraminal stenosis at L4-5. 8. Mild to moderate right subarticular and foraminal narrowing at L4-5. 9. Shallow central disc protrusion at L5-S1 without significant stenosis. These results were called by telephone at the time of interpretation on 11/11/2021 at 8:34 pm to provider Jolayne Panther, PA, who verbally acknowledged these results. Electronically Signed   By: San Morelle M.D.   On: 11/11/2021 20:34    Microbiology: Results for orders placed or performed during the hospital encounter of 11/11/21  Culture, blood (Routine X 2) w Reflex to ID Panel     Status: None   Collection Time: 11/11/21 10:54 PM   Specimen: BLOOD LEFT WRIST  Result Value Ref Range Status   Specimen Description   Final    BLOOD LEFT WRIST Performed at [facility] [address], [place], [place] [address]    Special Requests   Final    BOTTLES DRAWN AEROBIC AND ANAEROBIC Blood Culture results may not be optimal due to an excessive volume of blood received in culture bottles Performed at [facility] [address], [place], [place] [address]    Culture   Final    NO GROWTH 5 DAYS Performed at [facility], [place] [address] [place], [place] [address]    Report Status 11/17/2021 FINAL  Final  Urine Culture     Status: Abnormal    Collection Time: 11/11/21 11:14 PM   Specimen: Urine, Clean Catch  Result Value Ref Range Status   Specimen Description   Final    URINE, CLEAN CATCH Performed at [facility], [place] [address], [place], [place] [address]    Special Requests   Final    NONE Performed at [facility], [place] [address], [place], [place] [address]    Culture MULTIPLE SPECIES PRESENT, SUGGEST RECOLLECTION (A)  Final   Report Status 11/13/2021 FINAL  Final  Culture, blood (Routine X 2) w Reflex to ID Panel     Status:  None   Collection Time: 11/12/21  5:44 AM   Specimen: BLOOD LEFT HAND  Result Value Ref Range Status   Specimen Description   Final    BLOOD LEFT HAND Performed at [facility] [address], [place], [place] [address]    Special Requests   Final    BOTTLES DRAWN AEROBIC AND ANAEROBIC Blood Culture adequate volume Performed at [facility] [address], [place], [place] [address]    Culture   Final    NO GROWTH 5 DAYS Performed at [facility], [place] [address], [place], [place] [address]    Report Status 11/17/2021 FINAL  Final  Aerobic/Anaerobic Culture w Gram Stain (surgical/deep wound)     Status: None (Preliminary result)   Collection Time: 11/13/21 11:53 AM   Specimen: Abscess  Result Value Ref Range Status   Specimen Description   Final    ABSCESS Performed at [facility] [address], [place], [place] [address]    Special Requests INTERVERTEBRAL DISC  Final   Gram Stain   Final    RARE WBC PRESENT,BOTH PMN AND MONONUCLEAR NO ORGANISMS SEEN    Culture   Final    NO GROWTH 4 DAYS NO ANAEROBES ISOLATED; CULTURE IN PROGRESS FOR 5 DAYS Performed at [facility], [place] [address], [place], [place] [address]    Report Status PENDING  Incomplete  Culture, fungus without smear     Status: None (Preliminary result)   Collection Time: 11/13/21 11:53 AM   Specimen:  Back; Other  Result Value Ref Range Status   Specimen Description   Final    BACK DISC L2 L3 Performed at [facility] [address] [place], [place] [address]    Special Requests   Final    NONE Performed at [facility], [place] [address] [place], [place] [address]    Culture   Final    NO FUNGUS ISOLATED AFTER 3 DAYS Performed at [facility], [place] [address] [place],  [address]    Report Status PENDING  Incomplete  MRSA Next Gen by PCR, Nasal     Status: None   Collection Time: 11/13/21  1:07 PM   Specimen: Nasal Mucosa; Nasal Swab  Result Value Ref Range Status   MRSA by PCR Next Gen NOT DETECTED NOT DETECTED Final    Comment: (NOTE) The GeneXpert MRSA Assay (FDA approved for NASAL specimens only), is one component of a comprehensive MRSA colonization surveillance program. It is not intended to diagnose MRSA infection nor to guide or monitor treatment for MRSA infections. Test performance is not FDA approved in patients less than 84 years old. Performed at [facility], [place] [address], [place],  [address]     Labs: CBC: Recent Labs  Lab 11/11/21 1540 11/12/21 0544 11/15/21 0506  WBC 10.3 10.3 6.7  NEUTROABS 8.8*  --   --   HGB 14.2 13.2 13.2  HCT 41.7 39.8 38.7*  MCV 94.8 97.5 97.2  PLT 213 195 329   Basic Metabolic Panel: Recent Labs  Lab 11/11/21 1540 11/12/21 0544 11/15/21 0506  NA 141 141 138  K 4.1 4.0 3.8  CL 108 111 102  CO2 26 26 30   GLUCOSE 117* 96 105*  BUN 10 13 12   CREATININE 0.99 0.98 0.85  CALCIUM 8.9 8.7* 8.6*   Liver Function Tests: No results for input(s): "AST", "ALT", "ALKPHOS", "BILITOT", "PROT", "ALBUMIN"  in the last 168 hours. CBG: No results for input(s): "GLUCAP" in the last 168 hours.  Discharge time spent: greater than 30 minutes.  Signed: Estill Cotta, MD [facility] 11/18/2021

## 2021-11-18 NOTE — Progress Notes (Signed)
Inpatient Rehabilitation Admission Medication Review by a Pharmacist  A complete drug regimen review was completed for this patient to identify any potential clinically significant medication issues.  High Risk Drug Classes Is patient taking? Indication by Medication  Antipsychotic No   Anticoagulant No   Antibiotic Yes, as an intravenous medication CTX + Daptomycin through 12/25/21 per ID for discitis/osteomyelitis of lumbar spine  Opioid Yes Fentanyl patch, Dilaudid PO - pain  Antiplatelet No   Hypoglycemics/insulin No   Vasoactive Medication No   Chemotherapy No   Other Yes Clonazepam, sertraline - anxiety Primidone - tremor Diclofenac patch, gabapentin, decadron - pain Robaxin - muscle relaxant Protonix - GERD Ondansetron - nausea/vomiting     Type of Medication Issue Identified Description of Issue Recommendation(s)  Drug Interaction(s) (clinically significant)     Duplicate Therapy     Allergy     No Medication Administration End Date     Incorrect Dose     Additional Drug Therapy Needed     Significant med changes from prior encounter (inform family/care partners about these prior to discharge).    Other  PTA meds currently held: rosuvastatin, meloxicam, prednisone taper Restart PTA meds when and if necessary during CIR admission or at time of discharge, if warranted     Clinically significant medication issues were identified that warrant physician communication and completion of prescribed/recommended actions by midnight of the next day:  No  Name of provider notified for urgent issues identified:   Provider Method of Notification:    Pharmacist comments:   Time spent performing this drug regimen review (minutes):  [place], [place] Clinical Pharmacist 11/18/2021 2:51 PM

## 2021-11-18 NOTE — H&P (Addendum)
Physical Medicine and Rehabilitation Admission H&P        Chief Complaint  Patient presents with   Back Pain  : HPI: Cameron [name] is a 71 year old right-handed male with history of testicular cancer status postresection/orchiectomy 2005 and radiation therapy/BPH/anxiety/essential tremor maintained on Mysoline, tobacco use.  Per chart review patient lives with spouse.  Independent prior to admission.  Presented to [facility] 11/11/2021 with severe back pain that he experienced after splitting and stacking wood that slowly progressed.  He initially did see the orthopedic clinic started on steroid and muscle relaxer.  Unfortunately pain continued to worsen to the point he could not ambulate with radiating pain to the right lower extremity.  MRI lumbar spine showed fluid in the L2-3 disc space with some enhancement within the disc space and mild endplate enhancement.  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  Moderate to severe right and moderate left foraminal stenosis at L2-3.  Moderate left and mild right foraminal stenosis L3-4 as well as moderate to severe left subarticular and foraminal stenosis L4-5 and narrowing L4-5.  Admission chemistries unremarkable, sedimentation rate of 1, urinalysis negative.  Blood cultures no growth to date.  Underwent L2-3 disc aspiration per interventional radiology.  Aspiration culture currently with no growth so far.  Infectious disease Dr.Manandhar and placed on intravenous daptomycin and ceftriaxone x6 weeks ending 12/25/2021.  He is completing a Decadron taper.  Therapy evaluations completed due to patient decreased functional mobility was admitted for a comprehensive rehab program   Review of Systems  Constitutional:  Negative for chills and fever.  HENT:  Negative for hearing loss.   Eyes:  Negative for blurred vision and double vision.  Respiratory:  Negative for cough and shortness of breath.   Cardiovascular:  Negative for chest pain,  palpitations and leg swelling.  Gastrointestinal:  Positive for constipation. Negative for heartburn, nausea and vomiting.       GERD  Genitourinary:  Positive for urgency. Negative for dysuria and hematuria.  Musculoskeletal:  Positive for back pain.  Skin:  Negative for rash.  Neurological:  Positive for sensory change and weakness.  Psychiatric/Behavioral:  The patient has insomnia.   All other systems reviewed and are negative.       Past Medical History:  Diagnosis Date   Arthritis     Benign essential tremor     BPH (benign prostatic hyperplasia)     Concussion 1968    no residual from   COPD (chronic obstructive pulmonary disease) (HCC)      no inhalers   COVID 03/04/2020    runny nose  at times  loss of taste and smell still present   GERD (gastroesophageal reflux disease)     History of hiatal hernia      small per dr Osborne Casco   History of testicular cancer 2005    right testicle removed and 15 radiation tx   Hx of gynecomastia      due to hcg shots for male infertility   Insomnia     Nocturia     Tobacco abuse     Varicocele     Wears glasses      for reading         Past Surgical History:  Procedure Laterality Date   APPENDECTOMY   1965    open   IR LUMBAR [place] W/IMG GUIDE   11/13/2021   MASTECTOMY        1 sx for each  breast enalrgement sx was 10 yrs apaprt   ORCHIECTOMY   2005    right   surgery for undescended testicle   age 27    right   THULIUM LASER TURP (TRANSURETHRAL RESECTION OF PROSTATE) N/A 06/10/2020    Procedure: THULIUM LASER TURP (TRANSURETHRAL RESECTION OF PROSTATE);  Surgeon: Festus Aloe, MD;  Location[facility];  Service: Urology;  Laterality: N/A;   varicocoele   36 or 1984         Family History  Problem Relation Age of Onset   Prostate cancer Father     Stroke Mother     Prostate cancer Brother     Hypertension Sister     High Cholesterol Sister     Hypertension Sister     Memory loss Sister       Social History:  reports that he has been smoking cigarettes. He has a 60.00 pack-year smoking history. His smokeless tobacco use includes chew. He reports that he does not currently use alcohol. He reports that he does not use drugs. Allergies: No Known Allergies       Medications Prior to Admission  Medication Sig Dispense Refill   Cholecalciferol (VITAMIN D-3) 125 MCG (5000 UT) TABS Take 1 tablet by mouth daily.       clonazePAM (KLONOPIN) 0.5 MG tablet Take 0.25 mg by mouth at bedtime.       Coenzyme Q10 (CO Q 10 PO) Take 1 tablet by mouth daily.       meclizine (ANTIVERT) 25 MG tablet Take 25 mg by mouth 4 (four) times daily as needed for dizziness.       meloxicam (MOBIC) 7.5 MG tablet Take 7.5 mg by mouth daily.   6   omeprazole (PRILOSEC OTC) 20 MG tablet Take 20 mg by mouth daily.       predniSONE (STERAPRED UNI-PAK 21 TAB) 10 MG (21) TBPK tablet Take 10-60 mg by mouth as directed. Take 6 tablets on Day 1 Take 5 tablets on Day 2 Take 4 tablets on Day 3 Take 3 tablets on Day 4 Take 2 tablets on Day 5 Take 1 tablet on Day 6       primidone (MYSOLINE) 50 MG tablet TAKE 3 TABLETS BY MOUTH EVERY MORNING, 2 TABLET IN THE AFTERNOON AND 3 IN THE EVENING (Patient taking differently: Take 100-150 mg by mouth as directed. TAKE 3 TABLETS BY MOUTH EVERY MORNING, 2 TABLET IN THE AFTERNOON AND 3 IN THE EVENING) 720 tablet 1   rosuvastatin (CRESTOR) 20 MG tablet TAKE 1 TABLET BY MOUTH EVERY DAY 90 tablet 3   sertraline (ZOLOFT) 50 MG tablet Take 50 mg by mouth daily.   6          Home: Home Living Family/patient expects to be discharged to:: Private residence Living Arrangements: Spouse/significant other Available Help at Discharge: Family, Available 24 hours/day Type of Home: House Home Access: Stairs to enter [employer] of Steps: in front 3 steps with rail on R; in back 5 steps with rails on both sides -can reach both but would have longer to walk Home Layout: Two level,  Able to live on main level with bedroom/bathroom Bathroom Shower/Tub: Tub/shower unit, Walk-in shower (walk in is upstairs) Biochemist, clinical: Standard Bathroom Accessibility: Yes Home Equipment: Grab bars - tub/shower   Functional History: Prior Function Prior Level of Function : Independent/Modified Independent, Driving Mobility Comments: Walks in community without difficulty ADLs Comments: Independent with all; was splitting wood prior to  admission   Functional Status:  Mobility: Bed Mobility Overal bed mobility: Needs Assistance Bed Mobility: Rolling, Sidelying to Sit, Sit to Sidelying Rolling: Min assist Sidelying to sit: Min assist Sit to sidelying: Mod assist General bed mobility comments: Educated on log roll technique and performed well to sitting with min A for R leg and trunk.  However, once standing pt so painful that he needed to just lay down - required mod A for legs Transfers Overall transfer level: Needs assistance Equipment used: Rolling walker (2 wheels) Transfers: Sit to/from Stand Sit to Stand: Mod assist, Min guard General transfer comment: Initial min guard to rise but when pt had spasm/shooting pain he extends back and buckles at knees requiring mod A.  Transitioned to standing quickly from sitting b/c unable to tolerate sitting R hip Ambulation/Gait General Gait Details: unable   ADL:   Cognition: Cognition Overall Cognitive Status: Within Functional Limits for tasks assessed Cognition Arousal/Alertness: Awake/alert Behavior During Therapy: WFL for tasks assessed/performed Overall Cognitive Status: Within Functional Limits for tasks assessed   Physical Exam: Blood pressure (!) 143/67, pulse 66, temperature 98.1 F (36.7 C), resp. rate 18, height '5\' 9"'$  (1.753 m), weight 75.8 kg, SpO2 95 %. Constitutional: No apparent distress. Appropriate appearance for age.  HENT: No JVD. Neck Supple. Trachea midline. Atraumatic, normocephalic. Eyes: PERRLA. EOMI.  Visual fields grossly intact.  Cardiovascular: RRR, no murmurs/rub/gallops. No Edema. Peripheral pulses 2+  Respiratory: CTAB. No rales, rhonchi, or wheezing. On RA.  Abdomen: + bowel sounds, normoactive. No distention or tenderness.  Skin: C/D/I. No apparent lesions. Back biopsy area healed.  MSK:      No apparent deformity.      Strength:                RUE: 5/5 SA, 5/5 EF, 5/5 EE, 5/5 WE, 5/5 FF, 5/5 FA                 LUE: 5/5 SA, 5/5 EF, 5/5 EE, 5/5 WE, 5/5 FF, 5/5 FA                 RLE: 3/5 HF, 3/5 KE, 5/5 DF, 1/5 EHL, 5/5 PF  - limited by pain                 LLE:  5/5 HF, 5/5 KE, 5/5 DF, 5/5 EHL, 5/5 PF   Neurologic exam:  Cognition: AAO to person, place, time and event.  Memory: Recalls 3/3 objects at 5 minutes. No apparent deficits  Insight: Good insight into current condition.  Mood: Pleasant affect, appropriate mood.  Sensation: Altered/sensitive to light touch over medial right knee, extending along inner right thigh. Otherwise intact Reflexes: 2+ in BL UE and LEs. Negative Hoffman's and babinski signs bilaterally.  CN: 2-12 grossly intact.  Coordination: No apparent tremors.  Spasticity: MAS 0 in all extremities. Full body spasms spontaneously and periodically on exam, causing extreme pain through back and RLE      Latest Reference Range & Units 11/15/21 51:76  BASIC METABOLIC PANEL  Rpt !  Sodium 135 - 145 mmol/L 138  Potassium 3.5 - 5.1 mmol/L 3.8  Chloride 98 - 111 mmol/L 102  CO2 22 - 32 mmol/L 30  Glucose 70 - 99 mg/dL 105 (H)  BUN 8 - 23 mg/dL 12  Creatinine 0.61 - 1.24 mg/dL 0.85  Calcium 8.9 - 10.3 mg/dL 8.6 (L)  Anion gap 5 - 15  6  GFR, Estimated >60 mL/min >60  WBC 4.0 -  10.5 K/uL 6.7  RBC 4.22 - 5.81 MIL/uL 3.98 (L)  Hemoglobin 13.0 - 17.0 g/dL 13.2  HCT 39.0 - 52.0 % 38.7 (L)  MCV 80.0 - 100.0 fL 97.2  MCH 26.0 - 34.0 pg 33.2  MCHC 30.0 - 36.0 g/dL 34.1  RDW 11.5 - 15.5 % 13.2  Platelets 150 - 400 K/uL 158  nRBC 0.0 - 0.2 % 0.0  !: Data is  abnormal (H): Data is abnormally high (L): Data is abnormally low Rpt: View report in Results Review for more information    Imaging Results (Last 48 hours)  [place] EKG SITE RITE   Result Date: 11/15/2021 If Site Rite image not attached, placement could not be confirmed due to current cardiac rhythm.         Blood pressure (!) 143/67, pulse 66, temperature 98.1 F (36.7 C), resp. rate 18, height '5\' 9"'$  (1.753 m), weight 75.8 kg, SpO2 95 %.   Medical Problem List and Plan: 1. 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  Status post L2-3 disc aspiration per interventional radiology.  Blood cultures no growth to date.  Decadron 4 mg every 12 hours x2 days then 4 mg x 2 days and stop.             -patient may shower             -ELOS/Goals: 10-14 days 2.  Antithrombotics: -DVT/anticoagulation:  Mechanical: Antiembolism stockings, thigh (TED hose) Bilateral lower extremities             -antiplatelet therapy: none  3. Pain Management: Fentanyl patch/Duragesic patch, Neurontin 300 mg 3 times daily, Dilaudid 4 mg every 4 hours as needed pain, Robaxin as needed              - Schedule robaxin 500 mg QHS for nighttime spasms -> changed to Zanaflex 4 mg per patient preference              - Increase gabapentin to 600 mg TID for RLE neuropathic pain; check BMP in AM              - Pt endorses best relief from steroids; may need slow taper  4. Mood/Behavior/Sleep: Klonopin 0.25 mg nightly, Zoloft 50 mg daily             -antipsychotic agents: N/A   5. Neuropsych/cognition: This patient is capable of making decisions on his own behalf. 6. Skin/Wound Care: Routine skin checks 7. Fluids/Electrolytes/Nutrition: Routine in and outs with follow-up chemistries 8.  ID/discitis/osteomyelitis.  Intravenous daptomycin and ceftriaxone through 12/25/2021 per infectious disease 9.  Tremors.  Mysoline as directed 10.  Tobacco abuse.  Provide counseling 11.  GERD.   Protonix 12.  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  Follow-up outpatient    Cathlyn Parsons, PA-C 11/17/2021   I have examined the patient independently and edited the note for HPI, ROS, exam, assessment, and plan as appropriate. I am in agreement with the above recommendations.   Gertie Gowda, DO 11/18/2021

## 2021-11-19 ENCOUNTER — Inpatient Hospital Stay (HOSPITAL_COMMUNITY): Payer: Medicare Other

## 2021-11-19 DIAGNOSIS — M4646 Discitis, unspecified, lumbar region: Secondary | ICD-10-CM | POA: Diagnosis not present

## 2021-11-19 LAB — CBC
HCT: 40.2 % (ref 39.0–52.0)
Hemoglobin: 13.4 g/dL (ref 13.0–17.0)
MCH: 32.1 pg (ref 26.0–34.0)
MCHC: 33.3 g/dL (ref 30.0–36.0)
MCV: 96.4 fL (ref 80.0–100.0)
Platelets: 192 10*3/uL (ref 150–400)
RBC: 4.17 MIL/uL — ABNORMAL LOW (ref 4.22–5.81)
RDW: 13.3 % (ref 11.5–15.5)
WBC: 6.9 10*3/uL (ref 4.0–10.5)
nRBC: 0 % (ref 0.0–0.2)

## 2021-11-19 LAB — BASIC METABOLIC PANEL
Anion gap: 10 (ref 5–15)
BUN: 12 mg/dL (ref 8–23)
CO2: 30 mmol/L (ref 22–32)
Calcium: 9 mg/dL (ref 8.9–10.3)
Chloride: 98 mmol/L (ref 98–111)
Creatinine, Ser: 0.9 mg/dL (ref 0.61–1.24)
GFR, Estimated: >60 mL/min (ref >60)
Glucose, Bld: 95 mg/dL (ref 70–99)
Potassium: 3.6 mmol/L (ref 3.5–5.1)
Sodium: 138 mmol/L (ref 135–145)

## 2021-11-19 LAB — SEDIMENTATION RATE: Sed Rate: 10 mm/hr (ref 0–16)

## 2021-11-19 LAB — C-REACTIVE PROTEIN: CRP: 0.6 mg/dL (ref <1.0)

## 2021-11-19 MED ORDER — FENTANYL 12 MCG/HR TD PT72
1.0000 | MEDICATED_PATCH | TRANSDERMAL | Status: DC
  Administered 2021-11-19: 1 via TRANSDERMAL
  Filled 2021-11-19: qty 1

## 2021-11-19 MED ORDER — DEXAMETHASONE 4 MG PO TABS
4.0000 mg | ORAL_TABLET | Freq: Once | ORAL | Status: AC
  Administered 2021-11-19: 4 mg via ORAL
  Filled 2021-11-19: qty 1

## 2021-11-19 MED ORDER — HYDROMORPHONE HCL 1 MG/ML IJ SOLN
1.0000 mg | Freq: Once | INTRAMUSCULAR | Status: AC
  Administered 2021-11-19: 1 mg via INTRAVENOUS
  Filled 2021-11-19: qty 1

## 2021-11-19 MED ORDER — BACLOFEN 10 MG PO TABS
10.0000 mg | ORAL_TABLET | Freq: Three times a day (TID) | ORAL | Status: DC
  Administered 2021-11-19: 10 mg via ORAL
  Filled 2021-11-19: qty 1

## 2021-11-19 MED ORDER — DIAZEPAM 2 MG PO TABS
2.0000 mg | ORAL_TABLET | Freq: Four times a day (QID) | ORAL | Status: DC | PRN
  Administered 2021-11-19 – 2021-11-28 (×11): 2 mg via ORAL
  Filled 2021-11-19 (×12): qty 1

## 2021-11-19 MED ORDER — TIZANIDINE HCL 4 MG PO TABS
4.0000 mg | ORAL_TABLET | Freq: Three times a day (TID) | ORAL | Status: DC
  Administered 2021-11-19 – 2021-11-21 (×6): 4 mg via ORAL
  Filled 2021-11-19 (×6): qty 1

## 2021-11-19 MED ORDER — GADOPICLENOL 0.5 MMOL/ML IV SOLN
7.0000 mL | Freq: Once | INTRAVENOUS | Status: AC | PRN
  Administered 2021-11-19: 7 mL via INTRAVENOUS

## 2021-11-19 MED ORDER — SODIUM CHLORIDE 0.9 % IV SOLN: 1.0000 mg/h | Freq: Once | INTRAVENOUS | Status: DC

## 2021-11-19 NOTE — Progress Notes (Signed)
PROGRESS NOTE   Subjective/Complaints:  Multiple calls from nursing early this AM for intractable back pain and spasms. Tried tizanidine 4 mg, dilaudid 4 mg, decadron (last day of taper, gave early), baclofen 10 mg, fentanyl patch replacement with 10/10 pain, bracing against bed from spasms, unable to participate with therapies.   Consult placed and discussed case with neurosurgery due to concern for worsening, intractable spasms and concern for possible unstable spine. Neurosurgery reviewed OLF MRI 9/9 and reassured that, along with minimal elevations in ESR/CRP on initial presentation, very little concern for Dx discitis. Recommended repeat MRI W/ W/out and labs today, for which patient got 1 mg IV dilaudid to allow for imaging. Results further reassuring; symptoms likely from L2-3 disc causing severe R neuroforaminal stenosis, rather than discitis or infectious spread. Communicated this to patient and wife, and reinforced no further IV pain medication and need to mobilize OOB as tolerated with therapies. Family understanding.   Patient notes spasms are typically worst around 3-4 am.   ROS: Denies fevers, chills, N/V, abdominal pain, constipation, diarrhea, SOB, cough, chest pain, new weakness or paraesthesias.    Objective:   MR Lumbar Spine W Wo Contrast  Result Date: 11/19/2021 CLINICAL DATA:  L2-L3 osteomyelitis discitis follow-up. EXAM: MRI LUMBAR SPINE WITHOUT AND WITH CONTRAST TECHNIQUE: Multiplanar and multiecho pulse sequences of the lumbar spine were obtained without and with intravenous contrast. CONTRAST:  7 mL view a intravenous contrast. COMPARISON:  MRI lumbar spine dated November 11, 2021. FINDINGS: Segmentation:  Standard. Alignment: Unchanged dextroscoliosis similar trace retrolisthesis at L1-L2 and L2-L3. Vertebrae: Degree of fluid within the L2-L3 disc space has significantly decreased. Mild surrounding endplate  enhancement is unchanged. No progressive findings. Similar degenerative endplate marrow edema at L4-L5. Conus medullaris and cauda equina: Conus extends to the L1 level. Conus and cauda equina appear normal. No abnormal intrathecal enhancement. Paraspinal and other soft tissues: Negative. No paravertebral inflammatory change. Disc levels: T12-L1:  Negative. L1-L2: Unchanged mild-to-moderate left eccentric disc bulging. No stenosis. L2-L3: Unchanged mild-to-moderate right eccentric disc bulging and endplate spurring with asymmetric right facet arthropathy and ligamentum flavum hypertrophy. Unchanged moderate spinal canal and right lateral recess stenosis. Unchanged moderate to severe right and mild left neuroforaminal stenosis. L3-L4: Unchanged mild disc bulging with superimposed right foraminal and far lateral disc protrusion displacing the exiting right L3 nerve root. Unchanged mild-to-moderate bilateral facet arthropathy. Unchanged mild bilateral neuroforaminal stenosis. No spinal canal stenosis. L4-L5: Unchanged moderate disc bulging with left far lateral disc osteophyte complex. Unchanged moderate left and mild right facet arthropathy. Unchanged moderate spinal canal and bilateral lateral recess stenosis. Unchanged moderate to severe left and mild right neuroforaminal stenosis. L5-S1: Unchanged small broad-based posterior disc protrusion. No stenosis. IMPRESSION: 1. Decreased fluid within the L2-L3 disc space with unchanged mild surrounding endplate irregularity/enhancement. No progressive findings. This remains indeterminate for osteomyelitis-discitis versus degenerative change. Correlate with disc aspiration cultures. 2. Unchanged multilevel lumbar spondylosis as described above. Unchanged moderate spinal canal, right lateral recess, and moderate to severe right neuroforaminal stenosis at L2-L3. 3. Unchanged right foraminal and far lateral disc protrusion at L3-L4 displacing the exiting right L3 nerve root. 4.  Unchanged moderate spinal canal and bilateral  lateral recess stenosis at L4-L5 with moderate to severe left neuroforaminal stenosis. Electronically Signed   By: Titus Dubin M.D.   On: 11/19/2021 13:11   Recent Labs    11/19/21 0333  WBC 6.9  HGB 13.4  HCT 40.2  PLT 192   Recent Labs    11/19/21 0333  NA 138  K 3.6  CL 98  CO2 30  GLUCOSE 95  BUN 12  CREATININE 0.90  CALCIUM 9.0    Intake/Output Summary (Last 24 hours) at 11/19/2021 2008 Last data filed at 11/19/2021 1700 Gross per 24 hour  Intake 1152.07 ml  Output 1220 ml  Net -67.93 ml        Physical Exam: Vital Signs Blood pressure (!) 130/54, pulse 65, temperature 97.9 F (36.6 C), resp. rate 18, height $RemoveBe'5\' 9"'JIkamKyXm$  (1.753 m), weight 72.6 kg, SpO2 92 %.  Constitutional: Distressed, bracing against bed, intermittent jerks form paraspinal muscle spasms HENT: No JVD. Neck Supple. Trachea midline. Atraumatic, normocephalic. Eyes: PERRLA. EOMI. Visual fields grossly intact.  Cardiovascular: RRR, no murmurs/rub/gallops. No Edema. Peripheral pulses 2+  Respiratory: CTAB. No rales, rhonchi, or wheezing. On RA.  Abdomen: + bowel sounds, normoactive. No distention or tenderness.  Skin: C/D/I. No apparent lesions. Back biopsy area healed.  MSK:      No apparent deformity.      Strength:                RUE: 5/5 SA, 5/5 EF, 5/5 EE, 5/5 WE, 5/5 FF, 5/5 FA                 LUE: 5/5 SA, 5/5 EF, 5/5 EE, 5/5 WE, 5/5 FF, 5/5 FA                 RLE: 2/5 HF, 3/5 KE, 4/5 DF, 1/5 EHL (congenital), 4/5 PF  - limited by pain                 LLE:  5/5 HF, 5/5 KE, 5/5 DF, 5/5 EHL, 5/5 PF    Neurologic exam:  Cognition: AAO to person, place, time and event.  Sensation: Altered/sensitive to light touch over medial right knee, wrapping to R hip and back following L3 dermatome.  Reflexes: Unable to elicit BL LE d/t gaurding. No babinski, no clonus.   Assessment/Plan: 1. Functional deficits which require 3+ hours per day of  interdisciplinary therapy in a comprehensive inpatient rehab setting. Physiatrist is providing close team supervision and 24 hour management of active medical problems listed below. Physiatrist and rehab team continue to assess barriers to discharge/monitor patient progress toward functional and medical goals  Care Tool:  Bathing    Body parts bathed by patient: Right arm, Left arm, Left upper leg, Chest, Abdomen, Front perineal area, Buttocks, Right upper leg, Face     Body parts n/a: Right lower leg, Left lower leg   Bathing assist Assist Level: Set up assist (bed level)     Upper Body Dressing/Undressing Upper body dressing   What is the patient wearing?: Hospital gown only    Upper body assist Assist Level: Set up assist Assistive Device Comment: bed level  Lower Body Dressing/Undressing Lower body dressing    Lower body dressing activity did not occur: N/A       Lower body assist       Toileting Toileting    Toileting assist Assist for toileting: Minimal Assistance - Patient > 75% (urinal at bed levle)     Transfers  Chair/bed transfer  Transfers assist  Chair/bed transfer activity did not occur: Safety/medical concerns (unable to perform due to radiating back pain)        Locomotion Ambulation   Ambulation assist   Ambulation activity did not occur: Safety/medical concerns (unable to perform due to radiating back pain)          Walk 10 feet activity   Assist  Walk 10 feet activity did not occur: Safety/medical concerns (unable to perform due to radiating back pain)        Walk 50 feet activity   Assist Walk 50 feet with 2 turns activity did not occur: Safety/medical concerns (unable to perform due to radiating back pain)         Walk 150 feet activity   Assist Walk 150 feet activity did not occur: Safety/medical concerns (unable to perform due to radiating back pain)         Walk 10 feet on uneven surface   activity   Assist Walk 10 feet on uneven surfaces activity did not occur: Safety/medical concerns (unable to perform due to radiating back pain)         Wheelchair     Assist Is the patient using a wheelchair?: No   Wheelchair activity did not occur: Safety/medical concerns (unable to perform due to radiating back pain)         Wheelchair 50 feet with 2 turns activity    Assist    Wheelchair 50 feet with 2 turns activity did not occur: Safety/medical concerns (unable to perform due to radiating back pain)       Wheelchair 150 feet activity     Assist  Wheelchair 150 feet activity did not occur: Safety/medical concerns (unable to perform due to radiating back pain)       Blood pressure (!) 130/54, pulse 65, temperature 97.9 F (36.6 C), resp. rate 18, height 5' 9"  (1.753 m), weight 72.6 kg, SpO2 92 %.  Medical Problem List and Plan: 1. 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  Status post L2-3 disc aspiration per interventional radiology.  Blood cultures no growth to date.  Decadron 4 mg every 12 hours x2 days then 4 mg x 2 days and stop.             -patient may shower             -ELOS/Goals: 10-14 days 2.  Antithrombotics: -DVT/anticoagulation:  Mechanical: Antiembolism stockings, thigh (TED hose) Bilateral lower extremities             -antiplatelet therapy: none   3. Pain Management: Fentanyl patch/Duragesic patch, Neurontin 300 mg 3 times daily, Dilaudid 4 mg every 4 hours as needed pain, Robaxin as needed              - Zanaflex 4 mg TID instead of robaxin per pt preference              - Increase gabapentin to 600 mg TID for RLE neuropathic pain ; BUN/Cr stable 9/17              - Last dose Decadron 9/17              - Tried baclofen 10 mg; Dced d/t no effect              - Valium 2 mg Q6H PRN added, given relative spasm-free intervals following QHS clonazepam - wean as tolerated              -  Dilaudid 1  mg IV given 1x for imaging; reinforced to patient no further IV. Note, some nursing concern to seeking behavior.              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 ; defer consult to primary team   4. Mood/Behavior/Sleep: Klonopin 0.25 mg nightly, Zoloft 50 mg daily             -antipsychotic agents: N/A             - DC klonopin (home med) given PRN valium above; pt agreeable   5. Neuropsych/cognition: This patient is capable of making decisions on his own behalf. 6. Skin/Wound Care: Routine skin checks 7. Fluids/Electrolytes/Nutrition: Routine in and outs with follow-up chemistries             - BMP stable 9/17 8.  ID/discitis/osteomyelitis.  Intravenous daptomycin and ceftriaxone through 12/25/2021 per infectious disease             - Disc cultures NGTD             - MRI lumbar spine 9/17 stable             - Weekly labs to ID, q2week ESR/CRP 9.  Tremors.  Mysoline as directed 10.  Tobacco abuse.  Provide counseling 11.  GERD.  Protonix 12.  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  Follow-up outpatient      LOS: 1 days A FACE TO FACE EVALUATION WAS [place] 11/19/2021, 8:08 PM

## 2021-11-19 NOTE — Progress Notes (Signed)
Physical Therapy Assessment and Plan  Patient Details  Name: Cameron [name] MRN: [id number] Date of Birth: Jul 31, 1950  PT Diagnosis: Difficulty walking, Low back pain, Muscle spasms, Muscle weakness, Osteoarthritis, and Pain in joint Rehab Potential: Good ELOS: 7-10 days   Today's Date: 11/19/2021 PT Individual Time: 1000-1053 PT Individual Time Calculation (min): 17 min    Hospital Problem: Principal Problem:   Discitis   Past Medical History:  Past Medical History:  Diagnosis Date   Arthritis    Benign essential tremor    BPH (benign prostatic hyperplasia)    Concussion 1968   no residual from   COPD (chronic obstructive pulmonary disease) ([place])    no inhalers   COVID 03/04/2020   runny nose  at times  loss of taste and smell still present   GERD (gastroesophageal reflux disease)    History of hiatal hernia    small per dr Osborne Casco   History of testicular cancer 2005   right testicle removed and 15 radiation tx   Hx of gynecomastia    due to hcg shots for male infertility   Insomnia    Nocturia    Tobacco abuse    Varicocele    Wears glasses    for reading   Past Surgical History:  Past Surgical History:  Procedure Laterality Date   APPENDECTOMY  1965   open   IR LUMBAR [place] W/IMG GUIDE  11/13/2021   MASTECTOMY     1 sx for each breast enalrgement sx was 10 yrs apaprt   ORCHIECTOMY  2005   right   surgery for undescended testicle  age 34   right   THULIUM LASER TURP (TRANSURETHRAL RESECTION OF PROSTATE) N/A 06/10/2020   Procedure: THULIUM LASER TURP (TRANSURETHRAL RESECTION OF PROSTATE);  Surgeon: Festus Aloe, MD;  Location: [facility];  Service: Urology;  Laterality: N/A;   var[place]oc[place]  1983 or 1984    Assessment & Plan Clinical Impression: Patient is a 71 y.o. year old male with history of testicular cancer status postresection/orchiectomy 2005 and radiation therapy/BPH/anxiety/essential tremor maintained on [place],  tobacco use.  Per chart review patient lives with spouse.  Independent prior to admission.  Presented to [facility] 11/11/2021 with severe back pain that he experienced after splitting and stacking wood that slowly progressed.  He initially did see the orthopedic clinic started on steroid and muscle relaxer.  Unfortunately pain continued to worsen to the point he could not ambulate with radiating pain to the right lower extremity.  MRI lumbar spine showed fluid in the L2-3 disc space with some enhancement within the disc space and mild endplate enhancement.  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  Moderate to severe right and moderate left foraminal stenosis at L2-3.  Moderate left and mild right foraminal stenosis L3-4 as well as moderate to severe left subarticular and foraminal stenosis L4-5 and narrowing L4-5.  Admission chemistries unremarkable, sedimentation rate of 1, urinalysis negative.  Blood cultures no growth to date.  Underwent L2-3 disc aspiration per interventional radiology.  Aspiration culture currently with no growth so far.  Infectious disease Dr.Manandhar and placed on intravenous daptomycin and ceftriaxone x6 weeks ending 12/25/2021.  He is completing a Decadron taper.  Therapy evaluations completed due to patient decreased functional mobility was admitted for a comprehensive rehab programPatient transferred to CIR on 11/18/2021 .   Patient currently requires supervision with mobility secondary to muscle weakness and muscle joint tightness, decreased cardiorespiratoy endurance, and decreased sitting balance, decreased  standing balance, decreased postural control, and decreased balance strategies.  Prior to hospitalization, patient was  independent   with mobility and lived with Spouse in a House home.  Home access is in front 3 steps with rail on R; in back 5 steps with rails on both sides -can reach both but would have longer to walkStairs to enter.  Patient will benefit from  skilled PT intervention to maximize safe functional mobility, minimize fall risk, and decrease caregiver burden for planned discharge home with 24 hour supervision.  Anticipate patient will benefit from follow up OP at discharge.  PT - End of Session Activity Tolerance: Decreased this session Endurance Deficit: Yes Endurance Deficit Description: unable to tolerate sitting edge of bed > 1 min due to pain PT Assessment Rehab Potential (ACUTE/IP ONLY): Good PT Barriers to Discharge: Home environment access/layout PT Barriers to Discharge Comments: house, spouse 24/7, 2 level home with front entrance 3 steps to enter with R rails and back entrance across grass  4 steps to enter with bilateral rails PT Patient demonstrates impairments in the following area(s): Pain;Balance;Endurance;Sensory;Motor PT Transfers Functional Problem(s): Bed Mobility;Bed to Chair;Car PT Locomotion Functional Problem(s): Ambulation PT Plan PT Intensity: Minimum of 1-2 x/day ,45 to 90 minutes PT Frequency: 5 out of 7 days PT Duration Estimated Length of Stay: 7-10 days PT Treatment/Interventions: Ambulation/gait training;Discharge planning;DME/adaptive equipment instruction;Functional mobility training;Pain management;Psychosocial support;Therapeutic Activities;UE/LE Strength taining/ROM;Balance/vestibular training;Community reintegration;Disease management/prevention;Neuromuscular re-education;Patient/family education;Stair training;Therapeutic Exercise;UE/LE Coordination activities PT Transfers Anticipated Outcome(s): Mod I PT Locomotion Anticipated Outcome(s): Mod I PT Recommendation Recommendations for Other Services: Neuropsych consult (significantly limited with all mobility due to pain) Follow Up Recommendations: Outpatient PT Patient destination: Home Equipment Recommended: To be determined Equipment Details: owns no DME   PT Evaluation Precautions/Restrictions Precautions Precautions: Fall;Back Precaution  Comments: Back precautions for pain control (no formal order) Restrictions Weight Bearing Restrictions: No Pain Interference Pain Interference Pain Effect on Sleep: 4. Almost constantly Pain Interference with Therapy Activities: 4. Almost constantly Pain Interference with Day-to-Day Activities: 4. Almost constantly Home Living/Prior Functioning Home Living Available Help at Discharge: Family;Available 24 hours/day Type of Home: House Home Access: Stairs to enter [employer] of Steps: in front 3 steps with rail on R; in back 5 steps with rails on both sides -can reach both but would have longer to walk Home Layout: Two level;Able to live on main level with bedroom/bathroom Alternate Level Stairs-Number of Steps: flight of stairs Bathroom Shower/Tub: Tub/shower unit;Walk-in shower Bathroom Toilet: Standard Bathroom Accessibility: Yes  Lives With: Spouse Prior Function Level of Independence: Independent with gait;Independent with homemaking with ambulation;Independent with basic ADLs;Independent with transfers  Able to Take Stairs?: Yes Driving: Yes Leisure: Hobbies-yes (Comment) (golf, nascar, yardwork) Vision/Perception  Vision - History Ability to See in Adequate Light: 0 Adequate Perception Perception: Within Functional Limits Praxis Praxis: Intact  Cognition Overall Cognitive Status: Within Functional Limits for tasks assessed Arousal/Alertness: Awake/alert Memory: Appears intact Awareness: Appears intact Problem Solving: Appears intact Safety/Judgment: Appears intact Sensation Sensation Light Touch: Appears Intact Proprioception: Appears Intact Additional Comments: reports right knee numbness Coordination Gross Motor Movements are Fluid and Coordinated: No Fine Motor Movements are Fluid and Coordinated: No Coordination and Movement Description: grossly impaired due to pain Finger Nose Finger Test: BUE intention termor present Heel Shin Test: unable to  perform and assume position due to pain Motor  Motor Motor: Other (comment) (grossly limited due to pain) Motor - Skilled Clinical Observations: significantly limited due to pain - unable to tolerate EOB  Trunk/Postural Assessment  Cervical Assessment Cervical Assessment: Within Functional Limits Thoracic Assessment Thoracic Assessment: Within Functional Limits Lumbar Assessment Lumbar Assessment: Exceptions to [facility] (posterior pelvic tilt) Postural Control Postural Control: Deficits on evaluation Trunk Control: posteriror weight shift present sitting edge of bed to reduce pain  Balance Balance Balance Assessed: Yes Static Sitting Balance Static Sitting - Balance Support: Feet supported Static Sitting - Level of Assistance: 4: Min assist Dynamic Sitting Balance Dynamic Sitting - Balance Support: Feet supported;During functional activity Dynamic Sitting - Level of Assistance: 4: Min assist Dynamic Sitting - Balance Activities: Other (comment) (LE nerve glides) Sitting balance - Comments: Requiring bil UE to lift body and keep weight off R side, for pain control not balance; pt was unable to tolerate sitting -tried leaning L but still painful Extremity Assessment  RUE Assessment RUE Assessment: Within Functional Limits LUE Assessment LUE Assessment: Within Functional Limits RLE Assessment RLE Assessment: Not tested General Strength Comments: unable to formally test MMT due to significant pain LLE Assessment LLE Assessment: Not tested General Strength Comments: unable to formally test MMT due to significant pain  Care Tool Care Tool Bed Mobility Roll left and right activity   Roll left and right assist level: Supervision/Verbal cueing    Sit to lying activity   Sit to lying assist level: Supervision/Verbal cueing    Lying to sitting on side of bed activity   Lying to sitting on side of bed assist level: the ability to move from lying on the back to sitting on the side of  the bed with no back support.: Supervision/Verbal cueing     Care Tool Transfers Sit to stand transfer Sit to stand activity did not occur: Safety/medical concerns (unable to perform due to radiating back pain) Sit to stand assist level: Moderate Assistance - Patient 50 - 74%    Chair/bed transfer Chair/bed transfer activity did not occur: Safety/medical concerns (unable to perform due to radiating back pain)       Toilet transfer Toilet transfer activity did not occur: Safety/medical concerns (unable to perform due to radiating back pain)      Car transfer Car transfer activity did not occur: Safety/medical concerns (unable to perform due to radiating back pain)        Care Tool Locomotion Ambulation Ambulation activity did not occur: Safety/medical concerns (unable to perform due to radiating back pain)        Walk 10 feet activity Walk 10 feet activity did not occur: Safety/medical concerns (unable to perform due to radiating back pain)       Walk 50 feet with 2 turns activity Walk 50 feet with 2 turns activity did not occur: Safety/medical concerns (unable to perform due to radiating back pain)      Walk 150 feet activity Walk 150 feet activity did not occur: Safety/medical concerns (unable to perform due to radiating back pain)      Walk 10 feet on uneven surfaces activity Walk 10 feet on uneven surfaces activity did not occur: Safety/medical concerns (unable to perform due to radiating back pain)      Stairs Stair activity did not occur: Safety/medical concerns (unable to perform due to radiating back pain)        Walk up/down 1 step activity Walk up/down 1 step or curb (drop down) activity did not occur: Safety/medical concerns (unable to perform due to radiating back pain)      Walk up/down 4 steps activity Walk up/down 4 steps activity did not occur: Safety/medical  concerns (unable to perform due to radiating back pain)      Walk up/down 12 steps activity Walk  up/down 12 steps activity did not occur: Safety/medical concerns (unable to perform due to radiating back pain)      Pick up small objects from floor Pick up small object from the floor (from standing position) activity did not occur: Safety/medical concerns (unable to perform due to radiating back pain)      Wheelchair Is the patient using a wheelchair?: No   Wheelchair activity did not occur: Safety/medical concerns (unable to perform due to radiating back pain)      Wheel 50 feet with 2 turns activity Wheelchair 50 feet with 2 turns activity did not occur: Safety/medical concerns (unable to perform due to radiating back pain)    Wheel 150 feet activity Wheelchair 150 feet activity did not occur: Safety/medical concerns (unable to perform due to radiating back pain)      Refer to Care Plan for Long Term Goals  SHORT TERM GOAL WEEK 1 PT Short Term Goal 1 (Week 1): STG=LTG due to ELOS  Recommendations for other services: Neuropsych  Skilled Therapeutic Intervention Treatment Session :  Pt received semi-reclined in bed and agreeable to PT evaluation. Pt reports 10/10 low back and SIJ pain that radiates to right LE. PT obtained subjective history and assessed pain interference, sensation, coordination, and strength supine in bed. Pt requires (S) with transition to prone and PT provided soft tissue mobilization for therapeutic relief to right posterior back extensors as pt with presents with multiple trigger points. Pt reports mild improvement in pain with return to supine following manual therapy. Pt required (S) with supine to sit and PT educated and instructed pt on LE nerve glides for pain relief and pt unable to tolerate position for >1 min and returned sit to lying (S). Pt significantly limited due to radiating back pain and spasms and bed level evaluation performed as pt unable to participate in further mobility. Pt left semi-reclined in bed with alarm on and all needs in reach. MD  updated regarding status of session. PT educated patient regarding CIR policies/procedures and updated safety plan.   Mobility Bed Mobility Bed Mobility: Rolling Left;Supine to Sit;Rolling Right Rolling Right: Supervision/verbal cueing Rolling Left: Supervision/Verbal cueing Supine to Sit: Supervision/Verbal cueing Transfers Transfers: Sit to Stand;Stand to Sit Sit to Stand: Moderate Assistance - Patient 50-74% Stand to Sit: Moderate Assistance - Patient 50-74% Locomotion  Gait Ambulation: No Gait Gait: No Stairs / Additional Locomotion Stairs: No Wheelchair Mobility Wheelchair Mobility: No   Discharge Criteria: Patient will be discharged from PT if patient refuses treatment 3 consecutive times without medical reason, if treatment goals not met, if there is a change in medical status, if patient makes no progress towards goals or if patient is discharged from hospital.  The above assessment, treatment plan, treatment alternatives and goals were discussed and mutually agreed upon: by patient  Tanja Port PT, DPT  11/19/2021, 12:19 PM

## 2021-11-19 NOTE — Plan of Care (Signed)
  Problem: RH Balance Goal: LTG: Patient will maintain dynamic sitting balance (OT) Description: LTG:  Patient will maintain dynamic sitting balance with assistance during activities of daily living (OT) Flowsheets (Taken 11/19/2021 1158) LTG: Pt will maintain dynamic sitting balance during ADLs with: Independent with assistive device Goal: LTG Patient will maintain dynamic standing with ADLs (OT) Description: LTG:  Patient will maintain dynamic standing balance with assist during activities of daily living (OT)  Flowsheets (Taken 11/19/2021 1158) LTG: Pt will maintain dynamic standing balance during ADLs with: Independent with assistive device   Problem: Sit to Stand Goal: LTG:  Patient will perform sit to stand in prep for activites of daily living with assistance level (OT) Description: LTG:  Patient will perform sit to stand in prep for activites of daily living with assistance level (OT) Flowsheets (Taken 11/19/2021 1158) LTG: PT will perform sit to stand in prep for activites of daily living with assistance level: Independent with assistive device   Problem: RH Bathing Goal: LTG Patient will bathe all body parts with assist levels (OT) Description: LTG: Patient will bathe all body parts with assist levels (OT) Flowsheets (Taken 11/19/2021 1158) LTG: Pt will perform bathing with assistance level/cueing: Independent with assistive device    Problem: RH Dressing Goal: LTG Patient will perform upper body dressing (OT) Description: LTG Patient will perform upper body dressing with assist, with/without cues (OT). Flowsheets (Taken 11/19/2021 1158) LTG: Pt will perform upper body dressing with assistance level of: Independent with assistive device Goal: LTG Patient will perform lower body dressing w/assist (OT) Description: LTG: Patient will perform lower body dressing with assist, with/without cues in positioning using equipment (OT) Flowsheets (Taken 11/19/2021 1158) LTG: Pt will perform  lower body dressing with assistance level of: Independent with assistive device   Problem: RH Toileting Goal: LTG Patient will perform toileting task (3/3 steps) with assistance level (OT) Description: LTG: Patient will perform toileting task (3/3 steps) with assistance level (OT)  Flowsheets (Taken 11/19/2021 1158) LTG: Pt will perform toileting task (3/3 steps) with assistance level: Independent with assistive device   Problem: RH Toilet Transfers Goal: LTG Patient will perform toilet transfers w/assist (OT) Description: LTG: Patient will perform toilet transfers with assist, with/without cues using equipment (OT) Flowsheets (Taken 11/19/2021 1158) LTG: Pt will perform toilet transfers with assistance level of: Independent with assistive device   Problem: RH Tub/Shower Transfers Goal: LTG Patient will perform tub/shower transfers w/assist (OT) Description: LTG: Patient will perform tub/shower transfers with assist, with/without cues using equipment (OT) Flowsheets (Taken 11/19/2021 1158) LTG: Pt will perform tub/shower stall transfers with assistance level of: Independent with assistive device

## 2021-11-19 NOTE — Discharge Instructions (Addendum)
Inpatient Rehab Discharge Instructions  Cameron [name] Discharge date and time: No discharge date for patient encounter.   Activities/Precautions/ Functional Status: Activity: As tolerated Diet: Regular Wound Care: Routine skin checks Functional status:  ___ No restrictions     ___ Walk up steps independently ___ 24/7 supervision/assistance   ___ Walk up steps with assistance ___ Intermittent supervision/assistance  ___ Bathe/dress independently ___ Walk with walker     _x__ Bathe/dress with assistance ___ Walk Independently    ___ Shower independently ___ Walk with assistance    ___ Shower with assistance ___ No alcohol     ___ Return to work/school ________  COMMUNITY REFERRALS UPON DISCHARGE:    Home Health:   PT     OT                     [place] *Please expect follow-up within 2-3 days to schedule your home visit. If you have not received follow-up, be sure to contact the branch directly.*    Medical Equipment/Items Ordered: rolling walker and 3in1 bedside commode                                                 Agency/Supplier: [employer] [phone]      Special Instructions: No driving smoking or alcohol   Continue Zyvox 600 mg every 12 hours through 12/08/2021 and stop   My questions have been answered and I understand these instructions. I will adhere to these goals and the provided educational materials after my discharge from the hospital.  Patient/Caregiver Signature _______________________________ Date __________  Clinician Signature _______________________________________ Date __________  Please bring this form and your medication list with you to all your follow-up doctor's appointments.

## 2021-11-19 NOTE — Evaluation (Signed)
Occupational Therapy Assessment and Plan  Patient Details  Name: Cameron [name] MRN: [id number] Date of Birth: Sep 06, 1950  OT Diagnosis: acute pain Rehab Potential: Rehab Potential (ACUTE ONLY): Good ELOS: 5 days if he was not in severe pain   Today's Date: 11/19/2021 OT Individual Time: 0947-0962 OT Individual Time Calculation (min): 55 min     Hospital Problem: Principal Problem:   Discitis   Past Medical History:  Past Medical History:  Diagnosis Date   Arthritis    Benign essential tremor    BPH (benign prostatic hyperplasia)    Concussion 1968   no residual from   COPD (chronic obstructive pulmonary disease) ([place])    no inhalers   COVID 03/04/2020   runny nose  at times  loss of taste and smell still present   GERD (gastroesophageal reflux disease)    History of hiatal hernia    small per dr Osborne Casco   History of testicular cancer 2005   right testicle removed and 15 radiation tx   Hx of gynecomastia    due to hcg shots for male infertility   Insomnia    Nocturia    Tobacco abuse    Varicocele    Wears glasses    for reading   Past Surgical History:  Past Surgical History:  Procedure Laterality Date   APPENDECTOMY  1965   open   IR LUMBAR [place] W/IMG GUIDE  11/13/2021   MASTECTOMY     1 sx for each breast enalrgement sx was 10 yrs apaprt   ORCHIECTOMY  2005   right   surgery for undescended testicle  age 42   right   THULIUM LASER TURP (TRANSURETHRAL RESECTION OF PROSTATE) N/A 06/10/2020   Procedure: THULIUM LASER TURP (TRANSURETHRAL RESECTION OF PROSTATE);  Surgeon: Festus Aloe, MD;  Location: [facility];  Service: Urology;  Laterality: N/A;   varicocoele  1983 or 1984    Assessment & Plan Clinical Impression: Patient is a 71 y.o. year old male right-handed male with history of testicular cancer status postresection/orchiectomy 2005 and radiation therapy/BPH/anxiety/essential tremor maintained on Mysoline, tobacco use.   Per chart review patient lives with spouse.  Independent prior to admission.  [facility]ed to [facility] 11[facility]11/2021 with severe back pain that he experienced after splitting and stacking wood that slowly progressed.  He initially did see the orthopedic clinic started on steroid and muscle relaxer.  Unfortunately pain continued to worsen to the point he could not ambulate with radiating pain to the right lower extremity.  MRI lumbar spine showed fluid in the L2-3 disc space with some enhancement within the disc space and mild endplate enhancement.  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  Moderate to severe right and moderate left foraminal stenosis at L2-3.  Moderate left and mild right foraminal stenosis L3-4 as well as moderate to severe left subarticular and foraminal stenosis L4-5 and narrowing L4-5.  Admission chemistries unremarkable, sedimentation rate of 1, urinalysis negative.  Blood cultures no growth to date.  Underwent L2-3 disc aspiration per interventional radiology.  Aspiration culture currently with no growth so far.  Infectious disease Dr.Manandhar and placed on intravenous daptomycin and ceftriaxone x6 weeks ending 12/25/2021.  He is completing a Decadron taper.   Patient transferred to CIR on 11/18/2021 .    Patient currently requires min with basic self-care skills at bed level and unable to tolerate sititng EOB or standing for any length of time and unable to tolerate a chair or sitting position  in bed and  secondary to muscle weakness and acute pain and decreased sitting balance and decreased standing balance.  Prior to hospitalization, patient could complete ADL with independent .  Patient will benefit from skilled intervention to decrease level of assist with basic self-care skills prior to discharge home with care partner.  Anticipate patient will require intermittent supervision and follow up outpatient.  OT - End of Session Activity Tolerance: Tolerates < 10 min activity with  changes in vital signs Endurance Deficit: Yes Endurance Deficit Description: unable to perform ADLs outside of bed due to pain OT Assessment Rehab Potential (ACUTE ONLY): Good OT Barriers to Discharge: Other (comments) OT Barriers to Discharge Comments: pain OT Patient demonstrates impairments in the following area(s): Balance;Pain;Endurance OT Basic ADL's Functional Problem(s): Bathing;Dressing;Toileting OT Transfers Functional Problem(s): Toilet;Tub/Shower OT Additional Impairment(s): None OT Plan OT Intensity: Minimum of 1-2 x/day, 45 to 90 minutes OT Frequency: 5 out of 7 days OT Duration/Estimated Length of Stay: 5 days if he was not in severe pain OT Treatment/Interventions: Balance/vestibular training;Discharge planning;Pain management;Self Care/advanced ADL retraining;Therapeutic Activities;Functional mobility training;Patient/family education;Community reintegration;DME/adaptive equipment instruction;Psychosocial support;UE/LE Strength taining/ROM OT Self Feeding Anticipated Outcome(s): n/a OT Basic Self-Care Anticipated Outcome(s): mod I OT Toileting Anticipated Outcome(s): mod I OT Bathroom Transfers Anticipated Outcome(s): mod I OT Recommendation Recommendations for Other Services: Neuropsych consult Patient destination: Home Follow Up Recommendations: Outpatient OT Equipment Recommended: To be determined   OT Evaluation Precautions/Restrictions  Precautions Precautions: Fall;Back Restrictions Weight Bearing Restrictions: No General Chart Reviewed: Yes PT Missed Treatment Reason: Pain Family/Caregiver Present: No Vital Signs  Pain Pain Assessment Faces Pain Scale: Hurts worst Pain Type: Acute pain Pain Location: Hip (and back) Pain Descriptors / Indicators: Stabbing Pain Onset: On-going Pain Intervention(s): Repositioned;Elevated extremity;RN made aware;Therapeutic touch;Medication (See eMAR) Home Living/Prior Functioning Home Living Living Arrangements:  Spouse/significant other Available Help at Discharge: Family, Available 24 hours/day Type of Home: House Home Access: Stairs to enter [employer] of Steps: in front 3 steps with rail on R; in back 5 steps with rails on both sides -can reach both but would have longer to walk Home Layout: Two level, Able to live on main level with bedroom/bathroom Bathroom Shower/Tub: Tub/shower unit, Walk-in shower  Lives With: Spouse Vision Baseline Vision/History: 0 No visual deficits Ability to See in Adequate Light: 0 Adequate Patient Visual Report: No change from baseline Vision Assessment?: No apparent visual deficits Perception  Perception: Within Functional Limits Praxis Praxis: Intact Cognition Cognition Overall Cognitive Status: Within Functional Limits for tasks assessed Arousal/Alertness: Awake/alert Orientation Level: Person;Place;Situation Person: Oriented Place: Oriented Situation: Oriented Memory: Appears intact Awareness: Appears intact Problem Solving: Appears intact Safety/Judgment: Appears intact Brief Interview for Mental Status (BIMS) Repetition of Three Words (First Attempt): 3 Temporal Orientation: Year: Correct Temporal Orientation: Month: Accurate within 5 days Temporal Orientation: Day: Correct Recall: "Sock": Yes, no cue required Recall: "Blue": Yes, no cue required Recall: "Bed": Yes, no cue required BIMS Summary Score: 15 Sensation Sensation Light Touch: Appears Intact Proprioception: Appears Intact Coordination Gross Motor Movements are Fluid and Coordinated: No Fine Motor Movements are Fluid and Coordinated: Yes Coordination and Movement Description: significantly limited due to pain Motor  Motor Motor: Within Functional Limits Motor - Skilled Clinical Observations: significantly limited due to pain - unable to tolerate EOB or standing  Trunk/Postural Assessment  Cervical Assessment Cervical Assessment: Within Functional Limits Thoracic  Assessment Thoracic Assessment:  (leans posterior due to pain - but structurally WFL) Postural Control Postural Control: Deficits on evaluation Trunk Control: has  to lean back due to pain  Balance Dynamic Sitting Balance Sitting balance - Comments: Requiring bil UE to lift body and keep weight off R side, for pain control not balance; pt was unable to tolerate sitting -tried leaning L but still painful Extremity/Trunk Assessment RUE Assessment RUE Assessment: Within Functional Limits LUE Assessment LUE Assessment: Within Functional Limits  Care Tool Care Tool Self Care Eating Eating activity did not occur:  (declined cause in so much pain- can eat with setup)      Oral Care    Oral Care Assist Level: Set up assist    Bathing   Body parts bathed by patient: Right arm;Left arm;Left upper leg;Chest;Abdomen;Front perineal area;Buttocks;Right upper leg;Face   Body parts n/a: Right lower leg;Left lower leg Assist Level: Set up assist (bed level)    Upper Body Dressing(including orthotics)   What is the patient wearing?: Hospital gown only   Assist Level: Set up assist Assistive Device Comment: bed level  Lower Body Dressing (excluding footwear) Lower body dressing activity did not occur: N/A        Putting on/Taking off footwear   What is the patient wearing?: Non-skid slipper socks;Ted hose Assist for footwear: Dependent - Patient 0%       Care Tool Toileting Toileting activity   Assist for toileting: Minimal Assistance - Patient > 75% (urinal at bed levle)     Care Tool Bed Mobility Roll left and right activity   Roll left and right assist level: Independent    Sit to lying activity   Sit to lying assist level: Minimal Assistance - Patient > 75%    Lying to sitting on side of bed activity   Lying to sitting on side of bed assist level: the ability to move from lying on the back to sitting on the side of the bed with no back support.: Moderate Assistance - Patient 50  - 74%     Care Tool Transfers Sit to stand transfer   Sit to stand assist level: Moderate Assistance - Patient 50 - 74%    Chair/bed transfer Chair/bed transfer activity did not occur: Safety/medical concerns (unable due to significant pain)       Toilet transfer Toilet transfer activity did not occur: Safety/medical concerns (unable due to significant pain)       Care Tool Cognition  Expression of Ideas and Wants Expression of Ideas and Wants: 4. Without difficulty (complex and basic) - expresses complex messages without difficulty and with speech that is clear and easy to understand  Understanding Verbal and Non-Verbal Content Understanding Verbal and Non-Verbal Content: 4. Understands (complex and basic) - clear comprehension without cues or repetitions   Memory/Recall Ability     Refer to Care Plan for [place] 1 OT Short Term Goal 1 (Week 1): LTG=STG  Recommendations for other services: Neuropsych   Skilled Therapeutic Intervention Ot eval initiated with OT purpose, role and goals discussed. Pt in significant amt of pain so mobility and functional ADLS were limited. Pt was only able to tolerate sitting EOB slightly enough to take his meds- pt declined breakfast at this time due to pain. ADLs: including bathing and donning a clean gown were performed at bed level in supine. Pt able to roll independently. Due to pain lower legs bathing and dressing were total A. Attempted to come to sit EOB with mod A ot come up but unable to tolerate any length of time. After time pt tried  again and attempted to stand with A but again too much pain- returned to supine. Pt c/o of pain from back, hip and down to his knee on right side.   Alerted MD and RN of intense pain and unable to tolerate anything. Pt had rubbed bilateral elbows - leaving them with a scab and red from repositioning self in bed so much - applied foam dressing to both elbows.  ADL ADL Eating: Set  up Grooming: Setup Upper Body Bathing: Setup Where Assessed-Upper Body Bathing: Bed level Lower Body Bathing: Minimal assistance Where Assessed-Lower Body Bathing: Bed level Upper Body Dressing: Minimal assistance Where Assessed-Upper Body Dressing: Bed level Lower Body Dressing: Other (Comment);Dependent (socks) Where Assessed-Lower Body Dressing: Bed level Toileting: Minimal assistance Toilet Transfer: Not assessed Social research officer, government: Not assessed Mobility  Bed Mobility Bed Mobility: Rolling Left;Supine to Sit;Rolling Right Rolling Right: Independent Rolling Left: Independent Supine to Sit: Minimal Assistance - Patient > 75% Transfers Sit to Stand: Moderate Assistance - Patient 50-74% Stand to Sit: Moderate Assistance - Patient 50-74%   Discharge Criteria: Patient will be discharged from OT if patient refuses treatment 3 consecutive times without medical reason, if treatment goals not met, if there is a change in medical status, if patient makes no progress towards goals or if patient is discharged from hospital.  The above assessment, treatment plan, treatment alternatives and goals were discussed and mutually agreed upon: by patient  Nicoletta Ba 11/19/2021, 12:04 PM

## 2021-11-19 NOTE — Progress Notes (Signed)
Orthopedic Tech Progress Note Patient Details:  Cameron [name] [birth year] [id number]  Order for [place] LSO brace called into [facility] @ 1040.  Patient ID: [name], male   DOB: 12-11-1950, 72 y.o.   MRN: [id number]  Carin Primrose 11/19/2021, 10:43 AM

## 2021-11-20 DIAGNOSIS — F17219 Nicotine dependence, cigarettes, with unspecified nicotine-induced disorders: Secondary | ICD-10-CM | POA: Diagnosis not present

## 2021-11-20 DIAGNOSIS — M79604 Pain in right leg: Secondary | ICD-10-CM

## 2021-11-20 DIAGNOSIS — M4646 Discitis, unspecified, lumbar region: Secondary | ICD-10-CM | POA: Diagnosis not present

## 2021-11-20 DIAGNOSIS — K219 Gastro-esophageal reflux disease without esophagitis: Secondary | ICD-10-CM | POA: Diagnosis not present

## 2021-11-20 LAB — CBC WITH DIFFERENTIAL/PLATELET
Abs Immature Granulocytes: 0.04 10*3/uL (ref 0.00–0.07)
Basophils Absolute: 0.1 10*3/uL (ref 0.0–0.1)
Basophils Relative: 2 %
Eosinophils Absolute: 0.3 10*3/uL (ref 0.0–0.5)
Eosinophils Relative: 3 %
HCT: 40 % (ref 39.0–52.0)
Hemoglobin: 13.7 g/dL (ref 13.0–17.0)
Immature Granulocytes: 1 %
Lymphocytes Relative: 30 %
Lymphs Abs: 2.2 10*3/uL (ref 0.7–4.0)
MCH: 32.4 pg (ref 26.0–34.0)
MCHC: 34.3 g/dL (ref 30.0–36.0)
MCV: 94.6 fL (ref 80.0–100.0)
Monocytes Absolute: 0.9 10*3/uL (ref 0.1–1.0)
Monocytes Relative: 13 %
Neutro Abs: 3.7 10*3/uL (ref 1.7–7.7)
Neutrophils Relative %: 51 %
Platelets: 202 10*3/uL (ref 150–400)
RBC: 4.23 MIL/uL (ref 4.22–5.81)
RDW: 13.2 % (ref 11.5–15.5)
WBC: 7.3 10*3/uL (ref 4.0–10.5)
nRBC: 0 % (ref 0.0–0.2)

## 2021-11-20 LAB — COMPREHENSIVE METABOLIC PANEL
ALT: 23 U/L (ref 0–44)
AST: 20 U/L (ref 15–41)
Albumin: 3.4 g/dL — ABNORMAL LOW (ref 3.5–5.0)
Alkaline Phosphatase: 40 U/L (ref 38–126)
Anion gap: 11 (ref 5–15)
BUN: 10 mg/dL (ref 8–23)
CO2: 28 mmol/L (ref 22–32)
Calcium: 9.1 mg/dL (ref 8.9–10.3)
Chloride: 100 mmol/L (ref 98–111)
Creatinine, Ser: 0.94 mg/dL (ref 0.61–1.24)
GFR, Estimated: >60 mL/min (ref >60)
Glucose, Bld: 104 mg/dL — ABNORMAL HIGH (ref 70–99)
Potassium: 4 mmol/L (ref 3.5–5.1)
Sodium: 139 mmol/L (ref 135–145)
Total Bilirubin: <0.1 mg/dL — ABNORMAL LOW (ref 0.3–1.2)
Total Protein: 6.4 g/dL — ABNORMAL LOW (ref 6.5–8.1)

## 2021-11-20 LAB — CK: Total CK: 33 U/L — ABNORMAL LOW (ref 49–397)

## 2021-11-20 NOTE — Progress Notes (Signed)
Occupational Therapy Session Note  Patient Details  Name: Cameron [name] MRN: [id number] Date of Birth: 24-Apr-1950  Today's Date: 11/20/2021 OT Individual Time: 7076-1518 OT Individual Time Calculation (min): 54 min    Short Term Goals: Week 1:  OT Short Term Goal 1 (Week 1): LTG=STG  Skilled Therapeutic Interventions/Progress Updates:    Pt resting in bed upon arrival with wife present. Initial focus on bed mobility and sitting balance EOB. Supine>sit EOB with supervision. Sitting balance EOB with supervision for approx 30 secs. Pt able to lateral scoot to EOB and return to supine with supervision. Pt requested use of bed pan. Pt states he would prefer to sit on BSC but unable to tolerate sitting upright. Pt bridged with BLE while pushing with BUE and raised buttocks sufficiently to facilitate placement of of bed pain. Pt used bed pan while partially seated upright and support with BUE. Pt performed hygiene wihtout assistance. Clothing mangement with supervison at bed level. Pt able to move all extremities with overall 5/5 strength. Discussed role of OT. Pt remained in bed with all needs within reach.   Therapy Documentation Precautions:  Precautions Precautions: Fall, Back Precaution Comments: Back precautions for pain control (no formal order) Restrictions Weight Bearing Restrictions: No Pain:  Pt reports sporadic 9/10 Rt low back/hip pain/spasms; repositioned  Therapy/Group: Individual Therapy  Leroy Libman 11/20/2021, 2:29 PM

## 2021-11-20 NOTE — Progress Notes (Signed)
Inpatient Rehabilitation  Patient information reviewed and entered into eRehab system by Telissa Palmisano M. Ashe Gago, M.A., CCC/SLP, PPS Coordinator.  Information including medical coding, functional ability and quality indicators will be reviewed and updated through discharge.    

## 2021-11-20 NOTE — Care Management (Signed)
[facility] Individual Statement of Services  Patient Name:  Cameron [name]  Date:  11/20/2021  Welcome to the [facility].  Our goal is to provide you with an individualized program based on your diagnosis and situation, designed to meet your specific needs.  With this comprehensive rehabilitation program, you will be expected to participate in at least 3 hours of rehabilitation therapies Monday-Friday, with modified therapy programming on the weekends.  Your rehabilitation program will include the following services:  Physical Therapy (PT), Occupational Therapy (OT), 24 hour per day rehabilitation nursing, Therapeutic Recreaction (TR), Psychology, Neuropsychology, Care Coordinator, Rehabilitation Medicine, [place], and Other  Weekly team conferences will be held on Tuesdays to discuss your progress.  Your Inpatient Rehabilitation Care Coordinator will talk with you frequently to get your input and to update you on team discussions.  Team conferences with you and your family in attendance may also be held.  Expected length of stay: 7-10 days  Overall anticipated outcome: Independent with Assistive Device  Depending on your progress and recovery, your program may change. Your Inpatient Rehabilitation Care Coordinator will coordinate services and will keep you informed of any changes. Your Inpatient Rehabilitation Care Coordinator's name and contact numbers are listed  below.  The following services may also be recommended but are not provided by the [facility] will be made to provide these services after discharge if needed.  Arrangements include referral to agencies that provide these services.  Your insurance has been verified to be:  Medicare A/B  Your primary  doctor is:  Domenick Gong  Pertinent information will be shared with your doctor and your insurance company.  Inpatient Rehabilitation Care Coordinator:  Cathleen Corti [phone] or ([phone]  Information discussed with and copy given to patient by: Rana Snare, 11/20/2021, 10:10 AM

## 2021-11-20 NOTE — Progress Notes (Signed)
Met with patient. Therapy in room. Oriented to rehab and that will have conference on Tuesday with entire care team and SW will f/u. Informed will be about discharge, barriers, progress, and equipment to get to home. Also discussed why here, provided ABT worksheet, CHG baths, information on quitting smoking, managing back pain without opioids and using foods to reduce inflammation, and constipation. Provided education sheets regarding Fentanyl patch and zanaflex.

## 2021-11-20 NOTE — Progress Notes (Signed)
Inpatient Rehabilitation Care Coordinator Assessment and Plan Patient Details  Name: Cameron [name] MRN: [id number] Date of Birth: 08/01/1950  Today's Date: 11/20/2021  Hospital Problems: Principal Problem:   Discitis  Past Medical History:  Past Medical History:  Diagnosis Date   Arthritis    Benign essential tremor    BPH (benign prostatic hyperplasia)    Concussion 1968   no residual from   COPD (chronic obstructive pulmonary disease) ([place])    no inhalers   COVID 03/04/2020   runny nose  at times  loss of taste and smell still present   GERD (gastroesophageal reflux disease)    History of hiatal hernia    small per dr Osborne Casco   History of testicular cancer 2005   right testicle removed and 15 radiation tx   Hx of gynecomastia    due to hcg shots for male infertility   Insomnia    Nocturia    Tobacco abuse    Varicocele    Wears glasses    for reading   Past Surgical History:  Past Surgical History:  Procedure Laterality Date   APPENDECTOMY  1965   open   IR LUMBAR [place] W/IMG GUIDE  11/13/2021   MASTECTOMY     1 sx for each breast enalrgement sx was 10 yrs apaprt   ORCHIECTOMY  2005   right   surgery for undescended testicle  age 63   right   THULIUM LASER TURP (TRANSURETHRAL RESECTION OF PROSTATE) N/A 06/10/2020   Procedure: THULIUM LASER TURP (TRANSURETHRAL RESECTION OF PROSTATE);  Surgeon: Festus Aloe, MD;  Location: [facility];  Service: Urology;  Laterality: N/A;   varicocoele  1983 or 1984   Social History:  reports that he has been smoking cigarettes. He has a 60.00 pack-year smoking history. His smokeless tobacco use includes chew. He reports that he does not currently use alcohol. He reports that he does not use drugs.  Family / Support Systems Marital Status: Married How Long?: 65 years (Anniversary 11/29) Patient Roles: Spouse, Parent Spouse/Significant Other: Cameron [name] (wife) [phone] Children: Two adult  children- Cameron [name] (lives in [place]) and Cameron [name] (lives in [place]). Able to help PRN. Other Supports: NOne reported Anticipated Caregiver: Wife Cameron [name] Ability/Limitations of Caregiver: Wife is able to provide 24/7 care however unable to lift pt Caregiver Availability: 24/7 Family Dynamics: Pt and wife live together.  Social History Preferred language: English Religion: Quaker Cultural Background: Pt served as a Gaffer for 39 years. Education: high school grad Health Literacy - How often do you need to have someone help you when you read instructions, pamphlets, or other written material from your doctor or pharmacy?: Never Writes: Yes Employment Status: Retired Date Retired/Disabled/Unemployed: 2013 Age Retired: 60 Public relations account executive Issues: Denies Guardian/Conservator: N/A   Abuse/Neglect Abuse/Neglect Assessment Can Be Completed: Yes Physical Abuse: Denies Verbal Abuse: Denies Sexual Abuse: Denies Exploitation of patient/patient's resources: Denies Self-Neglect: Denies  Patient response to: Social Isolation - How often do you feel lonely or isolated from those around you?: Never  Emotional Status Pt's affect, behavior and adjustment status: Pt in discomfort during visit as he reported pain. Otherwise, pt in good spirits and engaged in conversation well. Recent Psychosocial Issues: Denies Psychiatric History: Denies Substance Abuse History: Pt admits that he smokes 1.5 pack of cigarettes per day, and unsure if he will quit.  Patient / Family Perceptions, Expectations & Goals Pt/Family understanding of illness & functional limitations: Pt and family have a general understanding of  pt care needs Premorbid pt/family roles/activities: Independent PTA. Pt was mowing 2-5 acres of land and using a wood splitter. Anticipated changes in roles/activities/participation: Assistance with ADLs/IADLs Pt/family expectations/goals: pt goal is to work on his walking and  getting rid of the pain  [employer]: None Premorbid Home Care/DME Agencies: None Transportation available at discharge: Wife Is the patient able to respond to transportation needs?: Yes In the past 12 months, has lack of transportation kept you from medical appointments or from getting medications?: No In the past 12 months, has lack of transportation kept you from meetings, work, or from getting things needed for daily living?: No Resource referrals recommended: Neuropsychology  Discharge Planning Living Arrangements: Spouse/significant other Support Systems: Spouse/significant other, Children Type of Residence: Private residence Insurance Resources: [employer], Multimedia programmer (specify) ([place]) Museum/gallery curator Resources: [place] Referred: No Living Expenses: Own Money Management: Patient, Spouse Does the patient have any problems obtaining your medications?: No Home Management: Pt and wife both manage home care needs. Care Coordinator Barriers to Discharge: Decreased caregiver support, Lack of/limited family support, IV antibiotics Care Coordinator Anticipated Follow Up Needs: HH/OP Expected length of stay: 7-10 days  Clinical Impression SW met with pt and pt in room to introduce self, explain role, and discuss discharge process. Pt is not a English as a second language teacher. No HCPOA. No DME.   Cameron [name] A Cameron [name] 11/20/2021, 2:02 PM

## 2021-11-20 NOTE — Progress Notes (Signed)
Occupational Therapy Session Note  Patient Details  Name: Cameron [name] MRN: [id number] Date of Birth: 1950-10-15  Today's Date: 11/20/2021 OT Individual Time: 4114-6431 OT Individual Time Calculation (min): 60 min    Short Term Goals: Week 1:  OT Short Term Goal 1 (Week 1): LTG=STG  Skilled Therapeutic Interventions/Progress Updates:    Upon OT arrival, pt semi recumbent in bed with LPN present reporting increased pain in back/hip/knee. Pt cooperative and willing to participate in OT treatment. Treatment intervention with a focus on self care retraining and activity tolerance. Pt completes grooming, UB/LB dressing, toileting at the levels listed below. Pt limited significantly by radiating/shooting pain with witting upright and on EOB requring adjustments to bed with bed controls and support from UE when seated EOB ~5-8 seconds. Pt was educated on use of AE to improve independence with dressing including reacher, sock aid, shoe horn, and long handled sponge and demonstrates understanding of equipment. Pt remained majority of session in bed. Pt was left in bed at end of session with all needs met and safety measures in place.   Therapy Documentation Precautions:  Precautions Precautions: Fall, Back Precaution Comments: Back precautions for pain control (no formal order) Restrictions Weight Bearing Restrictions: No  ADL: ADL Grooming: Setup Where Assessed-Grooming: Bed level Upper Body Dressing: Setup Where Assessed-Upper Body Dressing: Bed level Lower Body Dressing: Supervision/safety Where Assessed-Lower Body Dressing: Bed level Toileting: Modified independent Where Assessed-Toileting: Bed level (urinal)    Therapy/Group: Individual Therapy  Marvetta Gibbons 11/20/2021, 9:16 AM

## 2021-11-20 NOTE — Progress Notes (Signed)
PMR Admission Coordinator Pre-Admission Assessment   Patient: Cameron [name] is an 71 y.o., male MRN: [id number] DOB: [birth year] Height: '5\' 9"'$  (175.3 cm) Weight: 75.8 kgPMR Admission Coordinator Pre-Admission Assessment   Patient: Cameron [name] is an 71 y.o., male MRN: [id number] DOB: 1950/11/08 Height: '5\' 9"'$  (175.3 cm) Weight: 75.8 kg   Insurance Information HMO:     PPO:      PCP:      IPA:      80/20:      OTHER:  PRIMARY: Medicare part A only      Policy#: [id number]      Subscriber: patient CM Name:        Phone#:       Fax#:   Pre-Cert#:        Employer:  Retired from post office Benefits:  Phone #:      Name: Youth worker in passport one [employer] Date: 02/02/18     Deduct: $1600      Out of Pocket Max: none      Life Max: N/A CIR: 100%      SNF: 100 days Outpatient:       Co-Pay:   Home Health: 100%      Co-Pay: none DME:       Co-Pay:   Providers: patient's choice  SECONDARY: Cigna      Policy#: [id number]     Phone#: [phone]   Financial Counselor:       Phone#:    The "Data Collection Information Summary" for patients in Inpatient Rehabilitation Facilities with attached "Privacy Act [place] Records" was provided and verbally reviewed with: Patient   Emergency Contact Information Contact Information       Name Relation Home Work Mobile    Scipio,[name] Spouse     [phone]           Current Medical History  Patient Admitting Diagnosis:  Lumbar L2-L3 discitis and osteomyelitis    History of Present Illness:  Cameron [name] is a 71 year old right-handed male with history of testicular cancer status postresection/orchiectomy 2005 and radiation therapy/BPH/anxiety/essential tremor maintained on Mysoline, tobacco use.  Per chart review patient lives with spouse.  Independent prior to admission.  Presented to [facility] 11/11/2021 with severe back pain that he experienced after splitting and stacking wood that slowly progressed.   He initially did see the orthopedic clinic started on steroid and muscle relaxer.  Unfortunately pain continued to worsen to the point he could not ambulate with radiating pain to the right lower extremity.  MRI lumbar spine showed fluid in the L2-3 disc space with some enhancement within the disc space and mild endplate enhancement.  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  Moderate to severe right and moderate left foraminal stenosis at L2-3.  Moderate left and mild right foraminal stenosis L3-4 as well as moderate to severe left subarticular and foraminal stenosis L4-5 and narrowing L4-5.  Admission chemistries unremarkable, sedimentation rate of 1, urinalysis negative.  Blood cultures no growth to date.  Underwent L2-3 disc aspiration per interventional radiology.  Aspiration culture currently with no growth so far.  Infectious disease Dr.Manandhar and placed on intravenous daptomycin and ceftriaxone x6 weeks ending 12/25/2021.  He is completing a Decadron taper.  Therapy evaluations completed due to patient decreased functional mobility was admitted for a comprehensive rehab programA very pleasant 71 y.o. male with medical history significant for testicular cancer status postresection and radiation, BPH, anxiety, and tremor, who presented to the emergency  department with severe back pain.  Patient was splitting and stacking wood on 11/04/2021 and was experiencing low back pain the following day that he attributed to the work he had been doing.  Pain progressively worsened, eventually prompting him to seek evaluation at an orthopedic clinic yesterday where he was started on steroid and muscle relaxer.  Unfortunately, pain continued to worsen and he is unable to ambulate due to the severe pain.  Pain is severe, radiating from the low back down the right leg, worse with any movement, and without any alleviating factors identified.  He denies any lower extremity numbness or weakness, denies fevers or chills, and  denies change in bowel or bladder habits.  ED Course: Upon arrival to the ED, patient is found to be afebrile and saturating well on room air with stable blood pressure.  Chemistry panel and CBC are unremarkable.  MRI of the lumbar spine is concerning for early discitis osteomyelitis and L2-3 without discrete abscess.  Patient was given a liter of saline, Dilaudid, Decadron, diclofenac in the emergency department.  ED PA discussed the case with neurosurgery who did not feel that they needed to be involved at this point.Underwent an IR guided lumbar disc L2-L3 disc aspiration on 11/13/21.  PT/OT evaluations completed with recommendations for acute inpatient rehab admission.   Patient's medical record from [facility] has been reviewed by the rehabilitation admission coordinator and physician.   Past Medical History      Past Medical History:  Diagnosis Date   Arthritis     Benign essential tremor     BPH (benign prostatic hyperplasia)     Concussion 1968    no residual from   COPD (chronic obstructive pulmonary disease) (HCC)      no inhalers   COVID 03/04/2020    runny nose  at times  loss of taste and smell still present   GERD (gastroesophageal reflux disease)     History of hiatal hernia      small per dr Osborne Casco   History of testicular cancer 2005    right testicle removed and 15 radiation tx   Hx of gynecomastia      due to hcg shots for male infertility   Insomnia     Nocturia     Tobacco abuse     Varicocele     Wears glasses      for reading      Has the patient had major surgery during 100 days prior to admission? Yes   Family History   family history includes High Cholesterol in his sister; Hypertension in his sister and sister; Memory loss in his sister; Prostate cancer in his brother and father; Stroke in his mother.   Current Medications   Current Facility-Administered Medications:    acetaminophen (TYLENOL) tablet 650 mg, 650 mg, Oral, Q6H PRN,  Opyd, Ilene Qua, MD   cefTRIAXone (ROCEPHIN) 2 g in sodium chloride 0.9 % 100 mL IVPB, 2 g, Intravenous, Q24H, Manandhar, Sabina, MD, Last Rate: 200 mL/hr at 11/16/21 1327, 2 g at 11/16/21 1327   Chlorhexidine Gluconate Cloth 2 % PADS 6 each, 6 each, Topical, Daily, Gherghe, Costin M, MD   clonazepam (KLONOPIN) disintegrating tablet 0.25 mg, 0.25 mg, Oral, QHS, Opyd, Timothy S, MD, 0.25 mg at 11/15/21 2141   DAPTOmycin (CUBICIN) 600 mg in sodium chloride 0.9 % IVPB, 8 mg/kg, Intravenous, Q2000, Rosiland Oz, MD, Stopped at 11/16/21 0657   dexamethasone (DECADRON) tablet 4 mg, 4 mg, Oral,  Q12H, 4 mg at 11/16/21 1320 **FOLLOWED BY** [START ON 11/18/2021] dexamethasone (DECADRON) tablet 4 mg, 4 mg, Oral, Daily, Gherghe, Costin M, MD   diclofenac (FLECTOR) 1.3 % 1 patch, 1 patch, Transdermal, BID, Opyd, Ilene Qua, MD, 1 patch at 11/16/21 0932   fentaNYL (DURAGESIC) 12 MCG/HR 1 patch, 1 patch, Transdermal, Q72H, Caren Griffins, MD, 1 patch at 11/16/21 1038   gabapentin (NEURONTIN) capsule 300 mg, 300 mg, Oral, TID, Tawanna Solo, Amrit, MD, 300 mg at 11/16/21 0926   HYDROmorphone (DILAUDID) injection 1 mg, 1 mg, Intravenous, Q3H PRN, Tawanna Solo, Amrit, MD, 1 mg at 11/16/21 1321   HYDROmorphone (DILAUDID) tablet 4 mg, 4 mg, Oral, Q4H PRN, Caren Griffins, MD, 4 mg at 11/16/21 1508   methocarbamol (ROBAXIN) 500 mg in dextrose 5 % 50 mL IVPB, 500 mg, Intravenous, Q6H PRN, Opyd, Ilene Qua, MD, Stopped at 11/16/21 0757   ondansetron (ZOFRAN) tablet 4 mg, 4 mg, Oral, Q6H PRN **OR** ondansetron (ZOFRAN) injection 4 mg, 4 mg, Intravenous, Q6H PRN, Opyd, Ilene Qua, MD, 4 mg at 11/12/21 1434   pantoprazole (PROTONIX) EC tablet 40 mg, 40 mg, Oral, Daily, Opyd, Ilene Qua, MD, 40 mg at 11/16/21 0926   polyethylene glycol (MIRALAX / GLYCOLAX) packet 17 g, 17 g, Oral, Daily, Adhikari, Amrit, MD, 17 g at 11/16/21 1027   primidone (MYSOLINE) tablet 100 mg, 100 mg, Oral, q1600, Opyd, Ilene Qua, MD, 100 mg at 11/15/21  1558   primidone (MYSOLINE) tablet 150 mg, 150 mg, Oral, BID, Opyd, Ilene Qua, MD, 150 mg at 11/16/21 0926   senna-docusate (Senokot-S) tablet 1 tablet, 1 tablet, Oral, BID, Caren Griffins, MD, 1 tablet at 11/16/21 0926   sertraline (ZOLOFT) tablet 50 mg, 50 mg, Oral, Daily, Opyd, Ilene Qua, MD, 50 mg at 11/16/21 0926   sodium chloride flush (NS) 0.9 % injection 10-40 mL, 10-40 mL, Intracatheter, Q12H, Gherghe, Costin M, MD, 10 mL at 11/16/21 1321   sodium chloride flush (NS) 0.9 % injection 10-40 mL, 10-40 mL, Intracatheter, PRN, Caren Griffins, MD   Patients Current Diet:  Diet Order                  Diet regular Room service appropriate? Yes; Fluid consistency: Thin  Diet effective now                         Precautions / Restrictions Precautions Precautions: Fall, Back Precaution Booklet Issued: Yes (comment) Precaution Comments: Back precautions for pain control (no formal order) Restrictions Weight Bearing Restrictions: No    Has the patient had 2 or more falls or a fall with injury in the past year? No   Prior Activity Level Community (5-7x/wk): Went out daily, was driving, splits wood regularly.   Prior Functional Level Self Care: Did the patient need help bathing, dressing, using the toilet or eating? Independent   Indoor Mobility: Did the patient need assistance with walking from room to room (with or without device)? Independent   Stairs: Did the patient need assistance with internal or external stairs (with or without device)? Independent   Functional Cognition: Did the patient need help planning regular tasks such as shopping or remembering to take medications? Independent   Patient Information Are you of Hispanic, Latino/a,or Spanish origin?: A. No, not of Hispanic, Latino/a, or Spanish origin What is your race?: A. White Do you need or want an interpreter to communicate with a doctor or health care staff?: 0. No  Patient's Response To:  Health  Literacy and Transportation Is the patient able to respond to health literacy and transportation needs?: Yes Health Literacy - How often do you need to have someone help you when you read instructions, pamphlets, or other written material from your doctor or pharmacy?: Never In the past 12 months, has lack of transportation kept you from medical appointments or from getting medications?: No In the past 12 months, has lack of transportation kept you from meetings, work, or from getting things needed for daily living?: No   Development worker, international aid / [place] Devices/Equipment: None Home Equipment: Grab bars - tub/shower   Prior Device Use: Indicate devices/aids used by the patient prior to current illness, exacerbation or injury? None of the above   Current Functional Level Cognition   Overall Cognitive Status: Within Functional Limits for tasks assessed    Extremity Assessment (includes Sensation/Coordination)   Upper Extremity Assessment: Overall WFL for tasks assessed  Lower Extremity Assessment: RLE deficits/detail RLE Deficits / Details: Pain increased in back with movement; ROM WFL; MMT: 2/5 throughout but limited due to pain     ADLs         Mobility   Overal bed mobility: Needs Assistance Bed Mobility: Rolling, Sidelying to Sit, Sit to Sidelying Rolling: Min assist Sidelying to sit: Min assist Sit to sidelying: Mod assist General bed mobility comments: Educated on log roll technique and performed well to sitting with min A for R leg and trunk.  However, once standing pt so painful that he needed to just lay down - required mod A for legs     Transfers   Overall transfer level: Needs assistance Equipment used: Rolling walker (2 wheels) Transfers: Sit to/from Stand Sit to Stand: Mod assist, Min guard General transfer comment: Initial min guard to rise but when pt had spasm/shooting pain he extends back and buckles at knees requiring mod A.  Transitioned to  standing quickly from sitting b/c unable to tolerate sitting R hip     Ambulation / Gait / Stairs / Wheelchair Mobility   Ambulation/Gait General Gait Details: unable     Posture / Balance Dynamic Sitting Balance Sitting balance - Comments: Requiring bil UE to lift body and keep weight off R side, for pain control not balance; pt was unable to tolerate sitting -tried leaning L but still painful Balance Overall balance assessment: Needs assistance Sitting-balance support: Bilateral upper extremity supported Sitting balance-Leahy Scale: Fair Sitting balance - Comments: Requiring bil UE to lift body and keep weight off R side, for pain control not balance; pt was unable to tolerate sitting -tried leaning L but still painful Standing balance support: Bilateral upper extremity supported Standing balance-Leahy Scale: Poor Standing balance comment: Requiring RW to support weight in arm due to severe pain in R leg; no weight on R Leg and pt still with spasms/stabbing pain causing trunk extension and knee flexion; unable to tolerate standing more than 5-10 seconds     Special needs/care consideration Continuous Drip IV  Rocephin IV antibiotics and Skin lumbar puncture site for disc aspiration post IR    Previous Home Environment (from acute therapy documentation) Living Arrangements: Spouse/significant other Available Help at Discharge: Family, Available 24 hours/day Type of Home: House Home Layout: Two level, Able to live on main level with bedroom/bathroom Home Access: Stairs to enter [employer] of Steps: in front 3 steps with rail on R; in back 5 steps with rails on both sides -can reach both but  would have longer to walk Bathroom Shower/Tub: Tub/shower unit, Walk-in shower (walk in is upstairs) 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 [place]: No   Discharge Living Setting Plans for Discharge Living Setting: Patient's home, House, Lives with (comment)  (Lives with wife.) Type of Home at Discharge: House Discharge Home Layout: Two level, Able to live on main level with bedroom/bathroom Alternate Level Stairs-Number of Steps: Flight Discharge Home Access: Stairs to enter Entrance Stairs-Rails: Right Entrance Stairs-Number of Steps: 3 steps up to porch Discharge Bathroom Shower/Tub: Tub/shower unit, Curtain Discharge Bathroom Toilet: Standard Discharge Bathroom Accessibility: Yes How Accessible: Accessible via walker Does the patient have any problems obtaining your medications?: No   Social/Family/Support Systems Patient Roles: Spouse (Has a wife.) Contact Information: Gerry Blanchfield Anticipated Caregiver: Wife - Hassan Rowan Ability/Limitations of Caregiver: Wife can assist but cannot lift patient Caregiver Availability: 24/7 Discharge Plan Discussed with Primary Caregiver: Yes Is Caregiver In Agreement with Plan?: Yes Does Caregiver/Family have Issues with Lodging/Transportation while Pt is in Rehab?: No   Goals Patient/Family Goal for Rehab: PT/OT supervision goals Expected length of stay: 7-10 days Pt/Family Agrees to Admission and willing to participate: Yes Program Orientation Provided & Reviewed with Pt/Caregiver Including Roles  & Responsibilities: Yes   Decrease burden of Care through IP rehab admission: N/A   Possible need for SNF placement upon discharge: Not anticipated   Patient Condition: I have reviewed medical records from [facility], spoken with CM, and patient and spouse. I discussed via phone for inpatient rehabilitation assessment.  Patient will benefit from ongoing PT and OT, can actively participate in 3 hours of therapy a day 5 days of the week, and can make measurable gains during the admission.  Patient will also benefit from the coordinated team approach during an Inpatient Acute Rehabilitation admission.  The patient will receive intensive therapy as well as Rehabilitation physician, nursing,  social worker, and care management interventions.  Due to bladder management, bowel management, safety, skin/wound care, disease management, medication administration, pain management, and patient education the patient requires 24 hour a day rehabilitation nursing.  The patient is currently min-mod A  with mobility and basic ADLs.  Discharge setting and therapy post discharge at home with home health is anticipated.  Patient has agreed to participate in the Acute Inpatient Rehabilitation Program and will admit today.   Preadmission Screen Completed By:  Retta Diones, 11/16/2021 3:09 PM ______________________________________________________________________   Discussed status with Dr. Tressa Busman  on 930 at 11/18/21 and received approval for admission today.   Admission Coordinator:  Retta Diones, RN, time 930/Date 11/18/21    Assessment/Plan: Diagnosis: Does the need for close, 24 hr/day Medical supervision in concert with the patient's rehab needs make it unreasonable for this patient to be served in a less intensive setting? Yes Co-Morbidities requiring supervision/potential complications: Osteomyelitis on IV abx, intractable pain, anxiety, HTN Due to safety, skin/wound care, disease management, medication administration, pain management, and patient education, does the patient require 24 hr/day rehab nursing? Yes Does the patient require coordinated care of a physician, rehab nurse, PT, OT to address physical and functional deficits in the context of the above medical diagnosis(es)? Yes Addressing deficits in the following areas: balance, endurance, locomotion, strength, transferring, bathing, dressing, and toileting Can the patient actively participate in an intensive therapy program of at least 3 hrs of therapy 5 days a week? Yes The potential for patient to make measurable gains while on inpatient rehab is good Anticipated functional outcomes  upon discharge from inpatient rehab: modified  independent and supervision PT, supervision OT,  Estimated rehab length of stay to reach the above functional goals is: 10-14 days Anticipated discharge destination: Home 10. 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     Deduct: $1600      Out of Pocket Max: none      Life Max: N/A CIR: 100%      SNF: 100 days Outpatient:       Co-Pay:   Home Health: 100%      Co-Pay: none DME:       Co-Pay:   Providers: patient's choice  SECONDARY: [employer]      Policy#: [id number]     Phone#: [phone]   Financial Counselor:       Phone#:    The "Data Collection Information Summary" for patients in Inpatient Rehabilitation Facilities with attached "Privacy Act [place] Records" was provided and verbally reviewed with: Patient   Emergency Contact Information Contact Information       Name Relation Home Work Mobile    Urich,[name] Spouse     [phone]           Current Medical History  Patient Admitting Diagnosis:  Lumbar L2-L3 discitis and osteomyelitis    History of Present Illness:  Cameron [name] is a 71 year old right-handed male with history of testicular cancer status postresection/orchiectomy 2005 and radiation therapy/BPH/anxiety/essential tremor maintained on Mysoline, tobacco use.  Per chart review patient lives with spouse.  Independent prior to admission.  Presented to [facility] 11/11/2021 with severe back pain that he experienced after splitting and stacking wood that slowly progressed.  He initially did see the orthopedic clinic started on steroid and muscle  relaxer.  Unfortunately pain continued to worsen to the point he could not ambulate with radiating pain to the right lower extremity.  MRI lumbar spine showed fluid in the L2-3 disc space with some enhancement within the disc space and mild endplate enhancement.  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  Moderate to severe right and moderate left foraminal stenosis at L2-3.  Moderate left and mild right foraminal stenosis L3-4 as well as moderate to severe left subarticular and foraminal stenosis L4-5 and narrowing L4-5.  Admission chemistries unremarkable, sedimentation rate of 1, urinalysis negative.  Blood cultures no growth to date.  Underwent L2-3 disc aspiration per interventional radiology.  Aspiration culture currently with no growth so far.  Infectious disease Dr.Manandhar and placed on intravenous daptomycin and ceftriaxone x6 weeks ending 12/25/2021.  He is completing a Decadron taper.  Therapy evaluations completed due to patient decreased functional mobility was admitted for a comprehensive rehab programA very pleasant 71 y.o. male with medical history significant for testicular cancer status postresection and radiation, BPH, anxiety, and tremor, who presented to the emergency department with severe back  pain.  Patient was splitting and stacking wood on 11/04/2021 and was experiencing low back pain the following day that he attributed to the work he had been doing.  Pain progressively worsened, eventually prompting him to seek evaluation at an orthopedic clinic yesterday where he was started on steroid and muscle relaxer.  Unfortunately, pain continued to worsen and he is unable to ambulate due to the severe pain.  Pain is severe, radiating from the low back down the right leg, worse with any movement, and without any alleviating factors identified.  He denies any lower extremity numbness or weakness, denies fevers or chills, and denies change in bowel or bladder habits.  ED Course: Upon arrival to the  ED, patient is found to be afebrile and saturating well on room air with stable blood pressure.  Chemistry panel and CBC are unremarkable.  MRI of the lumbar spine is concerning for early discitis osteomyelitis and L2-3 without discrete abscess.  Patient was given a liter of saline, Dilaudid, Decadron, diclofenac in the emergency department.  ED PA discussed the case with neurosurgery who did not feel that they needed to be involved at this point.Underwent an IR guided lumbar disc L2-L3 disc aspiration on 11/13/21.  PT/OT evaluations completed with recommendations for acute inpatient rehab admission.   Patient's medical record from [facility] has been reviewed by the rehabilitation admission coordinator and physician.   Past Medical History      Past Medical History:  Diagnosis Date   Arthritis     Benign essential tremor     BPH (benign prostatic hyperplasia)     Concussion 1968    no residual from   COPD (chronic obstructive pulmonary disease) (HCC)      no inhalers   COVID 03/04/2020    runny nose  at times  loss of taste and smell still present   GERD (gastroesophageal reflux disease)     History of hiatal hernia      small per dr Osborne Casco   History of testicular cancer 2005    right testicle removed and 15 radiation tx   Hx of gynecomastia      due to hcg shots for male infertility   Insomnia     Nocturia     Tobacco abuse     Varicocele     Wears glasses      for reading      Has the patient had major surgery during 100 days prior to admission? Yes   Family History   family history includes High Cholesterol in his sister; Hypertension in his sister and sister; Memory loss in his sister; Prostate cancer in his brother and father; Stroke in his mother.   Current Medications   Current Facility-Administered Medications:    acetaminophen (TYLENOL) tablet 650 mg, 650 mg, Oral, Q6H PRN, Opyd, Ilene Qua, MD   cefTRIAXone (ROCEPHIN) 2 g in sodium chloride 0.9 %  100 mL IVPB, 2 g, Intravenous, Q24H, Manandhar, Sabina, MD, Last Rate: 200 mL/hr at 11/16/21 1327, 2 g at 11/16/21 1327   Chlorhexidine Gluconate Cloth 2 % PADS 6 each, 6 each, Topical, Daily, Gherghe, Costin M, MD   clonazepam (KLONOPIN) disintegrating tablet 0.25 mg, 0.25 mg, Oral, QHS, Opyd, Timothy S, MD, 0.25 mg at 11/15/21 2141   DAPTOmycin (CUBICIN) 600 mg in sodium chloride 0.9 % IVPB, 8 mg/kg, Intravenous, Q2000, Rosiland Oz, MD, Stopped at 11/16/21 0657   dexamethasone (DECADRON) tablet 4 mg, 4 mg, Oral, Q12H, 4 mg at  11/16/21 1320 **FOLLOWED BY** [START ON 11/18/2021] dexamethasone (DECADRON) tablet 4 mg, 4 mg, Oral, Daily, Gherghe, Costin M, MD   diclofenac (FLECTOR) 1.3 % 1 patch, 1 patch, Transdermal, BID, Opyd, Ilene Qua, MD, 1 patch at 11/16/21 0932   fentaNYL (DURAGESIC) 12 MCG/HR 1 patch, 1 patch, Transdermal, Q72H, Caren Griffins, MD, 1 patch at 11/16/21 1038   gabapentin (NEURONTIN) capsule 300 mg, 300 mg, Oral, TID, Tawanna Solo, Amrit, MD, 300 mg at 11/16/21 0926   HYDROmorphone (DILAUDID) injection 1 mg, 1 mg, Intravenous, Q3H PRN, Tawanna Solo, Amrit, MD, 1 mg at 11/16/21 1321   HYDROmorphone (DILAUDID) tablet 4 mg, 4 mg, Oral, Q4H PRN, Caren Griffins, MD, 4 mg at 11/16/21 1508   methocarbamol (ROBAXIN) 500 mg in dextrose 5 % 50 mL IVPB, 500 mg, Intravenous, Q6H PRN, Opyd, Ilene Qua, MD, Stopped at 11/16/21 0757   ondansetron (ZOFRAN) tablet 4 mg, 4 mg, Oral, Q6H PRN **OR** ondansetron (ZOFRAN) injection 4 mg, 4 mg, Intravenous, Q6H PRN, Opyd, Ilene Qua, MD, 4 mg at 11/12/21 1434   pantoprazole (PROTONIX) EC tablet 40 mg, 40 mg, Oral, Daily, Opyd, Ilene Qua, MD, 40 mg at 11/16/21 0926   polyethylene glycol (MIRALAX / GLYCOLAX) packet 17 g, 17 g, Oral, Daily, Adhikari, Amrit, MD, 17 g at 11/16/21 3790   primidone (MYSOLINE) tablet 100 mg, 100 mg, Oral, q1600, Opyd, Ilene Qua, MD, 100 mg at 11/15/21 1558   primidone (MYSOLINE) tablet 150 mg, 150 mg, Oral, BID, Opyd, Ilene Qua,  MD, 150 mg at 11/16/21 0926   senna-docusate (Senokot-S) tablet 1 tablet, 1 tablet, Oral, BID, Caren Griffins, MD, 1 tablet at 11/16/21 0926   sertraline (ZOLOFT) tablet 50 mg, 50 mg, Oral, Daily, Opyd, Ilene Qua, MD, 50 mg at 11/16/21 0926   sodium chloride flush (NS) 0.9 % injection 10-40 mL, 10-40 mL, Intracatheter, Q12H, Gherghe, Costin M, MD, 10 mL at 11/16/21 1321   sodium chloride flush (NS) 0.9 % injection 10-40 mL, 10-40 mL, Intracatheter, PRN, Caren Griffins, MD   Patients Current Diet:  Diet Order                  Diet regular Room service appropriate? Yes; Fluid consistency: Thin  Diet effective now                         Precautions / Restrictions Precautions Precautions: Fall, Back Precaution Booklet Issued: Yes (comment) Precaution Comments: Back precautions for pain control (no formal order) Restrictions Weight Bearing Restrictions: No    Has the patient had 2 or more falls or a fall with injury in the past year? No   Prior Activity Level Community (5-7x/wk): Went out daily, was driving, splits wood regularly.   Prior Functional Level Self Care: Did the patient need help bathing, dressing, using the toilet or eating? Independent   Indoor Mobility: Did the patient need assistance with walking from room to room (with or without device)? Independent   Stairs: Did the patient need assistance with internal or external stairs (with or without device)? Independent   Functional Cognition: Did the patient need help planning regular tasks such as shopping or remembering to take medications? Independent   Patient Information Are you of Hispanic, Latino/a,or Spanish origin?: A. No, not of Hispanic, Latino/a, or Spanish origin What is your race?: A. White Do you need or want an interpreter to communicate with a doctor or health care staff?: 0. 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 None of the above   Current Functional Level Cognition   Overall Cognitive Status: Within Functional Limits for tasks assessed    Extremity Assessment (includes Sensation/Coordination)   Upper Extremity Assessment: Overall WFL for tasks assessed  Lower Extremity Assessment: RLE deficits/detail RLE Deficits / Details: Pain increased in back with movement; ROM WFL; MMT: 2/5 throughout but limited due to pain     ADLs         Mobility   Overal bed mobility: Needs Assistance Bed Mobility: Rolling, Sidelying to Sit, Sit to Sidelying Rolling: Min assist Sidelying to sit: Min assist Sit to sidelying: Mod assist General bed mobility comments: Educated on log roll technique and performed well to sitting with min A for R leg and trunk.  However, once standing pt so painful that he needed to just lay down - required mod A for legs     Transfers   Overall transfer level: Needs assistance Equipment used: Rolling walker (2 wheels) Transfers: Sit to/from Stand Sit to Stand: Mod assist, Min guard General transfer comment: Initial min guard to rise but when pt had spasm/shooting pain he extends back and buckles at knees requiring mod A.  Transitioned to standing quickly from sitting b/c unable to tolerate sitting R hip     Ambulation /  Gait / Stairs / Wheelchair Mobility   Ambulation/Gait General Gait Details: unable     Posture / Balance Dynamic Sitting Balance Sitting balance - Comments: Requiring bil UE to lift body and keep weight off R side, for pain control not balance; pt was unable to tolerate sitting -tried leaning L but still painful Balance Overall balance assessment: Needs assistance Sitting-balance support: Bilateral upper extremity supported Sitting balance-Leahy Scale: Fair Sitting balance - Comments: Requiring bil UE to lift body and keep weight off R side, for pain control not balance; pt was unable to tolerate sitting -tried leaning L but still painful Standing balance support: Bilateral upper extremity supported Standing balance-Leahy Scale: Poor Standing balance comment: Requiring RW to support weight in arm due to severe pain in R leg; no weight on R Leg and pt still with spasms/stabbing pain causing trunk extension and knee flexion; unable to tolerate standing more than 5-10 seconds     Special needs/care consideration Continuous Drip IV  Rocephin IV antibiotics and Skin lumbar puncture site for disc aspiration post IR    Previous Home Environment (from acute therapy documentation) Living Arrangements: Spouse/significant other Available Help at Discharge: Family, Available 24 hours/day Type of Home: House Home Layout: Two level, Able to live on main level with bedroom/bathroom Home Access: Stairs to enter [employer] of Steps: in front 3 steps with rail on R; in back 5 steps with rails on both sides -can reach both but would have longer to  walk Bathroom Shower/Tub: Tub/shower unit, Walk-in shower (walk in is upstairs) 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 [place]: No   Discharge Living Setting Plans for Discharge Living Setting: Patient's home, House, Lives with (comment) (Lives with wife.) Type of Home at Discharge: House Discharge Home Layout: Two level,  Able to live on main level with bedroom/bathroom Alternate Level Stairs-Number of Steps: Flight Discharge Home Access: Stairs to enter Entrance Stairs-Rails: Right Entrance Stairs-Number of Steps: 3 steps up to porch Discharge Bathroom Shower/Tub: Tub/shower unit, Curtain Discharge Bathroom Toilet: Standard Discharge Bathroom Accessibility: Yes How Accessible: Accessible via walker Does the patient have any problems obtaining your medications?: No   Social/Family/Support Systems Patient Roles: Spouse (Has a wife.) Contact Information: [place] Viverette Anticipated Caregiver: Wife - Hassan Rowan Ability/Limitations of Caregiver: Wife can assist but cannot lift patient Caregiver Availability: 24/7 Discharge Plan Discussed with Primary Caregiver: Yes Is Caregiver In Agreement with Plan?: Yes Does Caregiver/Family have Issues with Lodging/Transportation while Pt is in Rehab?: No   Goals Patient/Family Goal for Rehab: PT/OT supervision goals Expected length of stay: 7-10 days Pt/Family Agrees to Admission and willing to participate: Yes Program Orientation Provided & Reviewed with Pt/Caregiver Including Roles  & Responsibilities: Yes   Decrease burden of Care through IP rehab admission: N/A   Possible need for SNF placement upon discharge: Not anticipated   Patient Condition: I have reviewed medical records from [facility], spoken with CM, and patient and spouse. I discussed via phone for inpatient rehabilitation assessment.  Patient will benefit from ongoing PT and OT, can actively participate in 3 hours of therapy a day 5 days of the week, and can make measurable gains during the admission.  Patient will also benefit from the coordinated team approach during an Inpatient Acute Rehabilitation admission.  The patient will receive intensive therapy as well as Rehabilitation physician, nursing, social worker, and care management interventions.  Due to bladder management, bowel  management, safety, skin/wound care, disease management, medication administration, pain management, and patient education the patient requires 24 hour a day rehabilitation nursing.  The patient is currently min-mod A  with mobility and basic ADLs.  Discharge setting and therapy post discharge at home with home health is anticipated.  Patient has agreed to participate in the Acute Inpatient Rehabilitation Program and will admit today.   Preadmission Screen Completed By:  Retta Diones, 11/16/2021 3:09 PM ______________________________________________________________________   Discussed status with Dr. Tressa Busman  on 930 at 11/18/21 and received approval for admission today.   Admission Coordinator:  Retta Diones, RN, time 930/Date 11/18/21    Assessment/Plan: Diagnosis: Does the need for close, 24 hr/day Medical supervision in concert with the patient's rehab needs make it unreasonable for this patient to be served in a less intensive setting? Yes Co-Morbidities requiring supervision/potential complications: Osteomyelitis on IV abx, intractable pain, anxiety, HTN Due to safety, skin/wound care, disease management, medication administration, pain management, and patient education, does the patient require 24 hr/day rehab nursing? Yes Does the patient require coordinated care of a physician, rehab nurse, PT, OT to address physical and functional deficits in the context of the above medical diagnosis(es)? Yes Addressing deficits in the following areas: balance, endurance, locomotion, strength, transferring, bathing, dressing, and toileting Can the patient actively participate in an intensive therapy program of at least 3 hrs of therapy 5 days a week? Yes The potential for patient to make measurable gains while on inpatient rehab is good Anticipated functional outcomes upon discharge from inpatient  rehab: modified independent and supervision PT, supervision OT,  Estimated rehab length of stay to reach  the above functional goals is: 10-14 days Anticipated discharge destination: Home 10. Overall Rehab/Functional Prognosis: good     MD Signature:   Gertie Gowda, DO 11/18/2021

## 2021-11-20 NOTE — Progress Notes (Signed)
Physical Therapy Session Note  Patient Details  Name: Cameron [name] MRN: [id number] Date of Birth: 1950/04/30  Today's Date: 11/20/2021 PT Individual Time: 1430-1535 PT Individual Time Calculation (min): 65 min   Short Term Goals: Week 1:  PT Short Term Goal 1 (Week 1): STG=LTG due to ELOS  Skilled Therapeutic Interventions/Progress Updates:    Pt received in bed and agreeable to therapy. Pt reports 8/10 at rest, up from 6/10 at baseline. premedicated. Rest and positioning provided as needed. Noted spasms and pain throughout session. Pt coached in deep slow breathing for pain management. Pt performed bed level exercises this session d/t increased pain.   Pt performed the following exercises to promote LE strength and endurance:  Bridges 3 x 10  with RLE staggered out for pain management SLR with 7lb ankle weight on LLE, no weight on RLE, 3 x 10  Single leg press against manual resistance 2 x 6 BIL  Chest press with 14 lbs on dowel rod, 2 x 12, superset with shoulder flexion with same 14 lb rod 2 x 12, 1 x 6 of each d/t fatigue.  Pt remained in bed at end of session with his wife present, was left with all needs in reach and his wife present.   Therapy Documentation Precautions:  Precautions Precautions: Fall, Back Precaution Comments: Back precautions for pain control (no formal order) Restrictions Weight Bearing Restrictions: No General:   Vital Signs: Therapy Vitals Temp: 97.9 F (36.6 C) Pulse Rate: 69 Resp: 16 BP: (!) 142/57 Patient Position (if appropriate): Lying Oxygen Therapy SpO2: 95 % Pain:   Mobility:   Locomotion :    Trunk/Postural Assessment :    Balance:   Exercises:   Other Treatments:      Therapy/Group: Individual Therapy  Mickel Fuchs 11/20/2021, 2:50 PM

## 2021-11-20 NOTE — Progress Notes (Addendum)
PROGRESS NOTE   Subjective/Complaints:  Pt continues to have pain in his RLE, slightly improved from prior few days. No new concerns or complaints. He had MRI repeat yesterday-stable.   LBM 9/17   ROS: Denies fevers, chills, N/V, abdominal pain, constipation, diarrhea, SOB, cough, chest pain, new weakness or paraesthesias.   + RLE pain  Objective:   MR Lumbar Spine W Wo Contrast  Result Date: 11/19/2021 CLINICAL DATA:  L2-L3 osteomyelitis discitis follow-up. EXAM: MRI LUMBAR SPINE WITHOUT AND WITH CONTRAST TECHNIQUE: Multiplanar and multiecho pulse sequences of the lumbar spine were obtained without and with intravenous contrast. CONTRAST:  7 mL view a intravenous contrast. COMPARISON:  MRI lumbar spine dated November 11, 2021. FINDINGS: Segmentation:  Standard. Alignment: Unchanged dextroscoliosis similar trace retrolisthesis at L1-L2 and L2-L3. Vertebrae: Degree of fluid within the L2-L3 disc space has significantly decreased. Mild surrounding endplate enhancement is unchanged. No progressive findings. Similar degenerative endplate marrow edema at L4-L5. Conus medullaris and cauda equina: Conus extends to the L1 level. Conus and cauda equina appear normal. No abnormal intrathecal enhancement. Paraspinal and other soft tissues: Negative. No paravertebral inflammatory change. Disc levels: T12-L1:  Negative. L1-L2: Unchanged mild-to-moderate left eccentric disc bulging. No stenosis. L2-L3: Unchanged mild-to-moderate right eccentric disc bulging and endplate spurring with asymmetric right facet arthropathy and ligamentum flavum hypertrophy. Unchanged moderate spinal canal and right lateral recess stenosis. Unchanged moderate to severe right and mild left neuroforaminal stenosis. L3-L4: Unchanged mild disc bulging with superimposed right foraminal and far lateral disc protrusion displacing the exiting right L3 nerve root. Unchanged  mild-to-moderate bilateral facet arthropathy. Unchanged mild bilateral neuroforaminal stenosis. No spinal canal stenosis. L4-L5: Unchanged moderate disc bulging with left far lateral disc osteophyte complex. Unchanged moderate left and mild right facet arthropathy. Unchanged moderate spinal canal and bilateral lateral recess stenosis. Unchanged moderate to severe left and mild right neuroforaminal stenosis. L5-S1: Unchanged small broad-based posterior disc protrusion. No stenosis. IMPRESSION: 1. Decreased fluid within the L2-L3 disc space with unchanged mild surrounding endplate irregularity/enhancement. No progressive findings. This remains indeterminate for osteomyelitis-discitis versus degenerative change. Correlate with disc aspiration cultures. 2. Unchanged multilevel lumbar spondylosis as described above. Unchanged moderate spinal canal, right lateral recess, and moderate to severe right neuroforaminal stenosis at L2-L3. 3. Unchanged right foraminal and far lateral disc protrusion at L3-L4 displacing the exiting right L3 nerve root. 4. Unchanged moderate spinal canal and bilateral lateral recess stenosis at L4-L5 with moderate to severe left neuroforaminal stenosis. Electronically Signed   By: Titus Dubin M.D.   On: 11/19/2021 13:11   Recent Labs    11/19/21 0333 11/20/21 0430  WBC 6.9 7.3  HGB 13.4 13.7  HCT 40.2 40.0  PLT 192 202    Recent Labs    11/19/21 0333 11/20/21 0430  NA 138 139  K 3.6 4.0  CL 98 100  CO2 30 28  GLUCOSE 95 104*  BUN 12 10  CREATININE 0.90 0.94  CALCIUM 9.0 9.1     Intake/Output Summary (Last 24 hours) at 11/20/2021 0921 Last data filed at 11/20/2021 0754 Gross per 24 hour  Intake 611.83 ml  Output 1375 ml  Net -763.17 ml  Physical Exam: Vital Signs Blood pressure (!) 148/66, pulse 75, temperature 97.9 F (36.6 C), temperature source Oral, resp. rate 16, height 5' 9" (1.753 m), weight 72.6 kg, SpO2 93 %.  Constitutional: Distressed,  bracing against bed, intermittent jerks form paraspinal muscle spasms HENT: No JVD. Neck Supple. Trachea midline. Atraumatic, normocephalic. Eyes: PERRLA. EOMI. Visual fields grossly intact.  Cardiovascular: RRR, no murmurs/rub/gallops. No Edema. Peripheral pulses 2+  Respiratory: CTAB. No rales, rhonchi, or wheezing. On RA.  Abdomen: + bowel sounds, normoactive. ND, NT Skin: C/D/I. No apparent lesions. Back biopsy area healed.  MSK:      No apparent deformity.      Strength:                RUE: 5/5 SA, 5/5 EF, 5/5 EE, 5/5 WE, 5/5 FF, 5/5 FA                 LUE: 5/5 SA, 5/5 EF, 5/5 EE, 5/5 WE, 5/5 FF, 5/5 FA                 RLE: 2/5 HF, 3/5 KE, 4/5 DF, 1/5 EHL (congenital), 4/5 PF  - limited by pain                 LLE:  5/5 HF, 5/5 KE, 5/5 DF, 5/5 EHL, 5/5 PF    Neurologic exam: follows commands Cognition: AAO to person, place, time and event.  Sensation: Altered/sensitive to light touch over medial right knee, wrapping to R hip and back following L3 dermatome.  Reflexes: Unable to elicit BL LE d/t gaurding.   Assessment/Plan: 1. Functional deficits which require 3+ hours per day of interdisciplinary therapy in a comprehensive inpatient rehab setting. Physiatrist is providing close team supervision and 24 hour management of active medical problems listed below. Physiatrist and rehab team continue to assess barriers to discharge/monitor patient progress toward functional and medical goals  Care Tool:  Bathing    Body parts bathed by patient: Right arm, Left arm, Left upper leg, Chest, Abdomen, Front perineal area, Buttocks, Right upper leg, Face     Body parts n/a: Right lower leg, Left lower leg   Bathing assist Assist Level: Set up assist (bed level)     Upper Body Dressing/Undressing Upper body dressing   What is the patient wearing?: Pull over shirt    Upper body assist Assist Level: Set up assist Assistive Device Comment: bed level  Lower Body  Dressing/Undressing Lower body dressing    Lower body dressing activity did not occur: N/A What is the patient wearing?: Pants, Underwear/pull up     Lower body assist Assist for lower body dressing: Set up assist     Toileting Toileting    Toileting assist Assist for toileting: Independent with assistive device Assistive Device Comment: urinal   Transfers Chair/bed transfer  Transfers assist  Chair/bed transfer activity did not occur: Safety/medical concerns (unable to perform due to radiating back pain)        Locomotion Ambulation   Ambulation assist   Ambulation activity did not occur: Safety/medical concerns (unable to perform due to radiating back pain)          Walk 10 feet activity   Assist  Walk 10 feet activity did not occur: Safety/medical concerns (unable to perform due to radiating back pain)        Walk 50 feet activity   Assist Walk 50 feet with 2 turns activity did not occur: Safety/medical concerns (unable to   perform due to radiating back pain)         Walk 150 feet activity   Assist Walk 150 feet activity did not occur: Safety/medical concerns (unable to perform due to radiating back pain)         Walk 10 feet on uneven surface  activity   Assist Walk 10 feet on uneven surfaces activity did not occur: Safety/medical concerns (unable to perform due to radiating back pain)         Wheelchair     Assist Is the patient using a wheelchair?: No   Wheelchair activity did not occur: Safety/medical concerns (unable to perform due to radiating back pain)         Wheelchair 50 feet with 2 turns activity    Assist    Wheelchair 50 feet with 2 turns activity did not occur: Safety/medical concerns (unable to perform due to radiating back pain)       Wheelchair 150 feet activity     Assist  Wheelchair 150 feet activity did not occur: Safety/medical concerns (unable to perform due to radiating back pain)        Blood pressure (!) 148/66, pulse 75, temperature 97.9 F (36.6 C), temperature source Oral, resp. rate 16, height 5' 9" (1.753 m), weight 72.6 kg, SpO2 93 %.  Medical Problem List and Plan: 1. 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  Status post L2-3 disc aspiration per interventional radiology.  Blood cultures no growth to date.  Decadron 4 mg every 12 hours x2 days then 4 mg x 2 days and stop.             -patient may shower             -ELOS/Goals: 10-14 days  -Continue CIR 2.  Antithrombotics: -DVT/anticoagulation:  Mechanical: Antiembolism stockings, thigh (TED hose) Bilateral lower extremities             -antiplatelet therapy: none   3. Pain Management: Fentanyl patch/Duragesic patch, Neurontin 300 mg 3 times daily, Dilaudid 4 mg every 4 hours as needed pain, Robaxin as needed              - Zanaflex 4 mg TID instead of robaxin per pt preference              - Increase gabapentin to 600 mg TID for RLE neuropathic pain ; BUN/Cr stable 9/17              - Last dose Decadron 9/17              - Tried baclofen 10 mg; Dced d/t no effect              - Valium 2 mg Q6H PRN added, given relative spasm-free intervals following QHS clonazepam - wean as tolerated              - Dilaudid 1 mg IV given 1x for imaging; reinforced to patient no further IV. Note, some nursing concern to seeking behavior.              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 - will discuss options with NSGY given concern for discitis/osteomyelitis   4. Mood/Behavior/Sleep: Klonopin 0.25 mg nightly, Zoloft 50 mg daily             -  antipsychotic agents: N/A             - DC klonopin (home med) given PRN valium above; pt agreeable   5. Neuropsych/cognition: This patient is capable of making decisions on  his own behalf. 6. Skin/Wound Care: Routine skin checks 7. Fluids/Electrolytes/Nutrition: Routine in and outs with follow-up chemistries             - CMP stable 9/18 8.  ID/discitis/osteomyelitis.  Intravenous daptomycin and ceftriaxone through 12/25/2021 per infectious disease             - Disc cultures NGTD             - MRI lumbar spine 9/17 stable             - Weekly labs to ID, q2week ESR/CRP  -CRP 9/17 0.6 9.  Tremors.  Mysoline as directed 10.  Tobacco abuse.  Provide counseling, if cravings worsen can start patch 11.  GERD.  Protonix 40mg daily 12.  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  Follow-up outpatient      LOS: 2 days A FACE TO FACE EVALUATION WAS PERFORMED  Yuri Shtridelman 11/20/2021, 9:21 AM     

## 2021-11-21 DIAGNOSIS — M62838 Other muscle spasm: Secondary | ICD-10-CM

## 2021-11-21 DIAGNOSIS — K219 Gastro-esophageal reflux disease without esophagitis: Secondary | ICD-10-CM | POA: Diagnosis not present

## 2021-11-21 DIAGNOSIS — M79604 Pain in right leg: Secondary | ICD-10-CM | POA: Diagnosis not present

## 2021-11-21 DIAGNOSIS — M4646 Discitis, unspecified, lumbar region: Secondary | ICD-10-CM | POA: Diagnosis not present

## 2021-11-21 MED ORDER — TIZANIDINE HCL 4 MG PO TABS
4.0000 mg | ORAL_TABLET | Freq: Four times a day (QID) | ORAL | Status: DC
  Administered 2021-11-21 – 2021-11-25 (×15): 4 mg via ORAL
  Filled 2021-11-21 (×16): qty 1

## 2021-11-21 MED ORDER — GABAPENTIN 400 MG PO CAPS
800.0000 mg | ORAL_CAPSULE | Freq: Three times a day (TID) | ORAL | Status: DC
  Administered 2021-11-21 – 2021-11-24 (×9): 800 mg via ORAL
  Filled 2021-11-21 (×9): qty 2

## 2021-11-21 NOTE — Progress Notes (Signed)
Physical Therapy Session Note  Patient Details  Name: Cameron [name] MRN: [id number] Date of Birth: [birth year]  Today's Date: 11/21/2021 PT Individual Time: 1415-1530 PT Individual Time Calculation (min): 75 min   Short Term Goals: Week 1:  PT Short Term Goal 1 (Week 1): STG=LTG due to ELOS  Skilled Therapeutic Interventions/Progress Updates:    pt received in bed and agreeable to therapy. Pt reports unrated pain throughout session that spikes to intolerable levels with mobility. premedicated. Rest and positioning provided as needed. Session focused on gait training to tolerance. Pt performed supine<>sit with log roll technique and supervision with bed features several times throughout session. Pt stood to RW with CGA. Pt able to march in place with one instance of R knee buckling. Pt ambulated 6 x 3 x 15 foot laps of room. During 3rd lap pain levels consistently rose to intolerable levels. Pt did best when spending as little time in sitting as possible. Per pt report, pt with most difficulty in sitting and pressure through buttocks. Pt is able to attain hip flexion without increase in pain and demoes shortened hamstring preventing nerve tension testing. No flexion or extension bias noted, pain only seems to worsen in upright positions with sitting worse than standing. Pt required extended rest breaks to allow pain levels to return to baseline and allow for continued participation. Pt returned to bed after session and was left with needs in reach and his wife present.   Therapy Documentation Precautions:  Precautions Precautions: Fall, Back Precaution Comments: Back precautions for pain control (no formal order) Restrictions Weight Bearing Restrictions: No General:       Therapy/Group: Individual Therapy  Mickel Fuchs 11/21/2021, 2:35 PM

## 2021-11-21 NOTE — Patient Care Conference (Signed)
Inpatient RehabilitationTeam Conference and Plan of Care Update Date: 11/21/2021   Time: 11:05 AM    Patient Name: Cameron [name]      Medical Record Number: [id number]  Date of Birth: [birth year] Sex: Male         Room/Bed: 4W02C/4W02C-01 Payor Info: Payor: MEDICARE / Plan: MEDICARE PART A / Product Type: *No Product type* /    Admit Date/Time:  11/18/2021  2:46 PM  Primary Diagnosis:  Discitis  Hospital Problems: Principal Problem:   Discitis    Expected Discharge Date: Expected Discharge Date:  (pending due to pain management)  Team Members Present: Physician leading conference: Dr. Jennye Boroughs Social Worker Present: Loralee Pacas, [place] Nurse Present: Dorthula Nettles, RN PT Present: Ailene Rud, PT OT Present: Roanna Epley, COTA;Jennifer Tamala Julian, OT     Current Status/Progress Goal Weekly Team Focus  Bowel/Bladder   Continent of Bowel/bladder  Remain continent  Assist with toileting as needed   Swallow/Nutrition/ Hydration             ADL's   bathing at bed level-setup; LB dressing at bed level-assist for socks/shoes; toileting with bed pan-min A; unable to tolerate EOB or OOB activities 2/2 pain  mod I overall  OOB activity tolerance, transfers, BADL retraining, education   Mobility   Supervision with mobiltiy but extremely limited by pain, only able to sit EOB for <1 min  mod I overall  pain management   Communication             Safety/Cognition/ Behavioral Observations            Pain   Pain 10/10 RLE, PRN medications helping  Pain <3/10  Assess Qshift and prn   Skin   Skin intact, Picc line RUE  Remain intact.  Assess Qshift and prn     Discharge Planning:  Pt will d/c to home with his wife who is the primary caregiver. Pt on IV abx (dapto and ceftriaxone) until 10/23. [facility]   Team Discussion: Osteomyelitis/discitis. MD adjusting medications. Pain and spasms reported in legs. Dr. Tressa Busman spoke  with neuro concerning possible surgery. Dr. Curlene Dolphin to reach out again. Repeated MRI, no changes. PICC line in place, IV abx and additional six weeks. Continent B/B, LBM 9/18. Uncontrolled pain to lower back, right hip. Reports insomnia. Elbows red due to pushing up in bed, foams placed. Discharging home with spouse, has adult children that live local. Therapy to change therapy to 15/7 due to severe pain.  Patient on target to meet rehab goals: Mod I goals but unable to tolerate therapy due to uncontrolled pain.  *See Care Plan and progress notes for long and short-term goals.   Revisions to Treatment Plan:  Medication adjustments, 15/7 therapy schedule.   Teaching Needs: Family education, medication/pain management, skin/wound care, transfer/gait training, etc.   Current Barriers to Discharge: Decreased caregiver support, Wound care, and uncontrolled pain.  Possible Resolutions to Barriers: Family education Order recommended DME Follow up Home Health therapy Pain management     Medical Summary Current Status: pain/discitis/osteomyelitis L2-3, pain, GERD  Barriers to Discharge: Medical stability;Decreased family/caregiver support;Other (comments)  Barriers to Discharge Comments: pain/discitis/osteomyelitis L2-3,severe pain, GERD Possible Resolutions to [employer] Focus: Adjust pain medications, continue antibiotics   Continued Need for Acute Rehabilitation Level of Care: The patient requires daily medical management by a physician with specialized training in physical medicine and rehabilitation for the following reasons: Direction of a multidisciplinary physical rehabilitation program to maximize functional  independence : Yes Medical management of patient stability for increased activity during participation in an intensive rehabilitation regime.: Yes Analysis of laboratory values and/or radiology reports with any subsequent need for medication adjustment and/or medical  intervention. : Yes   I attest that I was present, lead the team conference, and concur with the assessment and plan of the team.   Cristi Loron 11/21/2021, 2:40 PM

## 2021-11-21 NOTE — IPOC Note (Signed)
Overall Plan of Care [facility]) Patient Details Name: MARKAVIOUS MICCO MRN: [id number] DOB: [birth year]  Admitting Diagnosis: Discitis  Hospital Problems: Principal Problem:   Discitis     Functional Problem List: Nursing Bladder, Bowel, Pain, Safety, Endurance, Sensory, Medication Management, Motor  PT Pain, Balance, Endurance, Sensory, Motor  OT Balance, Pain, Endurance  SLP    TR         Basic ADL's: OT Bathing, Dressing, Toileting     Advanced  ADL's: OT       Transfers: PT Bed Mobility, Bed to Chair, Car  OT Toilet, Tub/Shower     Locomotion: PT Ambulation     Additional Impairments: OT None  SLP        TR      Anticipated Outcomes Item Anticipated Outcome  Self Feeding n/a  Swallowing      Basic self-care  mod I  Toileting  mod I   Bathroom Transfers mod I  Bowel/Bladder  continent x 2  Transfers  Mod I  Locomotion  Mod I  Communication     Cognition     Pain  less than 5  Safety/Judgment  remain fall free while in rehab   Therapy Plan: PT Intensity: Minimum of 1-2 x/day ,45 to 90 minutes PT Frequency: 5 out of 7 days PT Duration Estimated Length of Stay: 7-10 days OT Intensity: Minimum of 1-2 x/day, 45 to 90 minutes OT Frequency: 5 out of 7 days OT Duration/Estimated Length of Stay: 5 days if he was not in severe pain     Team Interventions: Nursing Interventions Patient/Family Education, Disease Management/Prevention, Discharge Planning, Bladder Management, Bowel Management, Pain Management, Medication Management  PT interventions Ambulation/gait training, Discharge planning, DME/adaptive equipment instruction, Functional mobility training, Pain management, Psychosocial support, Therapeutic Activities, UE/LE Strength taining/ROM, Training and development officer, Community reintegration, Disease management/prevention, Neuromuscular re-education, Barrister's clerk education, IT trainer, Therapeutic Exercise, UE/LE Coordination activities   OT Interventions Training and development officer, Discharge planning, Pain management, Self Care/advanced ADL retraining, Therapeutic Activities, Functional mobility training, Patient/family education, [employer] reintegration, Engineer, drilling, Psychosocial support, UE/LE Strength taining/ROM  SLP Interventions    TR Interventions    SW/CM Interventions Discharge Planning, Psychosocial Support, Patient/Family Education   Barriers to Discharge MD  Medical stability, Home enviroment access/loayout, and pain  Nursing Inaccessible home environment, Decreased caregiver support, Home environment access/layout, IV antibiotics House is 2 level with 3 ste and rail on right; B/B on main level there is flight of ste in home  PT Home environment access/layout house, spouse 24/7, 2 level home with front entrance 3 steps to enter with R rails and back entrance across grass  4 steps to enter with bilateral rails  OT Other (comments) pain  SLP      SW Decreased caregiver support, Lack of/limited family support, IV antibiotics     Team Discharge Planning: Destination: PT-Home ,OT- Home , SLP-  Projected Follow-up: PT-Outpatient PT, OT-  Outpatient OT, SLP-  Projected Equipment Needs: PT-To be determined, OT- To be determined, SLP-  Equipment Details: PT-owns no DME, OT-  Patient/family involved in discharge planning: PT- Patient,  OT-Patient, SLP-   MD ELOS: 10-14  Medical Rehab Prognosis:  Good Assessment: The patient has been admitted for CIR therapies with the diagnosis of intractable back pain/discitis/osteomyelitis L2-3. The team will be addressing functional mobility, strength, stamina, balance, safety, adaptive techniques and equipment, self-care, bowel and bladder mgt, patient and caregiver education. Goals have been set at [facility] . Anticipated discharge destination is home.  See Team Conference Notes for weekly updates to the plan of care

## 2021-11-21 NOTE — Progress Notes (Signed)
Patient ID: Cameron [name], male   DOB: 11-Dec-1950, 71 y.o.   MRN: [id number]  SW met with pt and pt wife in room to provide updates from team conference on gains made in rehab, and how d/c date is pending addressing pain issues. Attending to follow-up with neurosurgery on possible solutions. Pt and wife are aware [facility] will follow for IV abx. SW will follow-up once d/c date confirmed.   *SW updated Pam Chandler/[facility] on above. Pt will have a co-pay of about $42 per day. She will follow-up with family to discuss in more detail.   [facility] accepted referral for HHPT/OT/SN.   Loralee Pacas, MSW, [place] Office: [phone] Cell: [phone] Fax: [phone]

## 2021-11-21 NOTE — Progress Notes (Signed)
PROGRESS NOTE   Subjective/Complaints:  Continue to report severe pain radiating into his legs.  Medications as helping but not keeping it under control.   LBM 9/18   ROS: Denies fevers, chills, N/V, abdominal pain, constipation, diarrhea, SOB, cough, chest pain, new weakness or paraesthesias.   + RLE pain  Objective:   MR Lumbar Spine W Wo Contrast  Result Date: 11/19/2021 CLINICAL DATA:  L2-L3 osteomyelitis discitis follow-up. EXAM: MRI LUMBAR SPINE WITHOUT AND WITH CONTRAST TECHNIQUE: Multiplanar and multiecho pulse sequences of the lumbar spine were obtained without and with intravenous contrast. CONTRAST:  7 mL view a intravenous contrast. COMPARISON:  MRI lumbar spine dated November 11, 2021. FINDINGS: Segmentation:  Standard. Alignment: Unchanged dextroscoliosis similar trace retrolisthesis at L1-L2 and L2-L3. Vertebrae: Degree of fluid within the L2-L3 disc space has significantly decreased. Mild surrounding endplate enhancement is unchanged. No progressive findings. Similar degenerative endplate marrow edema at L4-L5. Conus medullaris and cauda equina: Conus extends to the L1 level. Conus and cauda equina appear normal. No abnormal intrathecal enhancement. Paraspinal and other soft tissues: Negative. No paravertebral inflammatory change. Disc levels: T12-L1:  Negative. L1-L2: Unchanged mild-to-moderate left eccentric disc bulging. No stenosis. L2-L3: Unchanged mild-to-moderate right eccentric disc bulging and endplate spurring with asymmetric right facet arthropathy and ligamentum flavum hypertrophy. Unchanged moderate spinal canal and right lateral recess stenosis. Unchanged moderate to severe right and mild left neuroforaminal stenosis. L3-L4: Unchanged mild disc bulging with superimposed right foraminal and far lateral disc protrusion displacing the exiting right L3 nerve root. Unchanged mild-to-moderate bilateral facet  arthropathy. Unchanged mild bilateral neuroforaminal stenosis. No spinal canal stenosis. L4-L5: Unchanged moderate disc bulging with left far lateral disc osteophyte complex. Unchanged moderate left and mild right facet arthropathy. Unchanged moderate spinal canal and bilateral lateral recess stenosis. Unchanged moderate to severe left and mild right neuroforaminal stenosis. L5-S1: Unchanged small broad-based posterior disc protrusion. No stenosis. IMPRESSION: 1. Decreased fluid within the L2-L3 disc space with unchanged mild surrounding endplate irregularity/enhancement. No progressive findings. This remains indeterminate for osteomyelitis-discitis versus degenerative change. Correlate with disc aspiration cultures. 2. Unchanged multilevel lumbar spondylosis as described above. Unchanged moderate spinal canal, right lateral recess, and moderate to severe right neuroforaminal stenosis at L2-L3. 3. Unchanged right foraminal and far lateral disc protrusion at L3-L4 displacing the exiting right L3 nerve root. 4. Unchanged moderate spinal canal and bilateral lateral recess stenosis at L4-L5 with moderate to severe left neuroforaminal stenosis. Electronically Signed   By: Titus Dubin M.D.   On: 11/19/2021 13:11   Recent Labs    11/19/21 0333 11/20/21 0430  WBC 6.9 7.3  HGB 13.4 13.7  HCT 40.2 40.0  PLT 192 202    Recent Labs    11/19/21 0333 11/20/21 0430  NA 138 139  K 3.6 4.0  CL 98 100  CO2 30 28  GLUCOSE 95 104*  BUN 12 10  CREATININE 0.90 0.94  CALCIUM 9.0 9.1     Intake/Output Summary (Last 24 hours) at 11/21/2021 1111 Last data filed at 11/21/2021 0523 Gross per 24 hour  Intake 240 ml  Output 500 ml  Net -260 ml         Physical  Exam: Vital Signs Blood pressure 124/86, pulse 72, temperature 97.9 F (36.6 C), temperature source Oral, resp. rate 16, height 5' 9"  (1.753 m), weight 72.6 kg, SpO2 97 %.  Constitutional: Distressed, bracing against bed, intermittent jerks in  his legs and back HENT: No JVD. Neck Supple. Trachea midline. Atraumatic, normocephalic. Eyes: PERRLA. EOMI. Visual fields grossly intact.  Cardiovascular: RRR, no murmurs/rub/gallops. No Edema. Peripheral pulses 2+  Respiratory: CTAB. No rales, rhonchi, or wheezing. On RA.  Abdomen: + bowel sounds, normoactive. ND, NT Skin: C/D/I. No apparent lesions. Back biopsy area healed.  MSK:      No apparent deformity.      Strength:                RUE: 5/5 SA, 5/5 EF, 5/5 EE, 5/5 WE, 5/5 FF, 5/5 FA                 LUE: 5/5 SA, 5/5 EF, 5/5 EE, 5/5 WE, 5/5 FF, 5/5 FA                 RLE: 2/5 HF, 3/5 KE, 4/5 DF, 1/5 EHL (congenital), 4/5 PF  - limited by pain                 LLE:  5/5 HF, 5/5 KE, 5/5 DF, 5/5 EHL, 5/5 PF    Neurologic exam: follows commands Cognition: AAO to person, place, time and event.  Sensation: Altered/sensitive to light touch over medial right knee, wrapping to R hip and back following L3 dermatome.  Allodynia in RLE Reflexes: Unable to elicit BL LE d/t gaurding.   Assessment/Plan: 1. Functional deficits which require 3+ hours per day of interdisciplinary therapy in a comprehensive inpatient rehab setting. Physiatrist is providing close team supervision and 24 hour management of active medical problems listed below. Physiatrist and rehab team continue to assess barriers to discharge/monitor patient progress toward functional and medical goals  Care Tool:  Bathing    Body parts bathed by patient: Right arm, Left arm, Left upper leg, Chest, Abdomen, Front perineal area, Buttocks, Right upper leg, Face, Right lower leg, Left lower leg     Body parts n/a: Right lower leg, Left lower leg   Bathing assist Assist Level: Set up assist (bed level)     Upper Body Dressing/Undressing Upper body dressing   What is the patient wearing?: Pull over shirt    Upper body assist Assist Level: Set up assist Assistive Device Comment: bed level  Lower Body Dressing/Undressing Lower  body dressing    Lower body dressing activity did not occur: N/A What is the patient wearing?: Pants, Underwear/pull up     Lower body assist Assist for lower body dressing: Set up assist (bed level)     Toileting Toileting    Toileting assist Assist for toileting: Independent with assistive device Assistive Device Comment: urinal   Transfers Chair/bed transfer  Transfers assist  Chair/bed transfer activity did not occur: Safety/medical concerns (unable to perform due to radiating back pain)        Locomotion Ambulation   Ambulation assist   Ambulation activity did not occur: Safety/medical concerns (unable to perform due to radiating back pain)          Walk 10 feet activity   Assist  Walk 10 feet activity did not occur: Safety/medical concerns (unable to perform due to radiating back pain)        Walk 50 feet activity   Assist Walk 50 feet with  2 turns activity did not occur: Safety/medical concerns (unable to perform due to radiating back pain)         Walk 150 feet activity   Assist Walk 150 feet activity did not occur: Safety/medical concerns (unable to perform due to radiating back pain)         Walk 10 feet on uneven surface  activity   Assist Walk 10 feet on uneven surfaces activity did not occur: Safety/medical concerns (unable to perform due to radiating back pain)         Wheelchair     Assist Is the patient using a wheelchair?: No   Wheelchair activity did not occur: Safety/medical concerns (unable to perform due to radiating back pain)         Wheelchair 50 feet with 2 turns activity    Assist    Wheelchair 50 feet with 2 turns activity did not occur: Safety/medical concerns (unable to perform due to radiating back pain)       Wheelchair 150 feet activity     Assist  Wheelchair 150 feet activity did not occur: Safety/medical concerns (unable to perform due to radiating back pain)       Blood  pressure 124/86, pulse 72, temperature 97.9 F (36.6 C), temperature source Oral, resp. rate 16, height 5' 9"  (1.753 m), weight 72.6 kg, SpO2 97 %.  Medical Problem List and Plan: 1. 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  Status post L2-3 disc aspiration per interventional radiology.  Blood cultures no growth to date.  Decadron 4 mg every 12 hours x2 days then 4 mg x 2 days and stop.             -patient may shower             -ELOS/Goals: 10-14 days  -Continue CIR  -15/7  -Team conference today 2.  Antithrombotics: -DVT/anticoagulation:  Mechanical: Antiembolism stockings, thigh (TED hose) Bilateral lower extremities             -antiplatelet therapy: none   3. Pain Management: Fentanyl patch/Duragesic patch, Neurontin 300 mg 3 times daily, Dilaudid 4 mg every 4 hours as needed pain, Robaxin as needed              - Zanaflex 4 mg TID instead of robaxin per pt preference              - Increase gabapentin to 600 mg TID for RLE neuropathic pain ; BUN/Cr stable 9/17              - Last dose Decadron 9/17              - Tried baclofen 10 mg; Dced d/t no effect              - Valium 2 mg Q6H PRN added, given relative spasm-free intervals following QHS clonazepam - wean as tolerated              - Dilaudid 1 mg IV given 1x for imaging; reinforced to patient no further IV. Note, some nursing concern to seeking behavior.              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 - Concern to complete ESI given possible discitis/osteomyelitis  -9/19  increase gabapentin to 846m TID,  continue fentanyl 12 patch, increase zanaflex to 455mQID for muscle spasms 4. Mood/Behavior/Sleep: Klonopin 0.25 mg nightly, Zoloft 50 mg daily             -antipsychotic agents: N/A             - DC klonopin  (home med) given PRN valium above; pt agreeable 5. Neuropsych/cognition: This patient is capable of making decisions on his own behalf. 6. Skin/Wound Care: Routine skin checks 7. Fluids/Electrolytes/Nutrition: Routine in and outs with follow-up chemistries             - CMP stable 9/18 8.  ID/discitis/osteomyelitis.  Intravenous daptomycin and ceftriaxone through 12/25/2021 per infectious disease             - Disc cultures NGTD             - MRI lumbar spine 9/17 stable             - Weekly labs to ID, q2week ESR/CRP  -CRP 9/17 0.6 9.  Tremors.  Mysoline as directed 10.  Tobacco abuse.  Provide counseling, if cravings worsen can start patch 11.  GERD.  Protonix 4034maily 12.  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  Follow-up outpatient 13. Low albumin. mild 3.4, encourage protein intake      LOS: 3 days A FACE TO FACE EVALUATION WAS PERFORMED  YurJennye Boroughs19/2023, 11:11 AM

## 2021-11-21 NOTE — Progress Notes (Signed)
Physical Therapy Note  Patient Details  Name: Cameron [name] MRN: [id number] Date of Birth: [birth year] Today's Date: 11/21/2021    Pt's plan of care adjusted to 15/7 after speaking with care team and discussed with MD in team conference as pt currently unable to tolerate current therapy schedule with OT, PT, and SLP.     Mickel Fuchs 11/21/2021, 12:56 PM

## 2021-11-21 NOTE — Progress Notes (Signed)
Physical Therapy Session Note  Patient Details  Name: Cameron [name] MRN: [id number] Date of Birth: 08/29/1950  Today's Date: 11/21/2021 PT Individual Time: 1131-1202 PT Individual Time Calculation (min): 31 min   Short Term Goals: Week 1:  PT Short Term Goal 1 (Week 1): STG=LTG due to ELOS  Skilled Therapeutic Interventions/Progress Updates:    pt received in bed and agreeable to therapy. Pt reports pain at manageable levels in supine but spiking with mobility. premedicated. Rest and positioning provided as needed. Pt requesting to use bed pan. Pt able to bridge to place bedpan and perform all bed mobility required for hygiene and clothing management. After this, attempted to position bed in chair position. Pt able to achieve near full position for several minutes but was unable to tolerate resting in this position after session d/t increase in pain. Pt remained in bed after session with his wife present and CSW entering room for conference update.   Therapy Documentation Precautions:  Precautions Precautions: Fall, Back Precaution Comments: Back precautions for pain control (no formal order) Restrictions Weight Bearing Restrictions: No General:       Therapy/Group: Individual Therapy  Mickel Fuchs 11/21/2021, 12:37 PM

## 2021-11-21 NOTE — Progress Notes (Signed)
Occupational Therapy Session Note  Patient Details  Name: Cameron [name] MRN: [id number] Date of Birth: 12/01/1950  Today's Date: 11/21/2021 OT Individual Time: 9774-1423 OT Individual Time Calculation (min): 55 min    Short Term Goals: Week 1:  OT Short Term Goal 1 (Week 1): LTG=STG  Skilled Therapeutic Interventions/Progress Updates:    Pt resting in bed upon arrival. Intermittent pain throughout session. Pt completed all BADLs at bed level with setup/supervision. Pt able to reposition in bed with BUE/BLE. BLE therex at bed level. Pt required multiple rest breaks 2/2 pain. Ongoing discharge planning. Pt employed breathing strategies for pain mgmt. Pt remained in bed with all needs within reach.   Therapy Documentation Precautions:  Precautions Precautions: Fall, Back Precaution Comments: Back precautions for pain control (no formal order) Restrictions Weight Bearing Restrictions: No  Pain: Pain Assessment Pain Scale: 0-10 Pain Score: 9  Pain Location: Hip Pain Orientation: Right Pain Descriptors / Indicators: Shooting;Aching Pain Frequency: Constant   Therapy/Group: Individual Therapy  Leroy Libman 11/21/2021, 9:56 AM

## 2021-11-21 NOTE — Progress Notes (Signed)
Occupational Therapy Session Note  Patient Details  Name: JAVIEL CANEPA MRN: [id number] Date of Birth: 15-Jun-1950  Today's Date: 11/21/2021 OT Individual Time: 9678-9381 OT Individual Time Calculation (min): 52 min    Short Term Goals: Week 1:  OT Short Term Goal 1 (Week 1): LTG=STG  Skilled Therapeutic Interventions/Progress Updates:  Pt awake in bed upon OT arrival to the room. Pt reports, "It just hurts and it's like these spasms." Pt in agreement for OT session.   Therapy Documentation Precautions:  Precautions Precautions: Fall, Back Precaution Comments: Back precautions for pain control (no formal order) Restrictions Weight Bearing Restrictions: No Vital Signs: Please see "Flowsheet" for most recent vitals charted by nursing staff.  Pain: Pain Assessment Pain Scale: 0-10 Pain Score: 7  Pain Location: Back Pain Orientation: Mid Pain Descriptors / Indicators: Shooting Pain Onset: On-going Pain Intervention(s): Distraction;Emotional support Multiple Pain Sites: No  ADL: Pt declines need to perform ADLs at this time.  Functional Mobility: Pt participates in therapeutic activity in order to improve independence with functional mobility. OT provides education on log roll and spinal protection techniques. OT provides skilled demo on log roll techniques and recommends pt perform supine <> sit transfers using this technique to decrease strain on spine. Pt able to perform log roll for supine > sit with R side exit out of bed and supervision. Pt then tolerates sitting with no increase in pain. Pt in agreement to attempt standing and able to perform sit <> stand from EOB with CGA for safety. Pt able to maintain standing balance with CGA with use of FWW for ~1 minute prior to report of increased pain and needing to return to sitting. Pt educated on technique to perform log roll to transition to supine, however, due to high pain levels pt returned to supine using a different  technique.   Pain Management: OT provides education and VC's during functional mobility to aid in pain management. OT provides education to pt on pursed-lip breathing techniques and how this relaxes the nervous system and assists with calming in order to decrease pain. Pt requires moderate VC's to utilize pursed-lip breathing techniques with mobility. OT provides education on how remaining in bed can increase muscular tightness and increase pain levels. OT reinforced the importance of movement to aid in pain management as well. Pt reports increased muscular tightness in low back and in agreement to trial heat packs to low back. OT wraps heat packs in pillow care to maintain skin integrity and provided assistance to place. Pt reports comfort with heat pack and OT provided education to keep heat in place for ~15-20 minutes and remove prior to PT. Pt and spouse verbalize understanding. OT provides education on how to doff/don and tighten LSO brace. OT educated pt and spouse on wearing instructions "for comfort" and recommended pt trial brace with PT and attempt mobility to determine if brace assists with main management. Pt and pt spouse verbalize understanding.   Pt returned to bed at end of session. Pt left resting comfortably in bed with personal belongings and call light within reach, bed alarm on and activated, bed in low position, 3 bed rails up, spouse present in room, and comfort needs attended to.   Therapy/Group: Individual Therapy  Barbee Shropshire 11/21/2021, 4:22 PM

## 2021-11-22 DIAGNOSIS — M4640 Discitis, unspecified, site unspecified: Secondary | ICD-10-CM | POA: Diagnosis not present

## 2021-11-22 DIAGNOSIS — F411 Generalized anxiety disorder: Secondary | ICD-10-CM | POA: Diagnosis not present

## 2021-11-22 DIAGNOSIS — M62838 Other muscle spasm: Secondary | ICD-10-CM | POA: Diagnosis not present

## 2021-11-22 DIAGNOSIS — M4646 Discitis, unspecified, lumbar region: Secondary | ICD-10-CM | POA: Diagnosis not present

## 2021-11-22 DIAGNOSIS — M79604 Pain in right leg: Secondary | ICD-10-CM | POA: Diagnosis not present

## 2021-11-22 LAB — GLUCOSE, CAPILLARY: Glucose-Capillary: 94 mg/dL (ref 70–99)

## 2021-11-22 MED ORDER — FENTANYL 25 MCG/HR TD PT72
1.0000 | MEDICATED_PATCH | TRANSDERMAL | Status: DC
  Administered 2021-11-22 – 2021-11-23 (×2): 1 via TRANSDERMAL
  Filled 2021-11-22 (×2): qty 1

## 2021-11-22 MED ORDER — HYDROMORPHONE HCL 2 MG PO TABS
2.0000 mg | ORAL_TABLET | Freq: Once | ORAL | Status: AC
  Administered 2021-11-22: 2 mg via ORAL
  Filled 2021-11-22: qty 1

## 2021-11-22 NOTE — Progress Notes (Signed)
Pt received PRN Dilaudid, PRN Valium, both ineffective, continues to be in 10/10 pain with whole body jerking. PA Lauraine Rinne notified, new order received and implemented.

## 2021-11-22 NOTE — Progress Notes (Signed)
Occupational Therapy Session Note  Patient Details  Name: Cameron [name] MRN: [id number] Date of Birth: [birth year]  Today's Date: 11/22/2021 OT Individual Time: 9407-6808 OT Individual Time Calculation (min): 45 min  and Today's Date: 11/22/2021 OT Missed Time: 30 Minutes Missed Time Reason: Pain   Short Term Goals: Week 1:  OT Short Term Goal 1 (Week 1): LTG=STG  Skilled Therapeutic Interventions/Progress Updates:    OT intervention with focus on bed mobility, sitting balance, functional amb with RW, sit<>stand, and activity tolerance to increase independence with BADLs. Pt with obvious spasms when resting in bed. Pt donned pants at bed level. Sit<>stand and amb with RW in room with CGA. Pt amb to doorway and into bathroom. Returned to sitting EOB. Sit>supine with supervision. After rest, pt amb with RW to bathroom and sat on std toilet. Sit>stand from toilet increased pain in RLE/hip with pt grimacing. Pt returned to bed. Recommended BSC for increase height of home toilet. Pt resting in bed. Pt missed 30 mins skilled OT services 2/2 increased pain. Bed alarm activated.   Therapy Documentation Precautions:  Precautions Precautions: Fall, Back Precaution Comments: Back precautions for pain control (no formal order) Restrictions Weight Bearing Restrictions: No General: General OT Amount of Missed Time: 30 Minutes Vital Signs:   Pain: Pain Assessment Pain Score: initially 8 but increased with activity   Therapy/Group: Individual Therapy  Leroy Libman 11/22/2021, 10:11 AM

## 2021-11-22 NOTE — Progress Notes (Signed)
Physical Therapy Session Note  Patient Details  Name: Cameron [name] MRN: [id number] Date of Birth: 1951/02/24  Today's Date: 11/22/2021 PT Individual Time: 1315-1408 PT Individual Time Calculation (min): 53 min   Short Term Goals: Week 1:  PT Short Term Goal 1 (Week 1): STG=LTG due to ELOS  Skilled Therapeutic Interventions/Progress Updates: Pt presented in bed on phone with wife. Pt requesting a few minutes to complete conversation. PTA returned a few minutes later with pt off phone and agreeable to therapy. Pt with intermittent spasms throughout session with pt unable to rate but did state was better than am. Session focused on general mobility and instruction in low back stretching. Pt instructed in performing SKC 30 sec x 3 which pt was able to perform initially with CGA then progressed to supervision. Pt provided education in purpose in performing back stretches due to current decreased mobility and significant guarding. Pt's nurse then arrived to set up IV. During that time PTA noted pt's LSO present and pt agreeable to trial. LSO set at XL with pt shrinking to L. Pt performed bed mobility with supervision while PTA donned LSO. LSO noted to be loose as L upon pt standing. Pt performed standing marches x 10 with RW with supervision. Pt with increased pain requesting to sit, LSO doffed in sitting and pt returned to supine for pain management. PTA adjusted brace again to M with pt returning to sitting as prior and noted improved fit. Pt then ambulated ~24f in room with CGA but unable to ambulate further due to pain. Pt returned to bed at end of session and left with bed alarm on, call bell within reach and needs met.      Therapy Documentation Precautions:  Precautions Precautions: Fall, Back Precaution Comments: Back precautions for pain control (no formal order) Restrictions Weight Bearing Restrictions: No General:   Vital Signs:   Pain: Pain Assessment Pain Score: 6   Mobility:   Locomotion :    Trunk/Postural Assessment :    Balance:   Exercises:   Other Treatments:      Therapy/Group: Individual Therapy  Lizzete Gough 11/22/2021, 2:33 PM

## 2021-11-22 NOTE — Progress Notes (Signed)
Orthopedic Tech Progress Note Patient Details:  Cameron [name] Apr 03, 1950 [id number] LSO Brace was delivered to this patient's room.  Ortho Devices Type of Ortho Device: Lumbar corsett Ortho Device/Splint Location: Back Ortho Device/Splint Interventions: Ordered      Raelin Pixler E Hetvi Shawhan 11/22/2021, 10:55 AM

## 2021-11-22 NOTE — Progress Notes (Signed)
PROGRESS NOTE   Subjective/Complaints:  Continues to have severe leg pain. He was given additional dose dilaudid this AM.   LBM 9/18   ROS: Denies fevers, chills, N/V, abdominal pain, constipation, diarrhea, SOB, cough, chest pain, new weakness or paraesthesias.   + RLE pain  Objective:   No results found. Recent Labs    11/20/21 0430  WBC 7.3  HGB 13.7  HCT 40.0  PLT 202    Recent Labs    11/20/21 0430  NA 139  K 4.0  CL 100  CO2 28  GLUCOSE 104*  BUN 10  CREATININE 0.94  CALCIUM 9.1     Intake/Output Summary (Last 24 hours) at 11/22/2021 0803 Last data filed at 11/22/2021 0655 Gross per 24 hour  Intake 330 ml  Output 1625 ml  Net -1295 ml         Physical Exam: Vital Signs Blood pressure 124/64, pulse 77, temperature 98.3 F (36.8 C), resp. rate 16, height 5' 9"  (1.753 m), weight 72.6 kg, SpO2 94 %.  Constitutional: NAD,  intermittent jerks in his legs and back appears uncomfortable HENT: No JVD. Neck Supple. Trachea midline. Atraumatic, normocephalic. Eyes: PERRLA. EOMI. Visual fields grossly intact.  Cardiovascular: RRR, no murmurs/rub/gallops. No Edema. Peripheral pulses 2+  Respiratory: CTAB. No rales, rhonchi, or wheezing. On RA.  Abdomen: + bowel sounds, normoactive. ND, NT Skin: C/D/I. No apparent lesions. Back biopsy area healed.  MSK:      No apparent deformity.      Strength:                RUE: 5/5 SA, 5/5 EF, 5/5 EE, 5/5 WE, 5/5 FF, 5/5 FA                 LUE: 5/5 SA, 5/5 EF, 5/5 EE, 5/5 WE, 5/5 FF, 5/5 FA                 RLE: 2/5 HF, 3/5 KE, 4/5 DF, 1/5 EHL (congenital), 4/5 PF  - limited by pain                 LLE:  5/5 HF, 5/5 KE, 5/5 DF, 5/5 EHL, 5/5 PF    Neurologic exam: follows commands Cognition: AAO to person, place, time and event.  Sensation: Altered/sensitive to light touch over medial right knee, wrapping to R hip and back following L3 dermatome.  Allodynia in  RLE Reflexes: Unable to elicit BL LE d/t gaurding.   Assessment/Plan: 1. Functional deficits which require 3+ hours per day of interdisciplinary therapy in a comprehensive inpatient rehab setting. Physiatrist is providing close team supervision and 24 hour management of active medical problems listed below. Physiatrist and rehab team continue to assess barriers to discharge/monitor patient progress toward functional and medical goals  Care Tool:  Bathing    Body parts bathed by patient: Right arm, Left arm, Left upper leg, Chest, Abdomen, Front perineal area, Buttocks, Right upper leg, Face, Right lower leg, Left lower leg     Body parts n/a: Right lower leg, Left lower leg   Bathing assist Assist Level: Set up assist (bed level)     Upper Body  Dressing/Undressing Upper body dressing   What is the patient wearing?: Pull over shirt    Upper body assist Assist Level: Set up assist Assistive Device Comment: bed level  Lower Body Dressing/Undressing Lower body dressing      What is the patient wearing?: Pants, Underwear/pull up     Lower body assist Assist for lower body dressing: Set up assist (bed level)     Toileting Toileting    Toileting assist Assist for toileting: Independent with assistive device Assistive Device Comment: urinal   Transfers Chair/bed transfer  Transfers assist  Chair/bed transfer activity did not occur: Safety/medical concerns (unable to perform due to radiating back pain)        Locomotion Ambulation   Ambulation assist   Ambulation activity did not occur: Safety/medical concerns (unable to perform due to radiating back pain)          Walk 10 feet activity   Assist  Walk 10 feet activity did not occur: Safety/medical concerns (unable to perform due to radiating back pain)        Walk 50 feet activity   Assist Walk 50 feet with 2 turns activity did not occur: Safety/medical concerns (unable to perform due to radiating  back pain)         Walk 150 feet activity   Assist Walk 150 feet activity did not occur: Safety/medical concerns (unable to perform due to radiating back pain)         Walk 10 feet on uneven surface  activity   Assist Walk 10 feet on uneven surfaces activity did not occur: Safety/medical concerns (unable to perform due to radiating back pain)         Wheelchair     Assist Is the patient using a wheelchair?: No   Wheelchair activity did not occur: Safety/medical concerns (unable to perform due to radiating back pain)         Wheelchair 50 feet with 2 turns activity    Assist    Wheelchair 50 feet with 2 turns activity did not occur: Safety/medical concerns (unable to perform due to radiating back pain)       Wheelchair 150 feet activity     Assist  Wheelchair 150 feet activity did not occur: Safety/medical concerns (unable to perform due to radiating back pain)       Blood pressure 124/64, pulse 77, temperature 98.3 F (36.8 C), resp. rate 16, height 5' 9"  (1.753 m), weight 72.6 kg, SpO2 94 %.  Medical Problem List and Plan: 1. 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  Status post L2-3 disc aspiration per interventional radiology.  Blood cultures no growth to date.  Decadron 4 mg every 12 hours x2 days then 4 mg x 2 days and stop.             -patient may shower             -ELOS/Goals: 10-14 days  -Continue CIR  -15/7  -Team conference today 2.  Antithrombotics: -DVT/anticoagulation:  Mechanical: Antiembolism stockings, thigh (TED hose) Bilateral lower extremities             -antiplatelet therapy: none   3. Pain Management: Fentanyl patch/Duragesic patch, Neurontin 300 mg 3 times daily, Dilaudid 4 mg every 4 hours as needed pain, Robaxin as needed              - Zanaflex 4 mg TID instead of robaxin per pt preference              -  Increase gabapentin to 600 mg TID for RLE neuropathic pain ; BUN/Cr  stable 9/17              - Last dose Decadron 9/17              - Tried baclofen 10 mg; Dced d/t no effect              - Valium 2 mg Q6H PRN added, given relative spasm-free intervals following QHS clonazepam - wean as tolerated              - Dilaudid 1 mg IV given 1x for imaging; reinforced to patient no further IV. Note, some nursing concern to seeking behavior.              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 Mood/Behavior/Sleep: Klonopin 0.25 mg nightly, Zoloft 50 mg daily             -antipsychotic agents: N/A             - DC klonopin (home med) given PRN valium above; pt agreeable  -Continue valium 2mg70mH PRN 5. Neuropsych/cognition: This patient is capable of making decisions on his own behalf. 6. Skin/Wound Care: Routine skin checks 7. Fluids/Electrolytes/Nutrition: Routine in and outs with follow-up chemistries             - CMP stable 9/18 8.  ID/discitis/osteomyelitis.  Intravenous daptomycin and ceftriaxone through 12/25/2021 per infectious disease             - Disc cultures NGTD             - MRI lumbar spine 9/17 stable             - Weekly labs to ID, q2week ESR/CRP  -CRP 9/17 0.6 9.  Tremors.  Mysoline as directed 10.  Tobacco abuse.  Provide counseling 11.  GERD.  Protonix 40mg43mly 12.  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  Follow-up outpatient 13. Low albumin. mild 3.4, encourage protein intake  -Decreased oral intake yesterday, if continues will add ensure      LOS: 4 days A FACE TO FACE EVALUATION  WAS PERFORMED  Dereon Corkery Jennye Boroughs/2023, 8:03 AM

## 2021-11-23 DIAGNOSIS — K5903 Drug induced constipation: Secondary | ICD-10-CM

## 2021-11-23 DIAGNOSIS — R770 Abnormality of albumin: Secondary | ICD-10-CM

## 2021-11-23 DIAGNOSIS — M4646 Discitis, unspecified, lumbar region: Secondary | ICD-10-CM | POA: Diagnosis not present

## 2021-11-23 DIAGNOSIS — M62838 Other muscle spasm: Secondary | ICD-10-CM | POA: Diagnosis not present

## 2021-11-23 DIAGNOSIS — M79604 Pain in right leg: Secondary | ICD-10-CM | POA: Diagnosis not present

## 2021-11-23 MED ORDER — ENSURE ENLIVE PO LIQD
237.0000 mL | Freq: Two times a day (BID) | ORAL | Status: DC
  Administered 2021-11-23 – 2021-12-03 (×13): 237 mL via ORAL

## 2021-11-23 MED ORDER — POLYETHYLENE GLYCOL 3350 17 G PO PACK
17.0000 g | PACK | Freq: Two times a day (BID) | ORAL | Status: DC
  Administered 2021-11-25 – 2021-12-03 (×7): 17 g via ORAL
  Filled 2021-11-23 (×24): qty 1

## 2021-11-23 NOTE — Progress Notes (Signed)
Occupational Therapy Session Note  Patient Details  Name: KENT RIENDEAU MRN: [id number] Date of Birth: Aug 19, 1950  Today's Date: 11/23/2021 OT Individual Time: 0930-1040 OT Individual Time Calculation (min): 70 min    Short Term Goals: Week 1:  OT Short Term Goal 1 (Week 1): LTG=STG  Skilled Therapeutic Interventions/Progress Updates:    Pt resting in bed upon arrival. OT intervention with focus on sitting balance EOB and functional amb with RW. Pt provided with padded TTB to use for shower planned for 9/22. Pt sat on TTB in room and commented that it is more comfortable/less painful then regular bench/BSC. Amb with RW 224'x2 with CGA and standing rest breakx1. Pt sat EOB x8 min to complete grooming tasks. Significant rest breaks following ambulation 2/2 increase in pain. Pt pleased with progress. Pt remained in bed with all needs within reach and bed alarm activated.   Therapy Documentation Precautions:  Precautions Precautions: Fall, Back Precaution Comments: Back precautions for pain control (no formal order) Restrictions Weight Bearing Restrictions: No   Pain:  Pt reports 9/10 pain in Rt hip/RLE; repositioned   Therapy/Group: Individual Therapy  Leroy Libman 11/23/2021, 10:44 AM

## 2021-11-23 NOTE — Consult Note (Signed)
Neuropsychological Consultation   Patient:   Cameron [name]   DOB:   12/18/1950  MR Number:  [id number]  Location:  [facility] [place] [facility] [id number] [place] [address] Dept: [place]: [phone]           Date of Service:   11/22/2021  Start Time:   4 PM End Time:   5 PM  Provider/Observer:  Ilean Skill, Psy.D.       Clinical Neuropsychologist       Billing Code/Service: 62952  Chief Complaint:    Cameron [name] is a 71 year old male referred for neuropsychological consultation due to coping and adjustment issues during his CIR treatments.  Patient has a past medical history including testicular cancer, radiation therapy, BPH, anxiety, essential tremor.  Patient had developed severe back pain and presented to ED on 11/11/2021 and ultimately transferred to [facility].  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  Patient now on IV antibiotics for 6 weeks.  Reason for Service:  Patient referred for neuropsychological consultation due to coping and adjustment issues.  Below is the HPI for the current admission.  HPI: Cameron [name] is a 71 year old right-handed male with history of testicular cancer status postresection/orchiectomy 2005 and radiation therapy/BPH/anxiety/essential tremor maintained on Mysoline, tobacco use.  Per chart review patient lives with spouse.  Independent prior to admission.  Presented to [facility] 11/11/2021 with severe back pain that he experienced after splitting and stacking wood that slowly progressed.  He initially did see the orthopedic clinic started on steroid and muscle relaxer.  Unfortunately pain continued to worsen to the point he could not ambulate with radiating pain to the right lower extremity.  MRI lumbar spine showed fluid in the L2-3 disc space with some enhancement within  the disc space and mild endplate enhancement.  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  Moderate to severe right and moderate left foraminal stenosis at L2-3.  Moderate left and mild right foraminal stenosis L3-4 as well as moderate to severe left subarticular and foraminal stenosis L4-5 and narrowing L4-5.  Admission chemistries unremarkable, sedimentation rate of 1, urinalysis negative.  Blood cultures no growth to date.  Underwent L2-3 disc aspiration per interventional radiology.  Aspiration culture currently with no growth so far.  Infectious disease Dr.Manandhar and placed on intravenous daptomycin and ceftriaxone x6 weeks ending 12/25/2021.  He is completing a Decadron taper.  Therapy evaluations completed due to patient decreased functional mobility was admitted for a comprehensive rehab program  Current Status:  Patient was awake and alert although some dysarthric speech was noted.  Patient described the development of his pain first thinking that it was just a strain in his back but it progressively working to the point that he could not ambulate and an ambulance was called to transport him to the hospital.  He was initially transported to [facility] and then over to [facility] for care.  The patient reports that he had always been very active and has been doing a lot of work around his home and initially simply thought he had pulled a muscle.  He reports that he understands what is happened to him medically and feels like he has been told clearly what has happened and what to expect.  Patient is having some degree of stress with extended hospital stay and severity and risk of current condition and still  experiencing motor weakness and coordination issues.  The patient is highly motivated to continue with therapeutic interventions.  Behavioral Observation: [name]  presents as a 71 y.o.-year-old Right handed Caucasian Male who appeared his stated age. his dress was Appropriate and he was  Well Groomed and his manners were Appropriate to the situation.  his participation was indicative of Appropriate and Redirectable behaviors.  There were physical disabilities noted.  he displayed an appropriate level of cooperation and motivation.     Interactions:    Active Appropriate  Attention:   within normal limits and attention span and concentration were age appropriate  Memory:   within normal limits; recent and remote memory intact  Visuo-spatial:  not examined  Speech (Volume):  normal  Speech:   slurred; some dysarthria noted  Thought Process:  Coherent and Relevant  Though Content:  WNL; not suicidal and not homicidal  Orientation:   person, place, time/date, and situation  Judgment:   Good  Planning:   Fair  Affect:    Anxious  Mood:    Anxious  Insight:   Good  Intelligence:   normal   Medical History:   Past Medical History:  Diagnosis Date   Arthritis    Benign essential tremor    BPH (benign prostatic hyperplasia)    Concussion 1968   no residual from   COPD (chronic obstructive pulmonary disease) ([place])    no inhalers   COVID 03/04/2020   runny nose  at times  loss of taste and smell still present   GERD (gastroesophageal reflux disease)    History of hiatal hernia    small per dr Osborne Casco   History of testicular cancer 2005   right testicle removed and 15 radiation tx   Hx of gynecomastia    due to hcg shots for male infertility   Insomnia    Nocturia    Tobacco abuse    Varicocele    Wears glasses    for reading         Patient Active Problem List   Diagnosis Date Noted   Discitis 11/12/2021   Acute osteomyelitis of lumbar spine ([place]) 11/11/2021   Essential tremor    Anxiety    Mixed hyperlipidemia 04/25/2018   Aortic atherosclerosis ([place]) 04/25/2018   Abnormal CT scan, chest 07/17/2011   Tobacco abuse 07/17/2011    Psychiatric History:  Patient with prior diagnosis of anxiety  Family Med/Psych History:  Family History   Problem Relation Age of Onset   Prostate cancer Father    Stroke Mother    Prostate cancer Brother    Hypertension Sister    High Cholesterol Sister    Hypertension Sister    Memory loss Sister     Impression/DX:  Cameron [name] is a 71 year old male referred for neuropsychological consultation due to coping and adjustment issues during his CIR treatments.  Patient has a past medical history including testicular cancer, radiation therapy, BPH, anxiety, essential tremor.  Patient had developed severe back pain and presented to ED on 11/11/2021 and ultimately transferred to [facility].  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  Patient now on IV antibiotics for 6 weeks.  Patient was awake and alert although some dysarthric speech was noted.  Patient described the development of his pain first thinking that it was just a strain in his back but it progressively working to the point that he could not ambulate and  an ambulance was called to transport him to the hospital.  He was initially transported to [facility] and then over to [facility] for care.  The patient reports that he had always been very active and has been doing a lot of work around his home and initially simply thought he had pulled a muscle.  He reports that he understands what is happened to him medically and feels like he has been told clearly what has happened and what to expect.  Patient is having some degree of stress with extended hospital stay and severity and risk of current condition and still experiencing motor weakness and coordination issues.  The patient is highly motivated to continue with therapeutic interventions.  Disposition/Plan:  Today we worked on coping and adjustment issues and the patient appears to be highly motivated and ready to work hard through therapeutic interventions.  He is aware of what is going on with the medically with good  understanding.           Electronically Signed   _______________________ Ilean Skill, Psy.D. Clinical Neuropsychologist

## 2021-11-23 NOTE — Progress Notes (Signed)
Physical Therapy Session Note  Patient Details  Name: Cameron [name] MRN: [id number] Date of Birth: August 03, 1950  Today's Date: 11/23/2021 PT Individual Time: 1510-1535 PT Individual Time Calculation (min): 25 min   Short Term Goals: Week 1:  PT Short Term Goal 1 (Week 1): STG=LTG due to ELOS  Skilled Therapeutic Interventions/Progress Updates: Pt presented in bed agreeable to therapy. Pt c/o pain in low back, R knee and hip but unable to rate. Rest breaks and transitions back to supine provided as needed. Pt initially agreeable to attempt ambulation however once in standing (transfer to EOB supervision and Sit to stand CGA), pt indicated too much pain and R knee weakness to ambulate, therefore deferred. Pt agreeable to in room activities. Pt performed toe taps to 4in step with RW and CGA x 10 bilaterally for weight shifting, Sit to stand without AD x 6 from slightly elevated bed, and bridges x 12. Pt did require increased time between activities for pain management but all transfers to/from supine were performed with supervision. Pt left in bed at end of session with bed alarm on, call bell within reach and RN present for PICC line change.       Therapy Documentation Precautions:  Precautions Precautions: Fall, Back Precaution Comments: Back precautions for pain control (no formal order) Restrictions Weight Bearing Restrictions: No General:   Vital Signs: Therapy Vitals Temp: 98 F (36.7 C) Pulse Rate: 68 Resp: 16 BP: 138/63 Patient Position (if appropriate): Lying Oxygen Therapy SpO2: 100 % O2 Device: Room Air Pain:   Mobility:   Locomotion :    Trunk/Postural Assessment :    Balance:   Exercises:   Other Treatments:      Therapy/Group: Individual Therapy  [name] 11/23/2021, 4:00 PM

## 2021-11-23 NOTE — Progress Notes (Signed)
PROGRESS NOTE   Subjective/Complaints:  Pain appears to be a little improved today. Appears this fentanyl patch has fallen off, it will be replaced.   LBM 9/18   ROS: Denies fevers, chills, N/V, abdominal pain, constipation, diarrhea, SOB, cough, chest pain, new weakness or paraesthesias.   + RLE pain  Objective:   No results found. No results for input(s): "WBC", "HGB", "HCT", "PLT" in the last 72 hours.  No results for input(s): "NA", "K", "CL", "CO2", "GLUCOSE", "BUN", "CREATININE", "CALCIUM" in the last 72 hours.   Intake/Output Summary (Last 24 hours) at 11/23/2021 0806 Last data filed at 11/23/2021 0735 Gross per 24 hour  Intake 774 ml  Output 2400 ml  Net -1626 ml         Physical Exam: Vital Signs Blood pressure 129/71, pulse 71, temperature 98 F (36.7 C), resp. rate 16, height _0  (1.753 m), weight 72.6 kg, SpO2 94 %.  Constitutional: NAD,  ambulating with therapy, appears more comfortable toda HENT: No JVD. Neck Supple. Trachea midline. Atraumatic, normocephalic. Eyes: PERRLA. EOMI. Visual fields grossly intact.  Cardiovascular: RRR, no murmurs/rub/gallops. No Edema. Peripheral pulses 2+  Respiratory: CTAB. No rales, rhonchi, or wheezing. On RA.  Abdomen: + bowel sounds, normoactive. ND, NT Skin: C/D/I. No apparent lesions. Back biopsy area healed.  MSK:      No apparent deformity.      Strength:                RUE: 5/5 SA, 5/5 EF, 5/5 EE, 5/5 WE, 5/5 FF, 5/5 FA                 LUE: 5/5 SA, 5/5 EF, 5/5 EE, 5/5 WE, 5/5 FF, 5/5 FA                 RLE: 2/5 HF, 3/5 KE, 4/5 DF, 1/5 EHL (congenital), 4/5 PF  - limited by pain                 LLE:  5/5 HF, 5/5 KE, 5/5 DF, 5/5 EHL, 5/5 PF    Neurologic exam: follows commands Cognition: AAO to person, place, time and event.  Sensation: Altered/sensitive to light touch over medial right knee, wrapping to R hip and back following L3 dermatome.  Allodynia  in RLE Reflexes: Unable to elicit BL LE d/t gaurding.   Assessment/Plan: 1. Functional deficits which require 3+ hours per day of interdisciplinary therapy in a comprehensive inpatient rehab setting. Physiatrist is providing close team supervision and 24 hour management of active medical problems listed below. Physiatrist and rehab team continue to assess barriers to discharge/monitor patient progress toward functional and medical goals  Care Tool:  Bathing    Body parts bathed by patient: Right arm, Left arm, Left upper leg, Chest, Abdomen, Front perineal area, Buttocks, Right upper leg, Face, Right lower leg, Left lower leg     Body parts n/a: Right lower leg, Left lower leg   Bathing assist Assist Level: Set up assist (bed level)     Upper Body Dressing/Undressing Upper body dressing   What is the patient wearing?: Pull over shirt    Upper body assist Assist  Level: Set up assist Assistive Device Comment: bed level  Lower Body Dressing/Undressing Lower body dressing      What is the patient wearing?: Pants, Underwear/pull up     Lower body assist Assist for lower body dressing: Set up assist (bed level)     Toileting Toileting    Toileting assist Assist for toileting: Independent with assistive device Assistive Device Comment: urinal   Transfers Chair/bed transfer  Transfers assist  Chair/bed transfer activity did not occur: Safety/medical concerns (unable to perform due to radiating back pain)        Locomotion Ambulation   Ambulation assist   Ambulation activity did not occur: Safety/medical concerns (unable to perform due to radiating back pain)          Walk 10 feet activity   Assist  Walk 10 feet activity did not occur: Safety/medical concerns (unable to perform due to radiating back pain)        Walk 50 feet activity   Assist Walk 50 feet with 2 turns activity did not occur: Safety/medical concerns (unable to perform due to radiating  back pain)         Walk 150 feet activity   Assist Walk 150 feet activity did not occur: Safety/medical concerns (unable to perform due to radiating back pain)         Walk 10 feet on uneven surface  activity   Assist Walk 10 feet on uneven surfaces activity did not occur: Safety/medical concerns (unable to perform due to radiating back pain)         Wheelchair     Assist Is the patient using a wheelchair?: No   Wheelchair activity did not occur: Safety/medical concerns (unable to perform due to radiating back pain)         Wheelchair 50 feet with 2 turns activity    Assist    Wheelchair 50 feet with 2 turns activity did not occur: Safety/medical concerns (unable to perform due to radiating back pain)       Wheelchair 150 feet activity     Assist  Wheelchair 150 feet activity did not occur: Safety/medical concerns (unable to perform due to radiating back pain)       Blood pressure 129/71, pulse 71, temperature 98 F (36.7 C), resp. rate 16, height _0  (1.753 m), weight 72.6 kg, SpO2 94 %.  Medical Problem List and Plan: 1. 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  Status post L2-3 disc aspiration per interventional radiology.  Blood cultures no growth to date.  Decadron 4 mg every 12 hours x2 days then 4 mg x 2 days and stop.             -patient may shower             -ELOS/Goals: 10-14 days  -Continue CIR  -15/7 2.  Antithrombotics: -DVT/anticoagulation:  Mechanical: Antiembolism stockings, thigh (TED hose) Bilateral lower extremities             -antiplatelet therapy: none   3. Pain Management: Fentanyl patch/Duragesic patch, Neurontin 300 mg 3 times daily, Dilaudid 4 mg every 4 hours as needed pain, Robaxin as needed              - Zanaflex 4 mg TID instead of robaxin per pt preference              - Increase gabapentin to 600 mg TID for RLE neuropathic pain ; BUN/Cr stable 9/17              -  Last dose Decadron 9/17              - Tried baclofen 10 mg; Dced d/t no effect              - Valium 2 mg Q6H PRN added, given relative spasm-free intervals following QHS clonazepam - wean as tolerated              - Dilaudid 1 mg IV given 1x for imaging; reinforced to patient no further IV. Note, some nursing concern to seeking behavior.              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 - Concern to complete ESI given possible discitis/osteomyelitis,   -9/19 increase gabapentin to 881m TID,  continue fentanyl 12 patch, increase zanaflex to 458mQID for muscle spasms  9/20 increase fentanyl to 2570m called NSGY to discuss  9/21 Fentanyl patch has fallen off, will replace, retry calling NSGY today 4. Mood/Behavior/Sleep: Klonopin 0.25 mg nightly, Zoloft 50 mg daily             -antipsychotic agents: N/A             - DC klonopin (home med) given PRN valium above; pt agreeable  -Continue valium 2mg71mH PRN 5. Neuropsych/cognition: This patient is capable of making decisions on his own behalf. 6. Skin/Wound Care: Routine skin checks 7. Fluids/Electrolytes/Nutrition: Routine in and outs with follow-up chemistries             - CMP stable 9/18 8.  ID/discitis/osteomyelitis.  Intravenous daptomycin and ceftriaxone through 12/25/2021 per infectious disease             - Disc cultures NGTD             - MRI lumbar spine 9/17 stable             - Weekly labs to ID, q2week ESR/CRP  -CRP 9/17 0.6 9.  Tremors.  Mysoline as directed 10.  Tobacco abuse.  Provide counseling 11.  GERD.  Protonix 40mg64mly 12.  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  Follow-up outpatient 13. Low albumin. mild 3.4, encourage protein intake  -Add ensure 14. Constipation  -Last BM 9/18, continue senokot and  increase miralax to BID, If no Bm today may need additional medication      LOS: 5 days A FACE TO FACE EVALUATION WAS PERFORMED  Zain Lankford Jennye Boroughs/2023, 8:06 AM

## 2021-11-23 NOTE — Progress Notes (Signed)
Physical Therapy Session Note  Patient Details  Name: Cameron [name] MRN: [id number] Date of Birth: January 19, 1951  Today's Date: 11/23/2021 PT Individual Time: 0805-0905 PT Individual Time Calculation (min): 60 min   Short Term Goals: Week 1:  PT Short Term Goal 1 (Week 1): STG=LTG due to ELOS  Skilled Therapeutic Interventions/Progress Updates: Pt presented in bed agreeable to therapy. Pt with increased pain, did not rate immediately, increased to 9/10 during session. Rest breaks and cues for diaphragmatic breathing provided throughout session. Nsg arrived at beginning of session to provide am meds which were provided at bed level. Pt donned pants and socks bed level for pain management with set up. Performed supine to sit with supervision and use of bed features and brushed teeth at bedside with set up. Pt returned to supine supervision due to pain. After short rest returned to sitting EOB and donned LSO with minA. Pt then requesting to attempt urinary void at toilet. Performed ambulatory transfer to toilet with CGA and RW and performed had continent urinary void in standing with CGA. Pt then returned to bed and had rest in sidelying. Pt returned to sitting EOB and then ambulated to day room and back ~335f with CGA with x 1 occurrence of R knee buckling upon reentering room, however no LOB and pt was able to recover. Pt returned to bed at end of session MD present for daily assessment and all needs met.      Therapy Documentation Precautions:  Precautions Precautions: Fall, Back Precaution Comments: Back precautions for pain control (no formal order) Restrictions Weight Bearing Restrictions: No General:   Vital Signs:   Pain:   Mobility:   Locomotion :    Trunk/Postural Assessment :    Balance:   Exercises:   Other Treatments:      Therapy/Group: Individual Therapy  [name] 11/23/2021, 12:30 PM

## 2021-11-23 NOTE — Progress Notes (Signed)
Patient ID: Cameron [name], male   DOB: Feb 13, 1951, 71 y.o.   MRN: [id number]  SW met with pt in room to discuss ordering 3in1 BSC as recommended by therapy.   He intends to speak with his wife to determine if this is needed.   Loralee Pacas, MSW, [place] Office: [phone] Cell: [phone] Fax: [phone]

## 2021-11-24 DIAGNOSIS — K5903 Drug induced constipation: Secondary | ICD-10-CM | POA: Diagnosis not present

## 2021-11-24 DIAGNOSIS — M4646 Discitis, unspecified, lumbar region: Secondary | ICD-10-CM | POA: Diagnosis not present

## 2021-11-24 DIAGNOSIS — M79604 Pain in right leg: Secondary | ICD-10-CM | POA: Diagnosis not present

## 2021-11-24 DIAGNOSIS — R770 Abnormality of albumin: Secondary | ICD-10-CM | POA: Diagnosis not present

## 2021-11-24 MED ORDER — GABAPENTIN 400 MG PO CAPS: 1200.0000 mg | ORAL_CAPSULE | Freq: Three times a day (TID) | ORAL | Status: DC

## 2021-11-24 MED ORDER — DULOXETINE HCL 30 MG PO CPEP
30.0000 mg | ORAL_CAPSULE | Freq: Every day | ORAL | Status: AC
  Administered 2021-11-24 – 2021-11-30 (×7): 30 mg via ORAL
  Filled 2021-11-24 (×7): qty 1

## 2021-11-24 MED ORDER — DULOXETINE HCL 60 MG PO CPEP
60.0000 mg | ORAL_CAPSULE | Freq: Every day | ORAL | Status: DC
  Administered 2021-12-01 – 2021-12-06 (×6): 60 mg via ORAL
  Filled 2021-11-24 (×7): qty 1

## 2021-11-24 MED ORDER — GABAPENTIN 400 MG PO CAPS
800.0000 mg | ORAL_CAPSULE | Freq: Three times a day (TID) | ORAL | Status: DC
  Administered 2021-11-24 – 2021-11-27 (×10): 800 mg via ORAL
  Filled 2021-11-24 (×10): qty 2

## 2021-11-24 MED ORDER — CHLORHEXIDINE GLUCONATE CLOTH 2 % EX PADS
6.0000 | MEDICATED_PAD | Freq: Two times a day (BID) | CUTANEOUS | Status: DC
  Administered 2021-11-24 – 2021-12-01 (×15): 6 via TOPICAL

## 2021-11-24 NOTE — Progress Notes (Signed)
Physical Therapy Session Note  Patient Details  Name: Cameron [name] MRN: [id number] Date of Birth: [birth year]  Today's Date: 11/24/2021 PT Individual Time: 4847-2072 PT Individual Time Calculation (min): 25 min   Short Term Goals: Week 1:  PT Short Term Goal 1 (Week 1): STG=LTG due to ELOS  Skilled Therapeutic Interventions/Progress Updates: Pt presented in bed with noted spasms occurring. Pt agreeable to attempt therapy but pt indicating significant pain at this time. PTA and pt spoke briefly with pt indicated had received pain meds. PTA suggested to come back a little later to allow meds to take affect with pt agreeable.   PTA returned 30 min later with pt not demonstrating any spasms and pt stating sill significant pain but more controlled (9/10). Pt wishing to attempt ambulation. Performed bed mobility with supervision and Sit to stand with CGA. Pt ambulated ~181f with RW and CGA with x 1 standing rest break against wall for pain management but no occurences of knee buckling at this time. Upon return to room pt returned to supine for rest break and pain management. Pt then requesting to use bathroom, performed bed mobility in same manner and transferred to toilet with RW and CGA. Pt was able to have continent urinary void in standing. Pt returned to bed at end of session and left resting in bed with bed alarm on, call bell within reach and needs met.      Therapy Documentation Precautions:  Precautions Precautions: Fall, Back Precaution Comments: Back precautions for pain control (no formal order) Restrictions Weight Bearing Restrictions: No General:   Vital Signs:   Pain: Pain Assessment Pain Scale: 0-10 Pain Score: 6  Pain Location: Hip Pain Orientation: Right Pain Intervention(s): Medication (See eMAR) Mobility:   Locomotion :    Trunk/Postural Assessment :    Balance:   Exercises:   Other Treatments:      Therapy/Group: Individual Therapy  [name] 11/24/2021, 12:59 PM

## 2021-11-24 NOTE — Progress Notes (Addendum)
PROGRESS NOTE   Subjective/Complaints:  Seen byDr. Sima Matas yesterday.  Reports he slept well last night and pain was better controlled. This AM his pain is back up.   LBM 9/21   ROS: Denies fevers, chills, N/V, abdominal pain, constipation, diarrhea, SOB, cough, chest pain, new weakness or paraesthesias.   + RLE pain  Objective:   No results found. No results for input(s): "WBC", "HGB", "HCT", "PLT" in the last 72 hours.  No results for input(s): "NA", "K", "CL", "CO2", "GLUCOSE", "BUN", "CREATININE", "CALCIUM" in the last 72 hours.   Intake/Output Summary (Last 24 hours) at 11/24/2021 0801 Last data filed at 11/24/2021 0521 Gross per 24 hour  Intake 944 ml  Output 1200 ml  Net -256 ml         Physical Exam: Vital Signs Blood pressure 136/79, pulse 84, temperature 98 F (36.7 C), resp. rate 15, height 5' 9"  (1.753 m), weight 72.6 kg, SpO2 95 %.  Constitutional: appears uncomfortable HENT: No JVD. Neck Supple. Trachea midline. Atraumatic, normocephalic. Eyes: PERRLA. EOMI. Visual fields grossly intact.  Cardiovascular: RRR, no murmurs/rub/gallops. No Edema. Peripheral pulses 2+  Respiratory: CTAB. No rales, rhonchi, or wheezing. On RA.  Abdomen: + bowel sounds, normoactive. ND, NT Skin: C/D/I. No apparent lesions. Back biopsy area healed.  MSK:      No apparent deformity.      Strength:                RUE: 5/5 SA, 5/5 EF, 5/5 EE, 5/5 WE, 5/5 FF, 5/5 FA                 LUE: 5/5 SA, 5/5 EF, 5/5 EE, 5/5 WE, 5/5 FF, 5/5 FA                 RLE: 2/5 HF, 3/5 KE, 4/5 DF, 1/5 EHL (congenital), 4/5 PF  - limited by pain                 LLE:  5/5 HF, 5/5 KE, 5/5 DF, 5/5 EHL, 5/5 PF    Neurologic exam: follows commands Cognition: AAO to person, place, time and event.  Sensation: Altered/sensitive to light touch over medial right knee, wrapping to R hip and back following L3 dermatome.  Allodynia in RLE Reflexes:  Unable to elicit BL LE d/t gaurding.   Assessment/Plan: 1. Functional deficits which require 3+ hours per day of interdisciplinary therapy in a comprehensive inpatient rehab setting. Physiatrist is providing close team supervision and 24 hour management of active medical problems listed below. Physiatrist and rehab team continue to assess barriers to discharge/monitor patient progress toward functional and medical goals  Care Tool:  Bathing    Body parts bathed by patient: Right arm, Left arm, Left upper leg, Chest, Abdomen, Front perineal area, Buttocks, Right upper leg, Face, Right lower leg, Left lower leg     Body parts n/a: Right lower leg, Left lower leg   Bathing assist Assist Level: Set up assist (bed level)     Upper Body Dressing/Undressing Upper body dressing   What is the patient wearing?: Pull over shirt    Upper body assist Assist Level: Set up  assist Assistive Device Comment: bed level  Lower Body Dressing/Undressing Lower body dressing      What is the patient wearing?: Pants, Underwear/pull up     Lower body assist Assist for lower body dressing: Set up assist (bed level)     Toileting Toileting    Toileting assist Assist for toileting: Independent with assistive device Assistive Device Comment: urinal   Transfers Chair/bed transfer  Transfers assist  Chair/bed transfer activity did not occur: Safety/medical concerns (unable to perform due to radiating back pain)        Locomotion Ambulation   Ambulation assist   Ambulation activity did not occur: Safety/medical concerns (unable to perform due to radiating back pain)          Walk 10 feet activity   Assist  Walk 10 feet activity did not occur: Safety/medical concerns (unable to perform due to radiating back pain)        Walk 50 feet activity   Assist Walk 50 feet with 2 turns activity did not occur: Safety/medical concerns (unable to perform due to radiating back pain)          Walk 150 feet activity   Assist Walk 150 feet activity did not occur: Safety/medical concerns (unable to perform due to radiating back pain)         Walk 10 feet on uneven surface  activity   Assist Walk 10 feet on uneven surfaces activity did not occur: Safety/medical concerns (unable to perform due to radiating back pain)         Wheelchair     Assist Is the patient using a wheelchair?: No   Wheelchair activity did not occur: Safety/medical concerns (unable to perform due to radiating back pain)         Wheelchair 50 feet with 2 turns activity    Assist    Wheelchair 50 feet with 2 turns activity did not occur: Safety/medical concerns (unable to perform due to radiating back pain)       Wheelchair 150 feet activity     Assist  Wheelchair 150 feet activity did not occur: Safety/medical concerns (unable to perform due to radiating back pain)       Blood pressure 136/79, pulse 84, temperature 98 F (36.7 C), resp. rate 15, height 5' 9"  (1.753 m), weight 72.6 kg, SpO2 95 %.  Medical Problem List and Plan: 1. 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  Status post L2-3 disc aspiration per interventional radiology.  Blood cultures no growth to date.  Decadron 4 mg every 12 hours x2 days then 4 mg x 2 days and stop.             -patient may shower             -ELOS/Goals: 10-14 days  -Continue CIR  -15/7 2.  Antithrombotics: -DVT/anticoagulation:  Mechanical: Antiembolism stockings, thigh (TED hose) Bilateral lower extremities             -antiplatelet therapy: none   3. Pain Management: Fentanyl patch/Duragesic patch, Neurontin 300 mg 3 times daily, Dilaudid 4 mg every 4 hours as needed pain, Robaxin as needed              - Zanaflex 4 mg TID instead of robaxin per pt preference              - Increase gabapentin to 600 mg TID for RLE neuropathic pain ; BUN/Cr stable 9/17              -  Last dose  Decadron 9/17              - Tried baclofen 10 mg; Dced d/t no effect              - Valium 2 mg Q6H PRN added, given relative spasm-free intervals following QHS clonazepam - wean as tolerated              - Dilaudid 1 mg IV given 1x for imaging; reinforced to patient no further IV. Note, some nursing concern to seeking behavior.              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 Mood/Behavior/Sleep: Klonopin 0.25 mg nightly, Zoloft 50 mg daily             -antipsychotic agents: N/A             - DC klonopin (home med) given PRN valium above; pt agreeable  -Continue valium 2mg 81m PRN 5. Neuropsych/cognition: This patient is capable of making decisions on his own behalf. 6. Skin/Wound Care: Routine skin checks 7. Fluids/Electrolytes/Nutrition: Routine in and outs with follow-up chemistries             - CMP stable 9/18 8.  ID/discitis/osteomyelitis.  Intravenous daptomycin and ceftriaxone through 12/25/2021 per infectious disease             - Disc cultures NGTD             - MRI lumbar spine 9/17 stable             - Weekly labs to ID, q2week ESR/CRP  -CRP 9/17 0.6 9.  Tremors.  Mysoline as directed 10.  Tobacco abuse.  Provide counseling 11.  GERD.  Protonix 40mg 76my 12.  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  Follow-up outpatient 13. Low albumin. mild 3.4,  encourage protein intake  -Add ensure, appears PO intake OK for some meals/ decreased other times 14. Constipation  -Last BM 9/21 improved, continue current medications    LOS: 6 days A FACE TO FACE EVALUATION WAS PERFORMED  Donica Derouin SJennye Boroughs2023, 8:01 AM

## 2021-11-24 NOTE — Progress Notes (Signed)
Occupational Therapy Weekly Progress Note  Patient Details  Name: RUSTY VILLELLA MRN: [id number] Date of Birth: [birth year]  Beginning of progress report period: November 19, 2021 End of progress report period: November 24, 2021     Pt is making slow and inconsistent gains with BADL and functional amb/tranfsers with RW. Pt currently completes bathing/dressing at bed level 2/2 unable to tolerate sitting EOB or on TTB. Setup for bathing/dressing. Pt amb with RW to bathroom to sit on BSC with CGA but unable to tolerate sitting on BSC. Functional transfers with RW requiring CGA when able to tolerate. Amb with RW 200'+ with CGA when able to tolerate.Pt with intermittent spasms/Rt hip/BLE pain limiting participation in therapies. Pt's frequency is now qd - with focus on myofascial release and opportunities to get up to perform ADLs  Patient continues to demonstrate the following deficits:  acute pain  and therefore will continue to benefit from skilled OT intervention to enhance overall performance with BADL and Reduce care partner burden.  Patient progressing toward long term goals..  Continue plan of care.  OT Short Term Goals Week 1:  OT Short Term Goal 1 (Week 1): LTG=STG Week 2:  OT Short Term Goal 1 (Week 2): STG=LTG 2/2 ELOS   Therapy/Group: Individual Therapy  Leroy Libman 11/24/2021, 11:47 AM

## 2021-11-24 NOTE — Progress Notes (Signed)
Occupational Therapy Session Note  Patient Details  Name: Cameron [name] MRN: [id number] Date of Birth: [birth year]  Today's Date: 11/24/2021 OT Individual Time: 9597-4718 OT Individual Time Calculation (min): 30 min  and Today's Date: 11/24/2021 OT Missed Time: 11 Minutes Missed Time Reason: Pain   Short Term Goals: Week 1:  OT Short Term Goal 1 (Week 1): LTG=STG  Skilled Therapeutic Interventions/Progress Updates:    Pt in bed upon arrival with RN present.Pt requested to rest a few mins to let "pain meds kick in" before attempting to move and take a shower. Pt looking forward to taking shower this morning. Upon return, pt continues to report significant pain but wants to try sitting EOB and standing to see if activity will help relieve pain. EOB with supervision. Sit<>stand with supervision. Pt with significant spasms this morning. Pain not relieved with activity and pt returned to supine. Pt states he is unable to try this morning. Discussed scheduling therapies later in morning to allow time for pain meds to be effective. Pt missed 45 mins skilled OT services 2/2 pain. Will attempt to see again as schedule allows.  Therapy Documentation Precautions:  Precautions Precautions: Fall, Back Precaution Comments: Back precautions for pain control (no formal order) Restrictions Weight Bearing Restrictions: No General: General OT Amount of Missed Time: 45 Minutes Pain: Pain Assessment Pain Scale: 0-10 Pain Score: 8  Pain Type: Acute pain Pain Location: Hip Pain Orientation: Right Pain Radiating Towards: right knee Pain Descriptors / Indicators: Aching;Shooting Pain Intervention(s): Pain med given for lower pain score than stated, per patient request; repositioned with activity   Therapy/Group: Individual Therapy  Leroy Libman 11/24/2021, 9:18 AM

## 2021-11-24 NOTE — Progress Notes (Signed)
Physical Therapy Session Note  Patient Details  Name: ANTWOINE ZORN MRN: [id number] Date of Birth: Jul 17, 1950  Today's Date: 11/24/2021 PT Individual Time: 8251-8984 PT Individual Time Calculation (min): 31 min   Short Term Goals: Week 1:  PT Short Term Goal 1 (Week 1): STG=LTG due to ELOS  Skilled Therapeutic Interventions/Progress Updates:    pt received in bed and agreeable to therapy. Pt reports 8/10 back pain with mobility, premedicated. Rest and positioning provided as needed. Supine<>sit with log roll technique with supervision. Sit to stand to RW with CGA. Pt ambulated x 390 ft with RW and CGA, w/c follow in case of fatigue. Pt returned to supine d/t back pain. Pt opted for bed level strength so performed bridges 2 x 10 and 1 x 20 for endurance and functional mobility. Pt remained in bed at end of session and was left with all needs in reach and alarm active.   Therapy Documentation Precautions:  Precautions Precautions: Fall, Back Precaution Comments: Back precautions for pain control (no formal order) Restrictions Weight Bearing Restrictions: No General:       Therapy/Group: Individual Therapy  Mickel Fuchs 11/24/2021, 3:31 PM

## 2021-11-25 DIAGNOSIS — M4640 Discitis, unspecified, site unspecified: Secondary | ICD-10-CM

## 2021-11-25 DIAGNOSIS — M5416 Radiculopathy, lumbar region: Secondary | ICD-10-CM | POA: Diagnosis not present

## 2021-11-25 DIAGNOSIS — M4646 Discitis, unspecified, lumbar region: Secondary | ICD-10-CM | POA: Diagnosis not present

## 2021-11-25 DIAGNOSIS — F411 Generalized anxiety disorder: Secondary | ICD-10-CM | POA: Diagnosis not present

## 2021-11-25 LAB — GLUCOSE, CAPILLARY
Glucose-Capillary: 122 mg/dL — ABNORMAL HIGH (ref 70–99)
Glucose-Capillary: 108 mg/dL — ABNORMAL HIGH (ref 70–99)
Glucose-Capillary: 81 mg/dL (ref 70–99)

## 2021-11-25 MED ORDER — MORPHINE SULFATE ER 15 MG PO TBCR
30.0000 mg | EXTENDED_RELEASE_TABLET | Freq: Two times a day (BID) | ORAL | Status: DC
  Administered 2021-11-25 – 2021-12-05 (×21): 30 mg via ORAL
  Filled 2021-11-25 (×21): qty 2

## 2021-11-25 MED ORDER — TIZANIDINE HCL 4 MG PO TABS
4.0000 mg | ORAL_TABLET | Freq: Four times a day (QID) | ORAL | Status: DC
  Administered 2021-11-25 – 2021-11-29 (×16): 4 mg via ORAL
  Filled 2021-11-25 (×16): qty 1

## 2021-11-25 NOTE — Progress Notes (Addendum)
Physical Therapy Session Note  Patient Details  Name: Cameron [name] MRN: [id number] Date of Birth: 1950-05-02  Today's Date: 11/25/2021 PT Individual Time: 0917-1000; 1300-1350 PT Individual Time Calculation (min): 43 min , 50 min  Short Term Goals: Week 1:  PT Short Term Goal 1 (Week 1): STG=LTG due to ELOS     Skilled Therapeutic Interventions/Progress Updates:  Tx 1:  Pt lying in bed, clutching the bed rails, obviously uncomfortable in pain R pelvis> RLL, rated 7/10 , premedicated.  Pt with jerking motions of RLE during conversation, due to pain.  PT instructed pt in relaxation imagery and diaphragmatic breathing, with good carryover.  Supine> sit with supervision.  Sit> stand to RW with CGA.  Gait training over level tile x 160' with RW, CGA. Distance limited by pain, and pt's request to return to bed. Sit> stand with CGA, transfer with min assist as R knee buckled moving toward bed.  Sit> supine with supervision.    Therapeutic exercise sperformed with UEs, trunk to increase strength for functional mobility: in supine: 15 x 2 R/L shoulder protraction with extended elbow, deep cervical flexion, 15 x 1 R/L short arc quad knee extension, with no increase in pain noted.  At end of session, pt resting in bed with needs at hand and bed alarm set.  Tx 2:  Pt resting in bed. His meds have been changed; pain at rest 2/10, RLE.  Supine> sit with supervision and bed features.  Sit> stand with 1 hand on RW, 1 hand on bed, CGA.    Gait training x 180' x 2 to/from main gyn , RW, on level tile, with 2 turns, CGA.  Pt unable to rest in sitting, due to increase in R pelvic/LE pain.  Pt comfortable resting in bed. Up/down (8) 4; high steps, 2 rails, with step to method, CGA.  When attempting step-through method, R knee buckled slightly.    Therapeutic exercise performed with LE to increase strength for functional mobility. In supine: 15 x 1 bil bridging, 15 x 1 bil bridging with adductor  squeezes, 5 x 1 R unilateral bridging.   Reviewed relaxation imagery and diaphragmatic breathing, and discussed with wife.   At end of session, pt resting in supine, with needs at hand and alarm set.         Therapy Documentation Precautions:  Precautions Precautions: Fall, Back Precaution Comments: Back precautions for pain control (no formal order) Restrictions Weight Bearing Restrictions: No      Therapy/Group: Individual Therapy  Shanai Lartigue 11/25/2021, 10:13 AM

## 2021-11-25 NOTE — Progress Notes (Signed)
PROGRESS NOTE   Subjective/Complaints:  Pt continues to really struggle with pain particularly at night. Pain is in his back with radiation to right hip and RLE in particular. Associated spasms as well of back and leg  ROS: Patient denies fever, rash, sore throat, blurred vision, dizziness, nausea, vomiting, diarrhea, cough, shortness of breath or chest pain, headache, or mood change.   Objective:   No results found. No results for input(s): "WBC", "HGB", "HCT", "PLT" in the last 72 hours.  No results for input(s): "NA", "K", "CL", "CO2", "GLUCOSE", "BUN", "CREATININE", "CALCIUM" in the last 72 hours.   Intake/Output Summary (Last 24 hours) at 11/25/2021 0943 Last data filed at 11/25/2021 0745 Gross per 24 hour  Intake 1567 ml  Output 3000 ml  Net -1433 ml        Physical Exam: Vital Signs Blood pressure 119/66, pulse 65, temperature 97.6 F (36.4 C), resp. rate 16, height 5' 9"  (1.753 m), weight 72.6 kg, SpO2 97 %.  Constitutional: No distress . Vital signs reviewed. HEENT: NCAT, EOMI, oral membranes moist Neck: supple Cardiovascular: RRR without murmur. No JVD    Respiratory/Chest: CTA Bilaterally without wheezes or rales. Normal effort    GI/Abdomen: BS +, non-tender, non-distended Ext: no clubbing, cyanosis, or edema Psych: pleasant and cooperative  Skin: C/D/I. No apparent lesions. Back biopsy area healed.  OIB:BCWU is tender with palpation.       No apparent deformity.      Strength:                RUE: 5/5 SA, 5/5 EF, 5/5 EE, 5/5 WE, 5/5 FF, 5/5 FA                 LUE: 5/5 SA, 5/5 EF, 5/5 EE, 5/5 WE, 5/5 FF, 5/5 FA                 RLE: 2/5 HF, 3/5 KE, 4/5 DF, 1/5 EHL (congenital), 4/5 PF  - limited by pain                 LLE:  5/5 HF, 5/5 KE, 5/5 DF, 5/5 EHL, 5/5 PF    Neurologic exam: follows commands Cognition: AAO to person, place, time and event.  Sensation: Altered/sensitive to light touch over  medial right knee, wrapping to R hip and back following L3 dermatome.--my exam is c/w this as well.  Allodynia in RLE Reflexes: decreased partially d/t BL LE  gaurding.   Assessment/Plan: 1. Functional deficits which require 3+ hours per day of interdisciplinary therapy in a comprehensive inpatient rehab setting. Physiatrist is providing close team supervision and 24 hour management of active medical problems listed below. Physiatrist and rehab team continue to assess barriers to discharge/monitor patient progress toward functional and medical goals  Care Tool:  Bathing    Body parts bathed by patient: Right arm, Left arm, Left upper leg, Chest, Abdomen, Front perineal area, Buttocks, Right upper leg, Face, Right lower leg, Left lower leg     Body parts n/a: Right lower leg, Left lower leg   Bathing assist Assist Level: Set up assist (bed level)     Upper Body Dressing/Undressing Upper  body dressing   What is the patient wearing?: Pull over shirt    Upper body assist Assist Level: Set up assist Assistive Device Comment: bed level  Lower Body Dressing/Undressing Lower body dressing      What is the patient wearing?: Pants, Underwear/pull up     Lower body assist Assist for lower body dressing: Set up assist (bed level)     Toileting Toileting    Toileting assist Assist for toileting: Independent with assistive device Assistive Device Comment: urinal   Transfers Chair/bed transfer  Transfers assist  Chair/bed transfer activity did not occur: Safety/medical concerns (unable to perform due to radiating back pain)        Locomotion Ambulation   Ambulation assist   Ambulation activity did not occur: Safety/medical concerns (unable to perform due to radiating back pain)          Walk 10 feet activity   Assist  Walk 10 feet activity did not occur: Safety/medical concerns (unable to perform due to radiating back pain)        Walk 50 feet  activity   Assist Walk 50 feet with 2 turns activity did not occur: Safety/medical concerns (unable to perform due to radiating back pain)         Walk 150 feet activity   Assist Walk 150 feet activity did not occur: Safety/medical concerns (unable to perform due to radiating back pain)         Walk 10 feet on uneven surface  activity   Assist Walk 10 feet on uneven surfaces activity did not occur: Safety/medical concerns (unable to perform due to radiating back pain)         Wheelchair     Assist Is the patient using a wheelchair?: No   Wheelchair activity did not occur: Safety/medical concerns (unable to perform due to radiating back pain)         Wheelchair 50 feet with 2 turns activity    Assist    Wheelchair 50 feet with 2 turns activity did not occur: Safety/medical concerns (unable to perform due to radiating back pain)       Wheelchair 150 feet activity     Assist  Wheelchair 150 feet activity did not occur: Safety/medical concerns (unable to perform due to radiating back pain)       Blood pressure 119/66, pulse 65, temperature 97.6 F (36.4 C), resp. rate 16, height $RemoveBe'5\' 9"'evtZorDXY$  (1.753 m), weight 72.6 kg, SpO2 97 %.  Medical Problem List and Plan: 1. 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  Status post L2-3 disc aspiration per interventional radiology.  Blood cultures no growth to date.  Decadron 4 mg every 12 hours x2 days then 4 mg x 2 days and stop.             -patient may shower             -ELOS/Goals: 10-14 days  -Continue CIR  -15/7 2.  Antithrombotics: -DVT/anticoagulation:  Mechanical: Antiembolism stockings, thigh (TED hose) Bilateral lower extremities             -antiplatelet therapy: none   3. Pain Management: Fentanyl patch/Duragesic patch, Neurontin 300 mg 3 times daily, Dilaudid 4 mg every 4 hours as needed pain, Robaxin as needed              - Zanaflex 4 mg TID instead of  robaxin per pt preference              -  Increase gabapentin to 600 mg TID for RLE neuropathic pain ; BUN/Cr stable 9/17              - Last dose Decadron 9/17              - Tried baclofen 10 mg; Dced d/t no effect              - Valium 2 mg Q6H PRN added, given relative spasm-free intervals following QHS clonazepam - wean as tolerated              - Dilaudid 1 mg IV given 1x for imaging; reinforced to patient no further IV. Note, some nursing concern to seeking behavior.              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 Mood/Behavior/Sleep: Klonopin 0.25 mg nightly, Zoloft 50 mg daily             -antipsychotic agents: N/A             - DC klonopin (home med) given PRN valium above; pt agreeable  -Continue valium 2mg Q14mPRN  -appreciate neuropsych input 5. Neuropsych/cognition: This patient is capable of making decisions on his own behalf. 6. Skin/Wound Care: Routine skin checks 7. Fluids/Electrolytes/Nutrition: Routine in and outs with follow-up chemistries             - CMP stable 9/18 8.  ID/discitis/osteomyelitis.  Intravenous daptomycin and ceftriaxone through 12/25/2021 per infectious disease             - Disc cultures NGTD             - MRI lumbar spine 9/17 stable              - Weekly labs to ID, q2week ESR/CRP  -CRP 9/17 0.6 9.  Tremors.  Mysoline as directed 10.  Tobacco abuse.  Provide counseling 11.  GERD.  Protonix 40mg d92m 12.  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  Follow-up outpatient 13. Low albumin. mild 3.4, encourage protein intake  -Add ensure, appears PO intake OK for some meals/ decreased other times 14. Constipation  -Last BM 9/22 improved, continue current medications    LOS: 7 days A FACE TO FACE EVALUATION WAS PERFORMED  ZacharyMeredith Staggers023, 9:43 AM

## 2021-11-25 NOTE — Progress Notes (Addendum)
Occupational Therapy Session Note  Patient Details  Name: Cameron [name] MRN: [id number] Date of Birth: Jan 07, 1951  Today's Date: 11/25/2021 OT Individual Time: [phone] OT Individual Time Calculation (min): 40 min    Short Term Goals: Week 1:  OT Short Term Goal 1 (Week 1): LTG=STG Week 2:  OT Short Term Goal 1 (Week 2): STG=LTG 2/2 ELOS   Skilled Therapeutic Interventions/Progress Updates:    Subjective: Pt states he wants a shower really bad.  Wife present throughout session for support and encouragment.    Objective:  Pt semi reclined in bed. Supine to sit with supervision.  Sit to stand and ambulation using RW to shower, then pivot sit on padded shower bench with CGA.  Pt doffed shirt, underwear and pants with min assist.  Cues needed and education on completing LB dressing over feet in seated position for safety.  Waterproof barrier applied to RUE over PICC line placement.  Pt bathed UB with close supervision and LB with CGA at sit<>stand level.Patient ambulated to toilet and urinated in standing with CGA.  Ambulated with use of RW to EOB needing CGA.  Educated pt on importance and technique of keeping BLE inside RW due to during turns and pivots noting patient walking outside device.  Teachback provided by pt verbally.  Pt donned shirt with min assist and donned underwear and pants with mod assist primarily secondary to wife assisting more than pt necessarily required.  Call bell in reach, seat alarm on.     Assessment:  Pt tolerated bathing at shower level with only slight fatigue, noting slight unsteadiness during dynamic sitting and standing balance, and tending to rush through tasks, needing frequent verbal cues to slow movements for improved balance and control.     Plan: Pt would benefit from further training on turning and stand pivots using RW and dynamic sitting and standing balance tasks to facilitate increased ADL safety and independence.   Therapy  Documentation Precautions:  Precautions Precautions: Fall, Back Precaution Comments: Back precautions for pain control (no formal order) Restrictions Weight Bearing Restrictions: No    Therapy/Group: Individual Therapy  Ezekiel Slocumb 11/25/2021, 5:15 PM

## 2021-11-25 NOTE — Progress Notes (Signed)
Patient slept well from 9pm until 01:30am. Patient wake up due to increase pain. Pain management in progress, some effect noted.Pain is constant, sharp, pain level stays 6 of 10 during this tour.

## 2021-11-26 DIAGNOSIS — M5416 Radiculopathy, lumbar region: Secondary | ICD-10-CM | POA: Diagnosis not present

## 2021-11-26 DIAGNOSIS — M4640 Discitis, unspecified, site unspecified: Secondary | ICD-10-CM | POA: Diagnosis not present

## 2021-11-26 DIAGNOSIS — F411 Generalized anxiety disorder: Secondary | ICD-10-CM | POA: Diagnosis not present

## 2021-11-26 DIAGNOSIS — M4646 Discitis, unspecified, lumbar region: Secondary | ICD-10-CM | POA: Diagnosis not present

## 2021-11-26 NOTE — Progress Notes (Signed)
PROGRESS NOTE   Subjective/Complaints:  Pt had a much better day and night with morphine. Still has stabbing pain in back but slept 6 hours last night and in better spirits today  ROS: Patient denies fever, rash, sore throat, blurred vision, dizziness, nausea, vomiting, diarrhea, cough, shortness of breath or chest pain,   headache, or mood change.   Objective:   No results found. No results for input(s): "WBC", "HGB", "HCT", "PLT" in the last 72 hours.  No results for input(s): "NA", "K", "CL", "CO2", "GLUCOSE", "BUN", "CREATININE", "CALCIUM" in the last 72 hours.   Intake/Output Summary (Last 24 hours) at 11/26/2021 0833 Last data filed at 11/26/2021 0715 Gross per 24 hour  Intake 830 ml  Output 2050 ml  Net -1220 ml        Physical Exam: Vital Signs Blood pressure (!) 150/64, pulse 66, temperature 97.8 F (36.6 C), resp. rate 16, height _0  (1.753 m), weight 72.6 kg, SpO2 96 %.  Constitutional: No distress . Vital signs reviewed. HEENT: NCAT, EOMI, oral membranes moist Neck: supple Cardiovascular: RRR without murmur. No JVD    Respiratory/Chest: CTA Bilaterally without wheezes or rales. Normal effort    GI/Abdomen: BS +, non-tender, non-distended Ext: no clubbing, cyanosis, or edema Psych: pleasant and cooperative  Skin: C/D/I. No apparent lesions. Back biopsy area healed.  XBJ:YNWG is tender with palpation.       No apparent deformity.      Strength:                RUE: 5/5 SA, 5/5 EF, 5/5 EE, 5/5 WE, 5/5 FF, 5/5 FA                 LUE: 5/5 SA, 5/5 EF, 5/5 EE, 5/5 WE, 5/5 FF, 5/5 FA                 RLE: 2/5 HF, 3/5 KE, 4/5 DF, 1/5 EHL (congenital), 4/5 PF  - limited by pain                 LLE:  5/5 HF, 5/5 KE, 5/5 DF, 5/5 EHL, 5/5 PF    Neurologic exam:   Cognition: Alert and oriented x 3. Normal insight and awareness. Intact Memory. Normal language and speech. Cranial nerve exam unremarkable  Sensation:  Altered/sensitive to light touch over medial right knee, wrapping to R hip and back following L3 dermatome.--my exam is c/w this as well.  Allodynia in RLE Reflexes: decreased partially d/t BL LE  guarding.   Assessment/Plan: 1. Functional deficits which require 3+ hours per day of interdisciplinary therapy in a comprehensive inpatient rehab setting. Physiatrist is providing close team supervision and 24 hour management of active medical problems listed below. Physiatrist and rehab team continue to assess barriers to discharge/monitor patient progress toward functional and medical goals  Care Tool:  Bathing    Body parts bathed by patient: Right arm, Left arm, Left upper leg, Chest, Abdomen, Front perineal area, Buttocks, Right upper leg, Face, Right lower leg, Left lower leg     Body parts n/a: Right lower leg, Left lower leg   Bathing assist Assist Level: Set up assist (  bed level)     Upper Body Dressing/Undressing Upper body dressing   What is the patient wearing?: Pull over shirt    Upper body assist Assist Level: Set up assist Assistive Device Comment: bed level  Lower Body Dressing/Undressing Lower body dressing      What is the patient wearing?: Pants, Underwear/pull up     Lower body assist Assist for lower body dressing: Set up assist (bed level)     Toileting Toileting    Toileting assist Assist for toileting: Independent with assistive device Assistive Device Comment: urinal   Transfers Chair/bed transfer  Transfers assist  Chair/bed transfer activity did not occur: Safety/medical concerns (unable to perform due to radiating back pain)  Chair/bed transfer assist level: Minimal Assistance - Patient > 75%     Locomotion Ambulation   Ambulation assist   Ambulation activity did not occur: Safety/medical concerns (unable to perform due to radiating back pain)  Assist level: Contact Guard/Touching assist Assistive device: Walker-rolling Max  distance: 160   Walk 10 feet activity   Assist  Walk 10 feet activity did not occur: Safety/medical concerns (unable to perform due to radiating back pain)  Assist level: Minimal Assistance - Patient > 75% Assistive device: Walker-rolling   Walk 50 feet activity   Assist Walk 50 feet with 2 turns activity did not occur: Safety/medical concerns (unable to perform due to radiating back pain)  Assist level: Minimal Assistance - Patient > 75% Assistive device: Walker-rolling    Walk 150 feet activity   Assist Walk 150 feet activity did not occur: Safety/medical concerns (unable to perform due to radiating back pain)  Assist level: Minimal Assistance - Patient > 75% Assistive device: Walker-rolling    Walk 10 feet on uneven surface  activity   Assist Walk 10 feet on uneven surfaces activity did not occur: Safety/medical concerns (unable to perform due to radiating back pain)         Wheelchair     Assist Is the patient using a wheelchair?: No   Wheelchair activity did not occur: Safety/medical concerns (unable to perform due to radiating back pain)         Wheelchair 50 feet with 2 turns activity    Assist    Wheelchair 50 feet with 2 turns activity did not occur: Safety/medical concerns (unable to perform due to radiating back pain)       Wheelchair 150 feet activity     Assist  Wheelchair 150 feet activity did not occur: Safety/medical concerns (unable to perform due to radiating back pain)       Blood pressure (!) 150/64, pulse 66, temperature 97.8 F (36.6 C), resp. rate 16, height _0  (1.753 m), weight 72.6 kg, SpO2 96 %.  Medical Problem List and Plan: 1. 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  Status post L2-3 disc aspiration per interventional radiology.  Blood cultures no growth to date.  Decadron 4 mg every 12 hours x2 days then 4 mg x 2 days and stop.             -patient may shower              -ELOS/Goals: 10-14 days  -Continue CIR therapies  -15/7 2.  Antithrombotics: -DVT/anticoagulation:  Mechanical: Antiembolism stockings, thigh (TED hose) Bilateral lower extremities             -antiplatelet therapy: none   3. Pain Management: Fentanyl patch/Duragesic patch, Neurontin 300 mg 3 times daily, Dilaudid 4  mg every 4 hours as needed pain, Robaxin as needed              - Zanaflex 4 mg TID instead of robaxin per pt preference              - Increase gabapentin to 600 mg TID for RLE neuropathic pain ; BUN/Cr stable 9/17              - Last dose Decadron 9/17              - Tried baclofen 10 mg; Dced d/t no effect              - Valium 2 mg Q6H PRN added, given relative spasm-free intervals following QHS clonazepam - wean as tolerated              - Dilaudid 1 mg IV given 1x for imaging; reinforced to patient no further IV. Note, some nursing concern to seeking behavior.              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 - Concern to complete ESI given possible discitis/osteomyelitis,   -9/19 increase gabapentin to 810m TID,  continue fentanyl 12 patch, increase zanaflex to 470mQID for muscle spasms  9/20 increase fentanyl to 2551m called NSGY to discuss  9/21 Fentanyl patch has fallen off, will replace, Spoke with NSGY, would hold off on injection due to concern of infection  9/22 will change zoloft to cymbalta 34m59mily  9/23 pt having issues with adherence of fentanyl patch   -will dc and try MS contin 34mg36m   -change tizanidine to q6 hours  9/24 pt feeling better with Ms Contin 34mg 64m-continue   -q6 tizanidine helped with sleep and spasms as well 4. Mood/Behavior/Sleep: Klonopin 0.25 mg nightly, Zoloft 50 mg daily             -antipsychotic agents: N/A             - DC klonopin (home med)  given PRN valium above; pt agreeable  -Continue valium 2mg Q629mRN  -appreciate neuropsych input 5. Neuropsych/cognition: This patient is capable of making decisions on his own behalf. 6. Skin/Wound Care: Routine skin checks 7. Fluids/Electrolytes/Nutrition: Routine in and outs with follow-up chemistries             - CMP stable 9/18 8.  ID/discitis/osteomyelitis.  Intravenous daptomycin and ceftriaxone through 12/25/2021 per infectious disease             - Disc cultures NGTD             - MRI lumbar spine 9/17 stable             - Weekly labs to ID, q2week ESR/CRP  -CRP 9/17 0.6 9.  Tremors.  Mysoline as directed 10.  Tobacco abuse.  Provide counseling 11.  GERD.  Protonix 40mg da55m12.  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  Follow-up outpatient 13. Low albumin. mild 3.4, encourage protein intake  -Added ensure, appears PO intake OK for some meals/ decreased other times 14. Constipation  -Last BM 9/22 improved, continue current medications   -may need to make some adjustments given opioid regimen  LOS: 8 days A FACE  TO FACE EVALUATION WAS PERFORMED  Meredith Staggers 11/26/2021, 8:33 AM

## 2021-11-26 NOTE — Progress Notes (Signed)
Physical Therapy Session Note  Patient Details  Name: Cameron [name] MRN: [id number] Date of Birth: 1951/03/03  Today's Date: 11/26/2021 PT Individual Time: 1100-1200 PT Individual Time Calculation (min): 60 min   Short Term Goals: Week 1:  PT Short Term Goal 1 (Week 1): STG=LTG due to ELOS  Skilled Therapeutic Interventions/Progress Updates:    pt received in bed and agreeable to therapy. Pt reports moderate to severe pain that worsens with mobility. premedicated. Rest and positioning provided as needed. Bed mobility with supervision and log roll technique. Pt with pain spasms throughout session that improve with rest. Pt ambulated with RW and CGA-supervision throughout session, up to 300 ft. Pt then navigated 6" steps with 2 handrails and CGA fading to supervision, x 12. After seated rest break, pt navigated 8" curb with RW and CGA with instructional cueing. Discussed d/c disposition, pt states he does not feel ready to manage at home. Discussed that might not be pain free at d/c but goal is to be able to manage with medication at home. Pt attempted nustep with supervision Stand pivot transfer, but was unable to tolerate position of seat. Pt then returned to room to lie down for several minutes to allow pain levels to decrease. Pt then participated in box stepping (forward, back, lateral) without UE support. Pt with small LOB x 2 requiring min A to recover, for balance and improved confidence. Pt returned to bed after session and was left with all needs in reach and alarm active.   Therapy Documentation Precautions:  Precautions Precautions: Fall, Back Precaution Comments: Back precautions for pain control (no formal order) Restrictions Weight Bearing Restrictions: No General:       Therapy/Group: Individual Therapy  Mickel Fuchs 11/26/2021, 11:42 AM

## 2021-11-26 NOTE — Progress Notes (Signed)
Occupational Therapy Session Note  Patient Details  Name: Cameron [name] MRN: [id number] Date of Birth: [birth year]  Today's Date: 11/26/2021 OT Individual Time: 0930-1015 OT Individual Time Calculation (min): 45 min    Short Term Goals: Week 2:  OT Short Term Goal 1 (Week 2): STG=LTG 2/2 ELOS  Skilled Therapeutic Interventions/Progress Updates:    Pt resting in bed upon arrival and agreeable to "giving it a try." Pt donned pants and socks at bed level. Pt amb with RW to sink and completed grooming tasks seated in w/c. Amb with RW in hallways (169', 243', and 324' with close supervision. No LOB or safety issues. Pt rested in w/c between each walk. Pt returned to bed. All needs within reach. Bed alarm activated.   Therapy Documentation Precautions:  Precautions Precautions: Fall, Back Precaution Comments: Back precautions for pain control (no formal order) Restrictions Weight Bearing Restrictions: No   Pain: Pain Assessment Pain Scale: 0-10 Pain Score: 6  Pain Type: Acute pain Pain Location: Back Pain Orientation: Lower Pain Descriptors / Indicators: Aching;Sharp Pain Frequency: Constant Pain Onset: Progressive Pain Intervention(s): Meds admin prior to therapy session   Therapy/Group: Individual Therapy  Leroy Libman 11/26/2021, 10:18 AM

## 2021-11-26 NOTE — Progress Notes (Signed)
Physical Therapy Session Note  Patient Details  Name: Cameron [name] MRN: [id number] Date of Birth: 1950-12-19  Today's Date: 11/26/2021 PT Individual Time: 1345-1425 PT Individual Time Calculation (min): 40 min   Short Term Goals: Week 1:  PT Short Term Goal 1 (Week 1): STG=LTG due to ELOS  Skilled Therapeutic Interventions/Progress Updates:    Pt received supine in bed, agreeable to PT session. Pt reports 4/10 pain in his low back and down into his RLE at rest, increases with mobility. Utilized repositioning and rest breaks as needed for pain management. Bed mobility Supervision with use of bedrail. Sit to stand and transfers with RW at Supervision level during session. Ambulation 2 x 200 ft with RW with CGA for balance before onset of "burning" pain down into RLE that resolves at rest. Pt requires positioning in supine to rest and recover from onset of pain with standing activity, unable to tolerate sitting. Supine bridges x 5 reps, bridges with pillow between legs for hip add hold x 5 reps, with 5 sec hold each. Standing RLE resisted 6" step-taps with red theraband, 2 x 20 reps. Pt returned to bed at end of session, needs in reach, bed alarm in place, family present.  Therapy Documentation Precautions:  Precautions Precautions: Fall, Back Precaution Comments: Back precautions for pain control (no formal order) Restrictions Weight Bearing Restrictions: No      Therapy/Group: Individual Therapy   Excell Seltzer, PT, DPT, CSRS 11/26/2021, 2:37 PM

## 2021-11-27 DIAGNOSIS — M4640 Discitis, unspecified, site unspecified: Secondary | ICD-10-CM | POA: Diagnosis not present

## 2021-11-27 LAB — C-REACTIVE PROTEIN: CRP: 2.3 mg/dL — ABNORMAL HIGH (ref <1.0)

## 2021-11-27 LAB — CK: Total CK: 33 U/L — ABNORMAL LOW (ref 49–397)

## 2021-11-27 LAB — SEDIMENTATION RATE: Sed Rate: 20 mm/hr — ABNORMAL HIGH (ref 0–16)

## 2021-11-27 MED ORDER — GABAPENTIN 400 MG PO CAPS
1200.0000 mg | ORAL_CAPSULE | Freq: Three times a day (TID) | ORAL | Status: DC
  Administered 2021-11-27 – 2021-12-06 (×25): 1200 mg via ORAL
  Filled 2021-11-27 (×26): qty 3

## 2021-11-27 NOTE — Progress Notes (Signed)
PROGRESS NOTE   Subjective/Complaints:  Pt reports pain meds due at 8am and literally is counting the minutes- was 7:38 am when saw initially-  Duragesic didn't work.  Also reports new regimen much more helpful- only hurting bad last 1 hour or so.  LBM 2 days ago- no problems with bowels before and usually has loose stools.  R knee up to groin and down below knee real sensitive- will assess nerve pain regimen.     ROS:  Pt denies SOB, abd pain, CP, N/V/C/D, and vision changes  Objective:   No results found. No results for input(s): "WBC", "HGB", "HCT", "PLT" in the last 72 hours.  No results for input(s): "NA", "K", "CL", "CO2", "GLUCOSE", "BUN", "CREATININE", "CALCIUM" in the last 72 hours.   Intake/Output Summary (Last 24 hours) at 11/27/2021 1911 Last data filed at 11/27/2021 1738 Gross per 24 hour  Intake 354 ml  Output 3350 ml  Net -2996 ml        Physical Exam: Vital Signs Blood pressure 120/61, pulse 77, temperature 98.3 F (36.8 C), temperature source Oral, resp. rate 18, height _0  (1.753 m), weight 72.6 kg, SpO2 94 %.   General: awake, alert, appropriate, supine in bed- wincing and sweating a little due to pain;  NAD HENT: conjugate gaze; oropharynx moist CV: regular rate; no JVD Pulmonary: CTA B/L; no W/R/R- good air movement GI: soft, NT, ND, (+)BS Psychiatric: appropriate- but in obvious pain Neurological: Ox3 Psych: pleasant and cooperative  Skin: C/D/I. No apparent lesions. Back biopsy area healed.  HER:DEYC is tender with palpation.       No apparent deformity.      Strength:                RUE: 5/5 SA, 5/5 EF, 5/5 EE, 5/5 WE, 5/5 FF, 5/5 FA                 LUE: 5/5 SA, 5/5 EF, 5/5 EE, 5/5 WE, 5/5 FF, 5/5 FA                 RLE: 2/5 HF, 3/5 KE, 4/5 DF, 1/5 EHL (congenital), 4/5 PF  - limited by pain                 LLE:  5/5 HF, 5/5 KE, 5/5 DF, 5/5 EHL, 5/5 PF    Neurologic exam:    Cognition: Alert and oriented x 3. Normal insight and awareness. Intact Memory. Normal language and speech. Cranial nerve exam unremarkable  Sensation: Altered/sensitive to light touch over medial right knee, wrapping to R hip and back following L3 dermatome.--my exam is c/w this as well.  Allodynia in RLE Reflexes: decreased partially d/t BL LE  guarding.   Assessment/Plan: 1. Functional deficits which require 3+ hours per day of interdisciplinary therapy in a comprehensive inpatient rehab setting. Physiatrist is providing close team supervision and 24 hour management of active medical problems listed below. Physiatrist and rehab team continue to assess barriers to discharge/monitor patient progress toward functional and medical goals  Care Tool:  Bathing    Body parts bathed by patient: Right arm, Left arm, Left upper leg, Chest, Abdomen,  Front perineal area, Buttocks, Right upper leg, Face, Right lower leg, Left lower leg     Body parts n/a: Right lower leg, Left lower leg   Bathing assist Assist Level: Set up assist (bed level)     Upper Body Dressing/Undressing Upper body dressing   What is the patient wearing?: Pull over shirt    Upper body assist Assist Level: Set up assist Assistive Device Comment: bed level  Lower Body Dressing/Undressing Lower body dressing      What is the patient wearing?: Pants, Underwear/pull up     Lower body assist Assist for lower body dressing: Set up assist (bed level)     Toileting Toileting    Toileting assist Assist for toileting: Supervision/Verbal cueing Assistive Device Comment: urinal   Transfers Chair/bed transfer  Transfers assist  Chair/bed transfer activity did not occur: Safety/medical concerns (unable to perform due to radiating back pain)  Chair/bed transfer assist level: Minimal Assistance - Patient > 75%     Locomotion Ambulation   Ambulation assist   Ambulation activity did not occur: Safety/medical  concerns (unable to perform due to radiating back pain)  Assist level: Contact Guard/Touching assist Assistive device: Walker-rolling Max distance: 160   Walk 10 feet activity   Assist  Walk 10 feet activity did not occur: Safety/medical concerns (unable to perform due to radiating back pain)  Assist level: Minimal Assistance - Patient > 75% Assistive device: Walker-rolling   Walk 50 feet activity   Assist Walk 50 feet with 2 turns activity did not occur: Safety/medical concerns (unable to perform due to radiating back pain)  Assist level: Minimal Assistance - Patient > 75% Assistive device: Walker-rolling    Walk 150 feet activity   Assist Walk 150 feet activity did not occur: Safety/medical concerns (unable to perform due to radiating back pain)  Assist level: Minimal Assistance - Patient > 75% Assistive device: Walker-rolling    Walk 10 feet on uneven surface  activity   Assist Walk 10 feet on uneven surfaces activity did not occur: Safety/medical concerns (unable to perform due to radiating back pain)         Wheelchair     Assist Is the patient using a wheelchair?: No   Wheelchair activity did not occur: Safety/medical concerns (unable to perform due to radiating back pain)         Wheelchair 50 feet with 2 turns activity    Assist    Wheelchair 50 feet with 2 turns activity did not occur: Safety/medical concerns (unable to perform due to radiating back pain)       Wheelchair 150 feet activity     Assist  Wheelchair 150 feet activity did not occur: Safety/medical concerns (unable to perform due to radiating back pain)       Blood pressure 120/61, pulse 77, temperature 98.3 F (36.8 C), temperature source Oral, resp. rate 18, height $RemoveBe'5\' 9"'rsIIWLPNS$  (0.962 m), weight 72.6 kg, SpO2 94 %.  Medical Problem List and Plan: 1. 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  Status post L2-3 disc aspiration per  interventional radiology.  Blood cultures no growth to date.  Decadron 4 mg every 12 hours x2 days then 4 mg x 2 days and stop.             -patient may shower             -ELOS/Goals: 10-14 days  Con't CIR- PT and OT  -15/7 2.  Antithrombotics: -DVT/anticoagulation:  Mechanical: Antiembolism  stockings, thigh (TED hose) Bilateral lower extremities             -antiplatelet therapy: none   3. Pain Management: Fentanyl patch/Duragesic patch, Neurontin 300 mg 3 times daily, Dilaudid 4 mg every 4 hours as needed pain, Robaxin as needed              - Zanaflex 4 mg TID instead of robaxin per pt preference              - Increase gabapentin to 600 mg TID for RLE neuropathic pain ; BUN/Cr stable 9/17              - Last dose Decadron 9/17              - Tried baclofen 10 mg; Dced d/t no effect              - Valium 2 mg Q6H PRN added, given relative spasm-free intervals following QHS clonazepam - wean as tolerated              - Dilaudid 1 mg IV given 1x for imaging; reinforced to patient no further IV. Note, some nursing concern to seeking behavior.              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 - Concern to complete ESI given possible discitis/osteomyelitis,   -9/19 increase gabapentin to 835m TID,  continue fentanyl 12 patch, increase zanaflex to 4105mQID for muscle spasms  9/20 increase fentanyl to 2563m called NSGY to discuss  9/21 Fentanyl patch has fallen off, will replace, Spoke with NSGY, would hold off on injection due to concern of infection  9/22 will change zoloft to cymbalta 94m61mily  9/23 pt having issues with adherence of fentanyl patch   -will dc and try MS contin 94mg87m   -change tizanidine to q6 hours  9/24 pt feeling better with Ms Contin 94mg 53m-continue   -q6 tizanidine helped with sleep and  spasms as well  9/25-will increase gabapentin to 1200 mg TID- and cymbalta will increase to 60 mg on Friday-  4. Mood/Behavior/Sleep: Klonopin 0.25 mg nightly, Zoloft 50 mg daily             -antipsychotic agents: N/A             - DC klonopin (home med) given PRN valium above; pt agreeable  -Continue valium 2mg [id number]  -appreciate neuropsych input 5. Neuropsych/cognition: This patient is capable of making decisions on his own behalf. 6. Skin/Wound Care: Routine skin checks 7. Fluids/Electrolytes/Nutrition: Routine in and outs with follow-up chemistries             - CMP stable 9/18 8.  ID/discitis/osteomyelitis.  Intravenous daptomycin and ceftriaxone through 12/25/2021 per infectious disease             - Disc cultures NGTD             - MRI lumbar spine 9/17 stable             - Weekly labs to ID, q2week ESR/CRP  -CRP 9/17 0.6 9.  Tremors.  Mysoline as directed 10.  Tobacco abuse.  Provide counseling 11.  GERD.  Protonix 40mg [id number].  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  Follow-up outpatient 13. Low albumin. mild 3.4, encourage protein intake  -Added ensure, appears PO intake OK for some meals/ decreased other times 14. Constipation  -Last BM 9/22 improved, continue current medications   -may need to make some adjustments given opioid regimen  9/25- pt admits to hx of loose stools chronically- will monitor  I spent a total of  35  minutes on total care today- >50% coordination of care- due to d/w nursing and prolonged d/w pt about pain control.     LOS: 9 days A FACE TO FACE EVALUATION WAS PERFORMED  Florrie Ramires 11/27/2021, 7:11 PM

## 2021-11-27 NOTE — Progress Notes (Signed)
Physical Therapy Session Note  Patient Details  Name: Cameron [name] MRN: [id number] Date of Birth: February 19, 1951  Today's Date: 11/27/2021 PT Individual Time: 1330-1441 PT Individual Time Calculation (min): 71 min   Short Term Goals: Week 1:  PT Short Term Goal 1 (Week 1): STG=LTG due to ELOS   Skilled Therapeutic Interventions/Progress Updates:      Pt resting in bed with his wife at bedside - pt agreeable to PT tx. Reports 6/10 LBP - was premedicated prior to arrival. Supine<>sitting EOB mod I with hospital bed features. Pt requests to use restroom prior to leaving his room.   He Ambulates with CGA and no AD to toilet and he's continent of bladder while standing - x1 LOB to the R that was self corrected. Ambulated to the sink with CGA and washed his hands without LOB but began reporting "burning" pain in his RLE. HE requested to lie down briefly to allow the pain to subside - bed mobility completed mod I.   Ambulated with RW from his room to main rehab gym, >255f, with supervision - no instability or LOB, x1 brief standing rest break 2/2 pain. Pt requesting to lie on mat table to relieve his pain. Completed so independently. Offered patient moist hot pack but he declined.   Supine there-ex completed as follow: -knee fall outs L<>R to tolerance, 2x25 -knee to chest using swiss ball, 2x25 -lumbar rotations with heels on swiss ball, 2x25 -bridge with arms by side and heels on swiss ball, 2x10 -bridge walk outs, 2x5 *Rest breaks as needed for pain management and for recovery *PT guiding on sequencing, muscle activation, dosage during there-ex.  Pt ambulated back to his room, ~2067f RW and supervision assist. Cues for shoulder depression and loosening grip on RW.   Concluded session in bed with all needs met, wife at bedside.   Therapy Documentation Precautions:  Precautions Precautions: Fall, Back Precaution Comments: Back precautions for pain control (no formal  order) Restrictions Weight Bearing Restrictions: No General:     Therapy/Group: Individual Therapy  ChAlger Simons/25/2023, 7:52 AM

## 2021-11-27 NOTE — Progress Notes (Signed)
Occupational Therapy Session Note  Patient Details  Name: Cameron [name] MRN: [id number] Date of Birth: 10/01/1950  Today's Date: 11/27/2021 OT Individual Time: 1000-1056 OT Individual Time Calculation (min): 56 min    Short Term Goals: Week 2:  OT Short Term Goal 1 (Week 2): STG=LTG 2/2 ELOS  Skilled Therapeutic Interventions/Progress Updates:    Pt resting in bed upon arrival. Pt reports he has had "a rough morning" but ready to do "what it takes." Pt requested to use toilet. Pt amb with RW to bathroom with supervision. Toileting with supervision. After resting pt amb with RW to day room and rested. Pt returned to room. Total amb 202' x 2 with seated rest break. Pt returned to bed. Bed alarm activated. All needs within reach.   Therapy Documentation Precautions:  Precautions Precautions: Fall, Back Precaution Comments: Back precautions for pain control (no formal order) Restrictions Weight Bearing Restrictions: No   Pain: Pt reports 6/10 Rt hip and RLE pain with sporadic spasms; repostiioned and activity ADL: ADL Eating: Set up Grooming: Setup Where Assessed-Grooming: Bed level Upper Body Bathing: Setup Where Assessed-Upper Body Bathing: Bed level Lower Body Bathing: Minimal assistance Where Assessed-Lower Body Bathing: Bed level Upper Body Dressing: Setup Where Assessed-Upper Body Dressing: Bed level Lower Body Dressing: Supervision/safety Where Assessed-Lower Body Dressing: Bed level Toileting: Modified independent Where Assessed-Toileting: Bed level (urinal) Toilet Transfer: Not assessed Gaffer Transfer: Not assessed Vision   Perception    Praxis   Balance   Exercises:   Other Treatments:     Therapy/Group: Individual Therapy  Leroy Libman 11/27/2021, 11:04 AM

## 2021-11-28 DIAGNOSIS — M4640 Discitis, unspecified, site unspecified: Secondary | ICD-10-CM | POA: Diagnosis not present

## 2021-11-28 MED ORDER — SODIUM CHLORIDE 0.9 % IV SOLN: INTRAVENOUS | Status: DC | PRN

## 2021-11-28 NOTE — Progress Notes (Signed)
Occupational Therapy Session Note  Patient Details  Name: Cameron [name] MRN: [id number] Date of Birth: 1950-06-05  Today's Date: 11/28/2021 OT Individual Time: 0930-1000 OT Individual Time Calculation (min): 30 min  and Today's Date: 11/28/2021 OT Missed Time: 30 Minutes Missed Time Reason: Pain   Short Term Goals: Week 2:  OT Short Term Goal 1 (Week 2): STG=LTG 2/2 ELOS  Skilled Therapeutic Interventions/Progress Updates:    Pt resting in bed upon arrival. See pain below. Pt attempted to sit EOB x 4 in preparation for walking to bathroom to take a shower. Pt unable to tolerate sitting 2/2 increased pain in Rt hip. Pain relieved when supine but spasms continued. Pt unable to continue with therapy. Pt disappointed because he had been looking forward to taking a shower. Pt missed 30 mins skilled OT services. Will attempt to see pt as schedule allows.  Therapy Documentation Precautions:  Precautions Precautions: Fall, Back Precaution Comments: Back precautions for pain control (no formal order) Restrictions Weight Bearing Restrictions: No General: General OT Amount of Missed Time: 30 Minutes Pain: Pt with multiple spasms and increased pain with sitting EOB; repositioned   Therapy/Group: Individual Therapy  Leroy Libman 11/28/2021, 10:08 AM

## 2021-11-28 NOTE — Progress Notes (Signed)
Physical Therapy Note  Patient Details  Name: Cameron [name] MRN: [id number] Date of Birth: [birth year] Today's Date: 11/28/2021    Pt's plan of care adjusted to Q/D after speaking with care team and discussed with MD in team conference as pt currently unable to tolerate current therapy schedule with OT, PT, and SLP.     Mickel Fuchs 11/28/2021, 2:12 PM

## 2021-11-28 NOTE — Progress Notes (Signed)
PROGRESS NOTE   Subjective/Complaints:  Pt reports pain ~ 20% better- had better night with increase in gabapentin to 1200 mg TID. Pain just jolts him- doesn't know where got osteomyelitis- no cause that he's aware of but nephew had the same dx 3-4 months ago.   LBM yesterday- excited since was on toilet.  Scared we are going to send him home right now.   ROS:   Pt denies SOB, abd pain, CP, N/V/C/D, and vision changes Except for HPI  Objective:   No results found. No results for input(s): "WBC", "HGB", "HCT", "PLT" in the last 72 hours.  No results for input(s): "NA", "K", "CL", "CO2", "GLUCOSE", "BUN", "CREATININE", "CALCIUM" in the last 72 hours.   Intake/Output Summary (Last 24 hours) at 11/28/2021 0842 Last data filed at 11/28/2021 0817 Gross per 24 hour  Intake 354 ml  Output 3150 ml  Net -2796 ml        Physical Exam: Vital Signs Blood pressure 124/64, pulse 89, temperature 98.2 F (36.8 C), temperature source Oral, resp. rate 16, height _0  (1.753 m), weight 72.6 kg, SpO2 93 %.    General: awake, alert, appropriate, supine in bed; tolerating/less pain noted than yesterday-  NAD HENT: conjugate gaze; oropharynx moist CV: regular rate; no JVD Pulmonary: CTA B/L; no W/R/R- good air movement GI: soft, NT, ND, (+)BS- normoactive Psychiatric: appropriate- less wincing in pain/physical signs Neurological: Ox3  Skin: C/D/I. No apparent lesions. Back biopsy area healed.  WCH:ENID is tender with palpation.       No apparent deformity.      Strength:                RUE: 5/5 SA, 5/5 EF, 5/5 EE, 5/5 WE, 5/5 FF, 5/5 FA                 LUE: 5/5 SA, 5/5 EF, 5/5 EE, 5/5 WE, 5/5 FF, 5/5 FA                 RLE: 2/5 HF, 3/5 KE, 4/5 DF, 1/5 EHL (congenital), 4/5 PF  - limited by pain                 LLE:  5/5 HF, 5/5 KE, 5/5 DF, 5/5 EHL, 5/5 PF    Neurologic exam:   Cognition: Alert and oriented x 3. Normal insight  and awareness. Intact Memory. Normal language and speech. Cranial nerve exam unremarkable  Sensation: Altered/sensitive to light touch over medial right knee, wrapping to R hip and back following L3 dermatome.--my exam is c/w this as well.  Allodynia in RLE Reflexes: decreased partially d/t BL LE  guarding.   Assessment/Plan: 1. Functional deficits which require 3+ hours per day of interdisciplinary therapy in a comprehensive inpatient rehab setting. Physiatrist is providing close team supervision and 24 hour management of active medical problems listed below. Physiatrist and rehab team continue to assess barriers to discharge/monitor patient progress toward functional and medical goals  Care Tool:  Bathing    Body parts bathed by patient: Right arm, Left arm, Left upper leg, Chest, Abdomen, Front perineal area, Buttocks, Right upper leg, Face, Right lower leg, Left  lower leg     Body parts n/a: Right lower leg, Left lower leg   Bathing assist Assist Level: Set up assist (bed level)     Upper Body Dressing/Undressing Upper body dressing   What is the patient wearing?: Pull over shirt    Upper body assist Assist Level: Set up assist Assistive Device Comment: bed level  Lower Body Dressing/Undressing Lower body dressing      What is the patient wearing?: Pants, Underwear/pull up     Lower body assist Assist for lower body dressing: Set up assist (bed level)     Toileting Toileting    Toileting assist Assist for toileting: Supervision/Verbal cueing Assistive Device Comment: urinal   Transfers Chair/bed transfer  Transfers assist  Chair/bed transfer activity did not occur: Safety/medical concerns (unable to perform due to radiating back pain)  Chair/bed transfer assist level: Minimal Assistance - Patient > 75%     Locomotion Ambulation   Ambulation assist   Ambulation activity did not occur: Safety/medical concerns (unable to perform due to radiating back  pain)  Assist level: Contact Guard/Touching assist Assistive device: Walker-rolling Max distance: 160   Walk 10 feet activity   Assist  Walk 10 feet activity did not occur: Safety/medical concerns (unable to perform due to radiating back pain)  Assist level: Minimal Assistance - Patient > 75% Assistive device: Walker-rolling   Walk 50 feet activity   Assist Walk 50 feet with 2 turns activity did not occur: Safety/medical concerns (unable to perform due to radiating back pain)  Assist level: Minimal Assistance - Patient > 75% Assistive device: Walker-rolling    Walk 150 feet activity   Assist Walk 150 feet activity did not occur: Safety/medical concerns (unable to perform due to radiating back pain)  Assist level: Minimal Assistance - Patient > 75% Assistive device: Walker-rolling    Walk 10 feet on uneven surface  activity   Assist Walk 10 feet on uneven surfaces activity did not occur: Safety/medical concerns (unable to perform due to radiating back pain)         Wheelchair     Assist Is the patient using a wheelchair?: No   Wheelchair activity did not occur: Safety/medical concerns (unable to perform due to radiating back pain)         Wheelchair 50 feet with 2 turns activity    Assist    Wheelchair 50 feet with 2 turns activity did not occur: Safety/medical concerns (unable to perform due to radiating back pain)       Wheelchair 150 feet activity     Assist  Wheelchair 150 feet activity did not occur: Safety/medical concerns (unable to perform due to radiating back pain)       Blood pressure 124/64, pulse 89, temperature 98.2 F (36.8 C), temperature source Oral, resp. rate 16, height _0  (1.753 m), weight 72.6 kg, SpO2 93 %.  Medical Problem List and Plan: 1. 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  Status post L2-3 disc aspiration per interventional radiology.  Blood cultures no growth  to date.  Decadron 4 mg every 12 hours x2 days then 4 mg x 2 days and stop.             -patient may shower             -ELOS/Goals: 10-14 days  -15/7  Con't CIR- PT and OT  Team conference today to determine length of stay- might need longer than 14 days due to pain issues  2.  Antithrombotics: -DVT/anticoagulation:  Mechanical: Antiembolism stockings, thigh (TED hose) Bilateral lower extremities             -antiplatelet therapy: none   3. Pain Management: Fentanyl patch/Duragesic patch, Neurontin 300 mg 3 times daily, Dilaudid 4 mg every 4 hours as needed pain, Robaxin as needed              - Zanaflex 4 mg TID instead of robaxin per pt preference              - Increase gabapentin to 600 mg TID for RLE neuropathic pain ; BUN/Cr stable 9/17              - Last dose Decadron 9/17              - Tried baclofen 10 mg; Dced d/t no effect              - Valium 2 mg Q6H PRN added, given relative spasm-free intervals following QHS clonazepam - wean as tolerated              - Dilaudid 1 mg IV given 1x for imaging; reinforced to patient no further IV. Note, some nursing concern to seeking behavior.              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 - Concern to complete ESI given possible discitis/osteomyelitis,   -9/19 increase gabapentin to 859m TID,  continue fentanyl 12 patch, increase zanaflex to 459mQID for muscle spasms  9/20 increase fentanyl to 2527m called NSGY to discuss  9/21 Fentanyl patch has fallen off, will replace, Spoke with NSGY, would hold off on injection due to concern of infection  9/22 will change zoloft to cymbalta 71m72mily  9/23 pt having issues with adherence of fentanyl patch   -will dc and try MS contin 71mg76m   -change tizanidine to q6 hours  9/24 pt feeling better with Ms Contin 71mg 43m--continue   -q6 tizanidine helped with sleep and spasms as well  9/25-will increase gabapentin to 1200 mg TID- and cymbalta will increase to 60 mg on Friday-   9/26- pt only taken Dilaudid 3x in last 28+ hours- will encourage him to take more.  4. Mood/Behavior/Sleep: Klonopin 0.25 mg nightly, Zoloft 50 mg daily             -antipsychotic agents: N/A             - DC klonopin (home med) given PRN valium above; pt agreeable  -Continue valium 2mg Q632mRN  -appreciate neuropsych input 5. Neuropsych/cognition: This patient is capable of making decisions on his own behalf. 6. Skin/Wound Care: Routine skin checks 7. Fluids/Electrolytes/Nutrition: Routine in and outs with follow-up chemistries             - CMP stable 9/18 8.  ID/discitis/osteomyelitis.  Intravenous daptomycin and ceftriaxone through 12/25/2021 per infectious disease             - Disc cultures NGTD             - MRI lumbar spine 9/17 stable             - Weekly labs to ID, q2week ESR/CRP  -CRP 9/17 0.6 9.  Tremors.  Mysoline as directed 10.  Tobacco abuse.  Provide counseling 11.  GERD.  Protonix 73m daily 12.  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  Follow-up outpatient 13. Low albumin. mild 3.4, encourage protein intake  -Added ensure, appears PO intake OK for some meals/ decreased other times 14. Constipation  -Last BM 9/22 improved, continue current medications   -may need to make some adjustments given opioid regimen  9/25- pt admits to hx of loose stools chronically- will monitor  9/26- LBM yesterday denies constipation   I spent a total of  36  minutes on total care today- >50% coordination of care- due to team conference today and d/w pt x2 about needing to take meds more frequently.    LOS: 10 days A FACE TO FACE EVALUATION WAS PERFORMED  Donte Lenzo 11/28/2021, 8:42 AM

## 2021-11-28 NOTE — Patient Care Conference (Signed)
Inpatient RehabilitationTeam Conference and Plan of Care Update Date: 11/28/2021   Time: 11:10 AM    Patient Name: Cameron [name]      Medical Record Number: [id number]  Date of Birth: 17-May-1950 Sex: Male         Room/Bed: 4W02C/4W02C-01 Payor Info: Payor: MEDICARE / Plan: MEDICARE PART A / Product Type: *No Product type* /    Admit Date/Time:  11/18/2021  2:46 PM  Primary Diagnosis:  Discitis  Hospital Problems: Principal Problem:   Discitis    Expected Discharge Date: Expected Discharge Date: 12/04/21  Team Members Present: Physician leading conference: Dr. Courtney Heys Social Worker Present: Loralee Pacas, [place] Nurse Present: Other (comment) Tacy Learn, RN) PT Present: Ailene Rud, PT OT Present: Roanna Epley, COTA;Jennifer Tamala Julian, OT PPS Coordinator present : Gunnar Fusi, SLP     Current Status/Progress Goal Weekly Team Focus  Bowel/Bladder   continent x2. Last BM 9/25  Remain continent.  Monitor for changes in B/B function.   Swallow/Nutrition/ Hydration             ADL's   bathing at shower level-CGA; LB dressing-setup; toileting-CGA; functional transfers-CGA  mod I overall  OOB tolerance, transfers, education   Mobility   Supervision-mod I with mobility, limited by pain but ambulating up to 390 ft and navigated stairs with CGA  mod I overall  pain management   Communication             Safety/Cognition/ Behavioral Observations            Pain   Pain 6/10 on RLE. PRN meds given with relief.  Pain less than or equal to 2.  Assess pain q shift prn.   Skin   Picc RUE. Currently on Antibx. Skin CDI.  No skin breakdown.  Assess skin q shift/prn.     Discharge Planning:  Pt will d/c to home with his wife who is the primary caregiver. Pt on IV abx (dapto and ceftriaxone) until 10/23. [facility] and [facility] for HHPT/OT/SN.   Team Discussion: Discitis. Continent B/B. LBM 09/22. Pain is not well controlled.  Skin is CDI. PICC line for home ABT use. Therapy adjusted to 15/7 with 2 hours a day.  Patient on target to meet rehab goals: yes, patient on target at meeting goals with pain is controlled.   *See Care Plan and progress notes for long and short-term goals.   Revisions to Treatment Plan:  Medication adjustments  Teaching Needs: Medications, safety, gait/transfer training, etc.   Current Barriers to Discharge: Decreased caregiver support, Home enviroment access/layout, IV antibiotics, and Weight bearing restrictions  Possible Resolutions to Barriers: Family education, medication education, order recommended DME     Medical Summary Current Status: continent- B/B- still pain is out control- no wounds, skin issues; LBM yesterday- is already 15/7  Barriers to Discharge: Decreased family/caregiver support;Home enviroment access/layout;Medical stability;Other (comments);Weight bearing restrictions;IV antibiotics  Barriers to Discharge Comments: pain out of control- really limting therapy- tried to sit up 4x this Am- coudn't tolerate to sit >30 seconds. couldn't get shower since weekend. Possible Resolutions to [employer]: has done stairs, moves well when pain controlled-concern about increasing muscle relaxants- that will knock him out-limited by IV ABX til late 10/23- will try and switch 2 hours/day- 10/2 with goal to get pain better controlled- bigges tlimiter   Continued Need for Acute Rehabilitation Level of Care: The patient requires daily medical management by a physician with specialized training in  physical medicine and rehabilitation for the following reasons: Direction of a multidisciplinary physical rehabilitation program to maximize functional independence : Yes Medical management of patient stability for increased activity during participation in an intensive rehabilitation regime.: Yes Analysis of laboratory values and/or radiology reports with any subsequent need for  medication adjustment and/or medical intervention. : Yes   I attest that I was present, lead the team conference, and concur with the assessment and plan of the team.   Ernest Pine 11/28/2021, 2:39 PM

## 2021-11-28 NOTE — Progress Notes (Signed)
Physical Therapy Weekly Progress Note  Patient Details  Name: Cameron [name] MRN: [id number] Date of Birth: 01/28/1951  Beginning of progress report period: November 19, 2021 End of progress report period: November 28, 2021  Today's Date: 11/28/2021 PT Individual Time: 1130-1200, 1330-1430 PT Individual Time Calculation (min): 30 min, 60 min   Patient has met 1 of 1 short term goals.  Pt's biggest barrier to d/c is pain management. His pain levels fluctuate, and he has been able to ambulate up to 390 ft and navigate stairs with CGA, however, he limitedon pain so much at times that he is unable to sit EOB. D/c date set longer than initial ELOS d/t poor pain control and pt unable to consistently mobilize at mod I level.  Patient continues to demonstrate the following deficits muscle weakness and decreased balance strategies and therefore will continue to benefit from skilled PT intervention to increase functional independence with mobility.  Patient progressing toward long term goals..  Continue plan of care.  PT Short Term Goals Week 1:  PT Short Term Goal 1 (Week 1): STG=LTG due to ELOS PT Short Term Goal 1 - Progress (Week 1): Met Week 2:  PT Short Term Goal 1 (Week 2): =LTGs d/t ELOS  Skilled Therapeutic Interventions/Progress Updates:  Session 1: pt received in bed and agreeable to therapy. Pt reports 9.5/10 pain after attempting, premedicated. Rest and positioning provided as needed. Assisted pt with setting alarms to call for PRN pain medication to assist with managing pain. Supine>sit with bed features and supervision and stood to RW with CGA. Pt took ~3 steps and requested to return to bed d/t pain. Pt with extremely elevated pain levels requiring several minutes to ease. Pt was able to tolerate bridging  x 12 for strength and movement desensitization. Discussed slow exhales to stimulate parasympathetic system for pain management. Pt remained in bed at end of session and was left  with all needs in reach and alarm active.   Session 2: pt received in bed and agreeable to therapy. Pt reports 6/10 pain and requesting bed level exercise. premedicated. Rest and positioning provided as needed. Pt participated in ball taps with 5lb dowel 3 x ~20 taps for UB strength and endurance. Progressed to SLR with 7 lb weight on LLE, 3 x 10 and heel slides with weight 2 x 12. SAQ with LLE propped on bolster with 7lb ankle weight. Modified nerve glides with towel roll under RLE to tolerance, ~8 reps. Pt remained in bed with his wife present and all needs in reach.   Therapy Documentation Precautions:  Precautions Precautions: Fall, Back Precaution Comments: Back precautions for pain control (no formal order) Restrictions Weight Bearing Restrictions: No General:     Therapy/Group: Individual Therapy  Olivia C Brittain 11/28/2021, 12:14 PM  

## 2021-11-28 NOTE — Progress Notes (Signed)
Patient ID: Cameron [name], male   DOB: August 27, 1950, 71 y.o.   MRN: [id number]  SW met with pt and pt wife in room to provide updates from team conference, and d/c date 10/2. He is concerned about discharge date if he remains in pain. SW informed will have home infusion company follow-up.  SW spoke with Pam [facility] to discuss IV abx and d/c date. Reports she needs updated orders. She will schedule a time to come by and meet with pt and pt wife. SW updated [facility] on discharge date.    Loralee Pacas, MSW, [place] Office: [phone] Cell: [phone] Fax: [phone]

## 2021-11-29 DIAGNOSIS — M4640 Discitis, unspecified, site unspecified: Secondary | ICD-10-CM | POA: Diagnosis not present

## 2021-11-29 MED ORDER — DAPTOMYCIN IV (FOR PTA / DISCHARGE USE ONLY): 600.0000 mg | INTRAVENOUS | 0 refills | Status: DC

## 2021-11-29 MED ORDER — CEFTRIAXONE IV (FOR PTA / DISCHARGE USE ONLY): 2.0000 g | INTRAVENOUS | 0 refills | Status: DC

## 2021-11-29 MED ORDER — TIZANIDINE HCL 4 MG PO TABS
6.0000 mg | ORAL_TABLET | Freq: Four times a day (QID) | ORAL | Status: DC
  Administered 2021-11-29 – 2021-11-30 (×4): 6 mg via ORAL
  Filled 2021-11-29 (×4): qty 2

## 2021-11-29 NOTE — Progress Notes (Addendum)
Patient ID: Cameron [name], male   DOB: [birth year], 71 y.o.   MRN: [id number]  SW ordered 3in1 BSC and RW with [employer] via parachute.   SW left message for pt wife Cameron [name] to inform on items ordered, and encouraged follow-up if needed.  *SW received return phone call from pt wife Cameron [name] to discuss above. She also reports she was able to purchase a padded shower chair that will arrive to their home on Sunday. States he will also have a new bed arriving to the home on Friday. She is aware there will be follow-up to determine if he will discharge.   Loralee Pacas, MSW, [place] Office: [phone] Cell: [phone] Fax: [phone]

## 2021-11-29 NOTE — Progress Notes (Signed)
Physical Therapy Session Note  Patient Details  Name: Cameron [name] MRN: [id number] Date of Birth: Sep 06, 1950  Today's Date: 11/29/2021 PT Individual Time: 1400-1415 PT Individual Time Calculation (min): 15 min   Today's Date: 11/29/2021 PT Missed Time: 15 Minutes Missed Time Reason: Pain  Short Term Goals: Week 1:  PT Short Term Goal 1 (Week 1): STG=LTG due to ELOS PT Short Term Goal 1 - Progress (Week 1): Met Week 2:  PT Short Term Goal 1 (Week 2): =LTGs d/t ELOS  Skilled Therapeutic Interventions/Progress Updates:   Received pt semi-reclined in bed gripping onto bedrails and shaking from pain in low back. Pt reported pain 8/10 but reported being up to date on pain medications. With encouragement pt agreed to attempt OOB mobility. Donned slip on shoes in supine with supervision and using momentum, swung himself into sitting EOB but then immediately returned to supine due to pain stating "I can't do it". Educated pt on logroll technique and moving slower but pt politely declined attempting to sit EOB again. Attempted to reposition in L sidelying with pillow placed in between knees to relieve pain, however pt reported no relief. Rolled back into supine and concluded session with pt supine in bed with all needs within reach and 4 bedrails up per pt request. 15 minutes missed of skilled physical therapy due to pain.   Therapy Documentation Precautions:  Precautions Precautions: Fall, Back Precaution Comments: Back precautions for pain control (no formal order) Restrictions Weight Bearing Restrictions: No  Therapy/Group: Individual Therapy Alfonse Alpers PT, DPT  11/29/2021, 12:25 PM

## 2021-11-29 NOTE — Progress Notes (Signed)
PHARMACY CONSULT NOTE FOR:  OUTPATIENT  PARENTERAL ANTIBIOTIC THERAPY (OPAT), copied over from 11/15/21  Indication: Acute OM of lumbar spine Regimen:  - Daptomycin '600mg'$  IV daily - Ceftriaxone 2g IV daily End date: 12/25/20  IV antibiotic discharge orders are pended. To discharging provider:  please sign these orders via discharge navigator,  Select New Orders & click on the button choice - Manage This Unsigned Work.    Cameron [name] D. Mina Marble, PharmD, BCPS, [employer] 11/29/2021, 6:19 AM

## 2021-11-29 NOTE — Progress Notes (Signed)
Occupational Therapy Session Note  Patient Details  Name: Cameron [name] MRN: [id number] Date of Birth: 1950/06/07  Today's Date: 11/29/2021 OT Individual Time: 3832-9191 OT Individual Time Calculation (min): 26 min    Short Term Goals: Week 2:  OT Short Term Goal 1 (Week 2): STG=LTG 2/2 ELOS  Skilled Therapeutic Interventions/Progress Updates:    OT intervention with focus on dressing at bed level (pt unable to tolerate sitting EOB to complete task), sitting balance, and sit<>stand/standing at EOB. Pt with significant pain and spasms this morning deterring amb with RW or bathing at shower level. Pt unable to tolerate sitting EOB. Pt with ongoing RLE spasms when standing and returned to bed. Pt remained in bed with all needs within reach and bed alarm activated. Sit<>stand with supervision. Bed mobility with supervision.  Therapy Documentation Precautions:  Precautions Precautions: Fall, Back Precaution Comments: Back precautions for pain control (no formal order) Restrictions Weight Bearing Restrictions: No   Pain:   Pt reports 10/10 Rt hip and RLE pain with sporadic spasms; repositioned   Therapy/Group: Individual Therapy  Leroy Libman 11/29/2021, 9:57 AM

## 2021-11-29 NOTE — Progress Notes (Signed)
PROGRESS NOTE   Subjective/Complaints:  Pt said pain is doing better when takes prn pain meds- has set alarm to remember to take meds.   Still having a lot of muscle spasms.  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    ROS:   Pt denies SOB, abd pain, CP, N/V/C/D, and vision changes  Except for HPI  Objective:   No results found. No results for input(s): "WBC", "HGB", "HCT", "PLT" in the last 72 hours.  No results for input(s): "NA", "K", "CL", "CO2", "GLUCOSE", "BUN", "CREATININE", "CALCIUM" in the last 72 hours.   Intake/Output Summary (Last 24 hours) at 11/29/2021 0855 Last data filed at 11/29/2021 0617 Gross per 24 hour  Intake 1180 ml  Output 1875 ml  Net -695 ml        Physical Exam: Vital Signs Blood pressure 128/65, pulse 84, temperature 98.7 F (37.1 C), temperature source Oral, resp. rate 18, height 5' 9"  (1.753 m), weight 72.6 kg, SpO2 96 %.     General: awake, alert, appropriate, supine in bed; NAD HENT: conjugate gaze; oropharynx moist CV: regular rate; no JVD Pulmonary: CTA B/L; no W/R/R- good air movement GI: soft, NT, ND, (+)BS Psychiatric: appropriate- appears in less pain Neurological: Ox3- constant/q30 seconds muscle spasms jerking him around  Skin: C/D/I. No apparent lesions. Back biopsy area healed.  PNP:YYFR is tender with palpation.       No apparent deformity.      Strength:                RUE: 5/5 SA, 5/5 EF, 5/5 EE, 5/5 WE, 5/5 FF, 5/5 FA                 LUE: 5/5 SA, 5/5 EF, 5/5 EE, 5/5 WE, 5/5 FF, 5/5 FA                 RLE: 2/5 HF, 3/5 KE, 4/5 DF, 1/5 EHL (congenital), 4/5 PF  - limited by pain                 LLE:  5/5 HF, 5/5 KE, 5/5 DF, 5/5 EHL, 5/5 PF    Neurologic exam:   Cognition: Alert and oriented x 3. Normal insight and awareness. Intact Memory. Normal language and speech. Cranial nerve exam unremarkable  Sensation: Altered/sensitive to light touch over medial right knee,  wrapping to R hip and back following L3 dermatome.--my exam is c/w this as well.  Allodynia in RLE Reflexes: decreased partially d/t BL LE  guarding.   Assessment/Plan: 1. Functional deficits which require 3+ hours per day of interdisciplinary therapy in a comprehensive inpatient rehab setting. Physiatrist is providing close team supervision and 24 hour management of active medical problems listed below. Physiatrist and rehab team continue to assess barriers to discharge/monitor patient progress toward functional and medical goals  Care Tool:  Bathing    Body parts bathed by patient: Right arm, Left arm, Left upper leg, Chest, Abdomen, Front perineal area, Buttocks, Right upper leg, Face, Right lower leg, Left lower leg     Body parts n/a: Right lower leg, Left lower leg   Bathing assist Assist Level: Set up assist (bed  level)     Upper Body Dressing/Undressing Upper body dressing   What is the patient wearing?: Pull over shirt    Upper body assist Assist Level: Set up assist Assistive Device Comment: bed level  Lower Body Dressing/Undressing Lower body dressing      What is the patient wearing?: Pants, Underwear/pull up     Lower body assist Assist for lower body dressing: Set up assist (bed level)     Toileting Toileting    Toileting assist Assist for toileting: Supervision/Verbal cueing Assistive Device Comment: urinal   Transfers Chair/bed transfer  Transfers assist  Chair/bed transfer activity did not occur: Safety/medical concerns (unable to perform due to radiating back pain)  Chair/bed transfer assist level: Minimal Assistance - Patient > 75%     Locomotion Ambulation   Ambulation assist   Ambulation activity did not occur: Safety/medical concerns (unable to perform due to radiating back pain)  Assist level: Contact Guard/Touching assist Assistive device: Walker-rolling Max distance: 160   Walk 10 feet activity   Assist  Walk 10 feet  activity did not occur: Safety/medical concerns (unable to perform due to radiating back pain)  Assist level: Minimal Assistance - Patient > 75% Assistive device: Walker-rolling   Walk 50 feet activity   Assist Walk 50 feet with 2 turns activity did not occur: Safety/medical concerns (unable to perform due to radiating back pain)  Assist level: Minimal Assistance - Patient > 75% Assistive device: Walker-rolling    Walk 150 feet activity   Assist Walk 150 feet activity did not occur: Safety/medical concerns (unable to perform due to radiating back pain)  Assist level: Minimal Assistance - Patient > 75% Assistive device: Walker-rolling    Walk 10 feet on uneven surface  activity   Assist Walk 10 feet on uneven surfaces activity did not occur: Safety/medical concerns (unable to perform due to radiating back pain)         Wheelchair     Assist Is the patient using a wheelchair?: No   Wheelchair activity did not occur: Safety/medical concerns (unable to perform due to radiating back pain)         Wheelchair 50 feet with 2 turns activity    Assist    Wheelchair 50 feet with 2 turns activity did not occur: Safety/medical concerns (unable to perform due to radiating back pain)       Wheelchair 150 feet activity     Assist  Wheelchair 150 feet activity did not occur: Safety/medical concerns (unable to perform due to radiating back pain)       Blood pressure 128/65, pulse 84, temperature 98.7 F (37.1 C), temperature source Oral, resp. rate 18, height 5' 9"  (1.753 m), weight 72.6 kg, SpO2 96 %.  Medical Problem List and Plan: 1. 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  Status post L2-3 disc aspiration per interventional radiology.  Blood cultures no growth to date.  Decadron 4 mg every 12 hours x2 days then 4 mg x 2 days and stop.             -patient may shower             -ELOS/Goals: 10-14  days  -15/7  Con't CIR PT and OT- moved to 2 hours/day for tolerance.  D/c date Monday 10/2 2.  Antithrombotics: -DVT/anticoagulation:  Mechanical: Antiembolism stockings, thigh (TED hose) Bilateral lower extremities             -antiplatelet therapy: none   3. Pain  Management: Fentanyl patch/Duragesic patch, Neurontin 300 mg 3 times daily, Dilaudid 4 mg every 4 hours as needed pain, Robaxin as needed              - Zanaflex 4 mg TID instead of robaxin per pt preference              - Increase gabapentin to 600 mg TID for RLE neuropathic pain ; BUN/Cr stable 9/17              - Last dose Decadron 9/17              - Tried baclofen 10 mg; Dced d/t no effect              - Valium 2 mg Q6H PRN added, given relative spasm-free intervals following QHS clonazepam - wean as tolerated              - Dilaudid 1 mg IV given 1x for imaging; reinforced to patient no further IV. Note, some nursing concern to seeking behavior.              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 - Concern to complete ESI given possible discitis/osteomyelitis,   -9/19 increase gabapentin to 826m TID,  continue fentanyl 12 patch, increase zanaflex to 498mQID for muscle spasms  9/20 increase fentanyl to 2581m called NSGY to discuss  9/21 Fentanyl patch has fallen off, will replace, Spoke with NSGY, would hold off on injection due to concern of infection  9/22 will change zoloft to cymbalta 72m1mily  9/23 pt having issues with adherence of fentanyl patch   -will dc and try MS contin 72mg12m   -change tizanidine to q6 hours  9/24 pt feeling better with Ms Contin 72mg 15m-continue   -q6 tizanidine helped with sleep and spasms as well  9/25-will increase gabapentin to 1200 mg TID- and cymbalta will increase to 60 mg on Friday-   9/26- pt only taken  Dilaudid 3x in last 28+ hours- will encourage him to take more.   9/27- will increase Zanaflex 6 mg QID- and pt taking prn dilaudid more- doing somewhat better 4. Mood/Behavior/Sleep: Klonopin 0.25 mg nightly, Zoloft 50 mg daily             -antipsychotic agents: N/A             - DC klonopin (home med) given PRN valium above; pt agreeable  -Continue valium 2mg Q689mRN  -appreciate neuropsych input 5. Neuropsych/cognition: This patient is capable of making decisions on his own behalf. 6. Skin/Wound Care: Routine skin checks 7. Fluids/Electrolytes/Nutrition: Routine in and outs with follow-up chemistries             - CMP stable 9/18 8.  ID/discitis/osteomyelitis.  Intravenous daptomycin and ceftriaxone through 12/25/2021 per infectious disease             - Disc cultures NGTD             - MRI lumbar spine 9/17 stable             - Weekly labs to ID, q2week ESR/CRP  -CRP 9/17 0.6  9/27- CRP up to 2.3- will recheck Friday before he leaves to make sure not getting worse.  9.  Tremors.  Mysoline as directed 10.  Tobacco abuse.  Provide counseling 11.  GERD.  Protonix 86m daily 12.  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  Follow-up outpatient  9/27- having testicle phantom pain- hopefully will improve with gabapentin 13. Low albumin. mild 3.4, encourage protein intake  -Added ensure, appears PO intake OK for some meals/ decreased other times 14. Constipation  -Last BM 9/22 improved, continue current medications   -may need to make some adjustments given opioid regimen  9/25- pt admits to hx of loose stools chronically- will monitor  9/26- LBM yesterday denies constipation     LOS: 11 days A FACE TO FACE EVALUATION WAS PERFORMED  Cameron [name] 11/29/2021, 8:55 AM

## 2021-11-30 ENCOUNTER — Inpatient Hospital Stay (HOSPITAL_COMMUNITY): Payer: Medicare Other

## 2021-11-30 DIAGNOSIS — M4646 Discitis, unspecified, lumbar region: Secondary | ICD-10-CM | POA: Diagnosis not present

## 2021-11-30 DIAGNOSIS — M4640 Discitis, unspecified, site unspecified: Secondary | ICD-10-CM | POA: Diagnosis not present

## 2021-11-30 LAB — CBC WITH DIFFERENTIAL/PLATELET
Abs Immature Granulocytes: 0.04 10*3/uL (ref 0.00–0.07)
Basophils Absolute: 0 10*3/uL (ref 0.0–0.1)
Basophils Relative: 1 %
Eosinophils Absolute: 0.2 10*3/uL (ref 0.0–0.5)
Eosinophils Relative: 3 %
HCT: 25.9 % — ABNORMAL LOW (ref 39.0–52.0)
Hemoglobin: 8.9 g/dL — ABNORMAL LOW (ref 13.0–17.0)
Immature Granulocytes: 1 %
Lymphocytes Relative: 7 %
Lymphs Abs: 0.4 10*3/uL — ABNORMAL LOW (ref 0.7–4.0)
MCH: 32.8 pg (ref 26.0–34.0)
MCHC: 34.4 g/dL (ref 30.0–36.0)
MCV: 95.6 fL (ref 80.0–100.0)
Monocytes Absolute: 0.7 10*3/uL (ref 0.1–1.0)
Monocytes Relative: 10 %
Neutro Abs: 5.3 10*3/uL (ref 1.7–7.7)
Neutrophils Relative %: 78 %
Platelets: 143 10*3/uL — ABNORMAL LOW (ref 150–400)
RBC: 2.71 MIL/uL — ABNORMAL LOW (ref 4.22–5.81)
RDW: 12.4 % (ref 11.5–15.5)
WBC: 6.7 10*3/uL (ref 4.0–10.5)
nRBC: 0 % (ref 0.0–0.2)

## 2021-11-30 LAB — C-REACTIVE PROTEIN: CRP: 22.6 mg/dL — ABNORMAL HIGH (ref <1.0)

## 2021-11-30 LAB — RESP PANEL BY RT-PCR (FLU A&B, COVID) ARPGX2
Influenza A by PCR: NEGATIVE
Influenza B by PCR: NEGATIVE
SARS Coronavirus 2 by RT PCR: NEGATIVE

## 2021-11-30 LAB — URINALYSIS, ROUTINE W REFLEX MICROSCOPIC
Bacteria, UA: NONE SEEN
Bilirubin Urine: NEGATIVE
Glucose, UA: NEGATIVE mg/dL
Ketones, ur: NEGATIVE mg/dL
Leukocytes,Ua: NEGATIVE
Nitrite: NEGATIVE
Protein, ur: NEGATIVE mg/dL
Specific Gravity, Urine: 1.01 (ref 1.005–1.030)
pH: 6 (ref 5.0–8.0)

## 2021-11-30 LAB — COMPREHENSIVE METABOLIC PANEL
ALT: 53 U/L — ABNORMAL HIGH (ref 0–44)
AST: 59 U/L — ABNORMAL HIGH (ref 15–41)
Albumin: 2.8 g/dL — ABNORMAL LOW (ref 3.5–5.0)
Alkaline Phosphatase: 79 U/L (ref 38–126)
Anion gap: 13 (ref 5–15)
BUN: 13 mg/dL (ref 8–23)
CO2: 25 mmol/L (ref 22–32)
Calcium: 8.7 mg/dL — ABNORMAL LOW (ref 8.9–10.3)
Chloride: 94 mmol/L — ABNORMAL LOW (ref 98–111)
Creatinine, Ser: 1.12 mg/dL (ref 0.61–1.24)
GFR, Estimated: >60 mL/min (ref >60)
Glucose, Bld: 123 mg/dL — ABNORMAL HIGH (ref 70–99)
Potassium: 3.7 mmol/L (ref 3.5–5.1)
Sodium: 132 mmol/L — ABNORMAL LOW (ref 135–145)
Total Bilirubin: 0.4 mg/dL (ref 0.3–1.2)
Total Protein: 6.3 g/dL — ABNORMAL LOW (ref 6.5–8.1)

## 2021-11-30 LAB — CBC
HCT: 25.3 % — ABNORMAL LOW (ref 39.0–52.0)
Hemoglobin: 8.8 g/dL — ABNORMAL LOW (ref 13.0–17.0)
MCH: 32.7 pg (ref 26.0–34.0)
MCHC: 34.8 g/dL (ref 30.0–36.0)
MCV: 94.1 fL (ref 80.0–100.0)
Platelets: 218 10*3/uL (ref 150–400)
RBC: 2.69 MIL/uL — ABNORMAL LOW (ref 4.22–5.81)
RDW: 12.4 % (ref 11.5–15.5)
WBC: 9.3 10*3/uL (ref 4.0–10.5)
nRBC: 0 % (ref 0.0–0.2)

## 2021-11-30 LAB — CK: Total CK: 46 U/L — ABNORMAL LOW (ref 49–397)

## 2021-11-30 LAB — SEDIMENTATION RATE: Sed Rate: 26 mm/hr — ABNORMAL HIGH (ref 0–16)

## 2021-11-30 MED ORDER — TIZANIDINE HCL 4 MG PO TABS
4.0000 mg | ORAL_TABLET | Freq: Four times a day (QID) | ORAL | Status: DC
  Administered 2021-11-30 – 2021-12-06 (×22): 4 mg via ORAL
  Filled 2021-11-30 (×23): qty 1

## 2021-11-30 NOTE — Progress Notes (Signed)
Pt's wife states she is concerned about the amount of pain medication that pt is taking. States patient is "talking out of his head" and presenting very abnormal conversation.   Upon eval, pt able to answer all questions asked by nurse. Pt not given PRN dilaudid throughout shift per request of wife.

## 2021-11-30 NOTE — Progress Notes (Signed)
PROGRESS NOTE   Subjective/Complaints:  Said pain is worse "last 2 days"- sounds like pain meds were held last night due to confusion.   Mouth also really dry and feels drowsy- said feels somewhat sleepy/confused.   Said they couldn't wake him up yesterday- not clear when this occurred.  Slept OK.   ROS:  Limited by cognition  Except for HPI  Objective:   No results found. No results for input(s): "WBC", "HGB", "HCT", "PLT" in the last 72 hours.  No results for input(s): "NA", "K", "CL", "CO2", "GLUCOSE", "BUN", "CREATININE", "CALCIUM" in the last 72 hours.   Intake/Output Summary (Last 24 hours) at 11/30/2021 0849 Last data filed at 11/30/2021 0700 Gross per 24 hour  Intake 318 ml  Output 2935 ml  Net -2617 ml        Physical Exam: Vital Signs Blood pressure (!) 93/52, pulse 66, temperature 98 F (36.7 C), resp. rate 18, height _0  (1.753 m), weight 72.6 kg, SpO2 92 %.      General: awake, alert,slurring words- trying to be interactive; supine in bed;  NAD HENT: conjugate gaze; oropharynx moist CV: regular rate; no JVD Pulmonary: CTA B/L; no W/R/R- good air movement GI: soft, NT, ND, (+)BS Psychiatric: confused but trying to interact Neurological: confused- slurring words- decreased memory- delirium?; frequent, but less muscle spasms /jerking Skin: C/D/I. No apparent lesions. Back biopsy area healed.  VUD:THYH is tender with palpation.       No apparent deformity.      Strength:                RUE: 5/5 SA, 5/5 EF, 5/5 EE, 5/5 WE, 5/5 FF, 5/5 FA                 LUE: 5/5 SA, 5/5 EF, 5/5 EE, 5/5 WE, 5/5 FF, 5/5 FA                 RLE: 2/5 HF, 3/5 KE, 4/5 DF, 1/5 EHL (congenital), 4/5 PF  - limited by pain                 LLE:  5/5 HF, 5/5 KE, 5/5 DF, 5/5 EHL, 5/5 PF    Neurologic exam:   Cognition: Alert and oriented x 3. Normal insight and awareness. Intact Memory. Normal language and speech. Cranial  nerve exam unremarkable  Sensation: Altered/sensitive to light touch over medial right knee, wrapping to R hip and back following L3 dermatome.--my exam is c/w this as well.  Allodynia in RLE Reflexes: decreased partially d/t BL LE  guarding.   Assessment/Plan: 1. Functional deficits which require 3+ hours per day of interdisciplinary therapy in a comprehensive inpatient rehab setting. Physiatrist is providing close team supervision and 24 hour management of active medical problems listed below. Physiatrist and rehab team continue to assess barriers to discharge/monitor patient progress toward functional and medical goals  Care Tool:  Bathing    Body parts bathed by patient: Right arm, Left arm, Left upper leg, Chest, Abdomen, Front perineal area, Buttocks, Right upper leg, Face, Right lower leg, Left lower leg     Body parts n/a: Right lower leg, Left  lower leg   Bathing assist Assist Level: Set up assist (bed level)     Upper Body Dressing/Undressing Upper body dressing   What is the patient wearing?: Pull over shirt    Upper body assist Assist Level: Set up assist Assistive Device Comment: bed level  Lower Body Dressing/Undressing Lower body dressing      What is the patient wearing?: Pants, Underwear/pull up     Lower body assist Assist for lower body dressing: Set up assist (bed level)     Toileting Toileting    Toileting assist Assist for toileting: Supervision/Verbal cueing Assistive Device Comment: urinal   Transfers Chair/bed transfer  Transfers assist  Chair/bed transfer activity did not occur: Safety/medical concerns (unable to perform due to radiating back pain)  Chair/bed transfer assist level: Minimal Assistance - Patient > 75%     Locomotion Ambulation   Ambulation assist   Ambulation activity did not occur: Safety/medical concerns (unable to perform due to radiating back pain)  Assist level: Contact Guard/Touching assist Assistive device:  Walker-rolling Max distance: 160   Walk 10 feet activity   Assist  Walk 10 feet activity did not occur: Safety/medical concerns (unable to perform due to radiating back pain)  Assist level: Minimal Assistance - Patient > 75% Assistive device: Walker-rolling   Walk 50 feet activity   Assist Walk 50 feet with 2 turns activity did not occur: Safety/medical concerns (unable to perform due to radiating back pain)  Assist level: Minimal Assistance - Patient > 75% Assistive device: Walker-rolling    Walk 150 feet activity   Assist Walk 150 feet activity did not occur: Safety/medical concerns (unable to perform due to radiating back pain)  Assist level: Minimal Assistance - Patient > 75% Assistive device: Walker-rolling    Walk 10 feet on uneven surface  activity   Assist Walk 10 feet on uneven surfaces activity did not occur: Safety/medical concerns (unable to perform due to radiating back pain)         Wheelchair     Assist Is the patient using a wheelchair?: No   Wheelchair activity did not occur: Safety/medical concerns (unable to perform due to radiating back pain)         Wheelchair 50 feet with 2 turns activity    Assist    Wheelchair 50 feet with 2 turns activity did not occur: Safety/medical concerns (unable to perform due to radiating back pain)       Wheelchair 150 feet activity     Assist  Wheelchair 150 feet activity did not occur: Safety/medical concerns (unable to perform due to radiating back pain)       Blood pressure (!) 93/52, pulse 66, temperature 98 F (36.7 C), resp. rate 18, height _0  (1.753 m), weight 72.6 kg, SpO2 92 %.  Medical Problem List and Plan: 1. 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  Status post L2-3 disc aspiration per interventional radiology.  Blood cultures no growth to date.  Decadron 4 mg every 12 hours x2 days then 4 mg x 2 days and stop.              -patient may shower             -ELOS/Goals: 10-14 days   moved to 2 hours/day for tolerance.  Con't CIR PT and OT D/c date Monday 10/2 2.  Antithrombotics: -DVT/anticoagulation:  Mechanical: Antiembolism stockings, thigh (TED hose) Bilateral lower extremities             -  antiplatelet therapy: none   3. Pain Management: Fentanyl patch/Duragesic patch, Neurontin 300 mg 3 times daily, Dilaudid 4 mg every 4 hours as needed pain, Robaxin as needed              - Zanaflex 4 mg TID instead of robaxin per pt preference              - Increase gabapentin to 600 mg TID for RLE neuropathic pain ; BUN/Cr stable 9/17              - Last dose Decadron 9/17              - Tried baclofen 10 mg; Dced d/t no effect              - Valium 2 mg Q6H PRN added, given relative spasm-free intervals following QHS clonazepam - wean as tolerated              - Dilaudid 1 mg IV given 1x for imaging; reinforced to patient no further IV. Note, some nursing concern to seeking behavior.              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 - Concern to complete ESI given possible discitis/osteomyelitis,   -9/19 increase gabapentin to 828m TID,  continue fentanyl 12 patch, increase zanaflex to 480mQID for muscle spasms  9/20 increase fentanyl to 252m called NSGY to discuss  9/21 Fentanyl patch has fallen off, will replace, Spoke with NSGY, would hold off on injection due to concern of infection  9/22 will change zoloft to cymbalta 19m31mily  9/23 pt having issues with adherence of fentanyl patch   -will dc and try MS contin 19mg69m   -change tizanidine to q6 hours  9/24 pt feeling better with Ms Contin 19mg 69m-continue   -q6 tizanidine helped with sleep and spasms as well  9/25-will increase gabapentin to 1200 mg TID- and cymbalta will increase  to 60 mg on Friday-   9/26- pt only taken Dilaudid 3x in last 28+ hours- will encourage him to take more.   9/27- will increase Zanaflex 6 mg QID- and pt taking prn dilaudid more- doing somewhat better  9/28- hasn't received any pain meds overnight- Zanaflex could be making him confused, so lowered back top previous dose.  4. Mood/Behavior/Sleep: Klonopin 0.25 mg nightly, Zoloft 50 mg daily             -antipsychotic agents: N/A             - DC klonopin (home med) given PRN valium above; pt agreeable  -Continue valium 2mg Q666mRN  -appreciate neuropsych input 5. Neuropsych/cognition: This patient is capable of making decisions on his own behalf. 6. Skin/Wound Care: Routine skin checks 7. Fluids/Electrolytes/Nutrition: Routine in and outs with follow-up chemistries             - CMP stable 9/18 8.  ID/discitis/osteomyelitis.  Intravenous daptomycin and ceftriaxone through 12/25/2021 per infectious disease             - Disc cultures NGTD             - MRI lumbar spine 9/17 stable             - Weekly labs  to ID, q2week ESR/CRP  -CRP 9/17 0.6  9/27- CRP up to 2.3- will recheck Friday before he leaves to make sure not getting worse.   9/28- will actually check today since confused 9.  Tremors.  Mysoline as directed 10.  Tobacco abuse.  Provide counseling 11.  GERD.  Protonix 38m daily 12.  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  Follow-up outpatient  9/27- having testicle phantom pain- hopefully will improve with gabapentin 13. Low albumin. mild 3.4, encourage protein intake  -Added ensure, appears PO intake OK for some meals/ decreased other times 14. Constipation  9/28- cannot find a BM recorded, but I know he was going- might have bene in therapy- will verify with nursing  15. Confusion  9/28- thinks it's due to Increase in Zanaflex done yesterday- will decrease back to 4 mg QID- if doesn't improve, will check U/A and Cx and other ID work up- he  tolerated pain meds prior, so think its a combination of those with zanaflex increase and possible infection? Since has low grade temp which is new and confused, will check u/A and Cx now and monitor- due to osteomyelitis, will also recheck all labs today   I spent a total of 52   minutes on total care today- >50% coordination of care- due to d/w nursing and handling possible UTI vs worsening osteo- also called wife.      LOS: 12 days A FACE TO FACE EVALUATION WAS PERFORMED  Cameron [name] 11/30/2021, 8:49 AM

## 2021-11-30 NOTE — Progress Notes (Signed)
Occupational Therapy Session Note  Patient Details  Name: Cameron [name] MRN: [id number] Date of Birth: Apr 17, 1950  Today's Date: 11/30/2021 OT Individual Time: 7185-5015 OT Individual Time Calculation (min): 15 min  and Today's Date: 11/30/2021 OT Missed Time: 15 Minutes Missed Time Reason: Pain   Short Term Goals: Week 2:  OT Short Term Goal 1 (Week 2): STG=LTG 2/2 ELOS  Skilled Therapeutic Interventions/Progress Updates:    Attempted to engage pt in bedside tasks but pt continues to report increased pain as noted below. Continuous spasms noted. Pt expressed frustration. Unable to sit EOB. Pt remained in bed with bed alarm activated. All needs within reach. Made plans to take shower tomorrow.   Therapy Documentation Precautions:  Precautions Precautions: Fall, Back Precaution Comments: Back precautions for pain control (no formal order) Restrictions Weight Bearing Restrictions: No General: General OT Amount of Missed Time: 15 Minutes   Pain: Pt reports 8/10 Rt hip and RLE pain with ongoing/sporadic spasms; emotional support, relaxation strategies, repositioned  Therapy/Group: Individual Therapy  Leroy Libman 11/30/2021, 10:02 AM

## 2021-11-30 NOTE — Discharge Summary (Signed)
Physician Discharge Summary  Patient ID: ALEKSANDER EDMISTON MRN: [id number] DOB/AGE: [birth year] 71 y.o.  Admit date: 11/18/2021 Discharge date: 12/06/2021  Discharge Diagnoses:  Principal Problem:   Discitis Active Problems:   Hyponatremia   Hypokalemia   Constipation   Delirium Pain management Mood stabilization Tremors Tobacco use GERD History of testicular cancer Constipation Pneumonia  Discharged Condition: Stable  Significant Diagnostic Studies: DG Chest Port 1 View  Result Date: 12/01/2021 CLINICAL DATA:  COPD EXAM: PORTABLE CHEST 1 VIEW COMPARISON:  CT chest May 15, 2017 FINDINGS: There is a peripheral opacity in the right upper lobe along the fissure. The heart size and mediastinal contours are within normal limits. No pleural effusion. No pneumothorax. The visualized skeletal structures are unremarkable. IMPRESSION: Peripheral focal airspace opacity in the right upper lobe is suspicious for infection. Electronically Signed   By: Marin Roberts M.D.   On: 12/01/2021 12:37   MR Lumbar Spine W Wo Contrast  Result Date: 12/01/2021 CLINICAL DATA:  Epidural abscess suspected EXAM: MRI LUMBAR SPINE WITHOUT AND WITH CONTRAST TECHNIQUE: Multiplanar and multiecho pulse sequences of the lumbar spine were obtained without and with intravenous contrast. CONTRAST:  7 mL Gadavist COMPARISON:  11/19/2021 FINDINGS: Evaluation is limited by motion. Segmentation: 5 lumbar-type vertebral bodies. Alignment: Redemonstrated scoliosis. Trace retrolisthesis L1 on L2 and L2 on L3. Vertebrae: At the L2-L3 disc space, no definite fluid is currently seen, although evaluation is somewhat limited by motion. Minimal endplate enhancement appear similar to prior. No progressive findings. Similar degenerative endplate marrow edema at L4-L5 with minimal associated enhancement. No acute fracture or suspicious osseous lesion. Conus medullaris and cauda equina: Conus extends to the L1 level. Conus and cauda equina  appear normal. No abnormal enhancement of the conus or cauda equina. No abnormal epidural enhancement. Paraspinal and other soft tissues: No acute finding. Disc levels: T12-L1: No significant disc bulge. No spinal canal stenosis or neural foraminal narrowing. L1-L2: Mild disc bulge. Mild facet arthropathy. No spinal canal stenosis or neural foraminal narrowing. L2-L3: Right eccentric disc height loss and disc osteophyte complex. Right-greater-than-left moderate facet arthropathy. Right-greater-than-left ligamentum flavum hypertrophy. Moderate spinal canal stenosis, mild left neural foraminal narrowing, and moderate to severe right neural foraminal narrowing, overall unchanged when accounting for motion. L3-L4: Mild disc bulge with right foraminal and extreme lateral protrusion, which contacts the exiting right L3 nerve. Mild-to-moderate facet arthropathy. No spinal canal stenosis. Mild bilateral neural foraminal narrowing, unchanged. L4-L5: Moderate disc bulge. Moderate facet arthropathy. Likely unchanged moderate spinal canal stenosis, although this level is particularly motion limited. Moderate to severe left and mild right neural foraminal narrowing, unchanged. L5-S1: No significant disc bulge. No spinal canal stenosis or neural foraminal narrowing. IMPRESSION: 1. Evaluation is limited by motion. Within this limitation, there is no definite evidence of discitis/osteomyelitis, with further decrease in fluid signal in the disc at L2-L3 and only minimal endplate enhancement, and no evidence of epidural abscess. 2. L2-L3 moderate spinal canal stenosis, moderate to severe right neural foraminal narrowing, and mild left neural foraminal narrowing. 3. L4-L5 moderate spinal canal stenosis, moderate to severe left neural foraminal narrowing, and mild right neural foraminal narrowing. 4. L3-L4 mild bilateral neural foraminal narrowing. Electronically Signed   By: Merilyn Baba M.D.   On: 12/01/2021 01:09   MR Lumbar  Spine W Wo Contrast  Result Date: 11/19/2021 CLINICAL DATA:  L2-L3 osteomyelitis discitis follow-up. EXAM: MRI LUMBAR SPINE WITHOUT AND WITH CONTRAST TECHNIQUE: Multiplanar and multiecho pulse sequences of the lumbar spine were obtained without  and with intravenous contrast. CONTRAST:  7 mL view a intravenous contrast. COMPARISON:  MRI lumbar spine dated November 11, 2021. FINDINGS: Segmentation:  Standard. Alignment: Unchanged dextroscoliosis similar trace retrolisthesis at L1-L2 and L2-L3. Vertebrae: Degree of fluid within the L2-L3 disc space has significantly decreased. Mild surrounding endplate enhancement is unchanged. No progressive findings. Similar degenerative endplate marrow edema at L4-L5. Conus medullaris and cauda equina: Conus extends to the L1 level. Conus and cauda equina appear normal. No abnormal intrathecal enhancement. Paraspinal and other soft tissues: Negative. No paravertebral inflammatory change. Disc levels: T12-L1:  Negative. L1-L2: Unchanged mild-to-moderate left eccentric disc bulging. No stenosis. L2-L3: Unchanged mild-to-moderate right eccentric disc bulging and endplate spurring with asymmetric right facet arthropathy and ligamentum flavum hypertrophy. Unchanged moderate spinal canal and right lateral recess stenosis. Unchanged moderate to severe right and mild left neuroforaminal stenosis. L3-L4: Unchanged mild disc bulging with superimposed right foraminal and far lateral disc protrusion displacing the exiting right L3 nerve root. Unchanged mild-to-moderate bilateral facet arthropathy. Unchanged mild bilateral neuroforaminal stenosis. No spinal canal stenosis. L4-L5: Unchanged moderate disc bulging with left far lateral disc osteophyte complex. Unchanged moderate left and mild right facet arthropathy. Unchanged moderate spinal canal and bilateral lateral recess stenosis. Unchanged moderate to severe left and mild right neuroforaminal stenosis. L5-S1: Unchanged small broad-based  posterior disc protrusion. No stenosis. IMPRESSION: 1. Decreased fluid within the L2-L3 disc space with unchanged mild surrounding endplate irregularity/enhancement. No progressive findings. This remains indeterminate for osteomyelitis-discitis versus degenerative change. Correlate with disc aspiration cultures. 2. Unchanged multilevel lumbar spondylosis as described above. Unchanged moderate spinal canal, right lateral recess, and moderate to severe right neuroforaminal stenosis at L2-L3. 3. Unchanged right foraminal and far lateral disc protrusion at L3-L4 displacing the exiting right L3 nerve root. 4. Unchanged moderate spinal canal and bilateral lateral recess stenosis at L4-L5 with moderate to severe left neuroforaminal stenosis. Electronically Signed   By: Titus Dubin M.D.   On: 11/19/2021 13:11   [place] EKG SITE RITE  Result Date: 11/15/2021 If Site Rite image not attached, placement could not be confirmed due to current cardiac rhythm.  IR LUMBAR DISC ASPIRATION W/IMG GUIDE  Result Date: 11/13/2021 INDICATION: 71 year old male with L2-L3 osteomyelitis discitis EXAM: Disc aspiration MEDICATIONS: None. ANESTHESIA/SEDATION: Fentanyl 100 mcg IV; Versed 2 mg IV Moderate Sedation Time:  15 minutes The patient's vital signs and level of consciousness were continuously monitored during the procedure by the interventional radiology nurse under my direct supervision. COMPLICATIONS: None immediate. PROCEDURE: Informed written consent was obtained from the patient after a thorough discussion of the procedural risks, benefits and alternatives. All questions were addressed. Maximal Sterile Barrier Technique was utilized including caps, mask, sterile gowns, sterile gloves, sterile drape, hand hygiene and skin antiseptic. A timeout was performed prior to the initiation of the procedure. A suitable skin entry site was selected and marked. The overlying skin was sterilely prepped and draped in the standard fashion  using Betadine. A 22 gauge spinal needle was then carefully advanced into the intervertebral disc at L2-L3 under fluoroscopic guidance. Aspiration was performed yielding 1 cc of serosanguineous fluid. This was sent for Gram stain and culture. IMPRESSION: Successful L2-L3 disc aspiration yielding 1 mL serosanguineous fluid. Electronically Signed   By: Jacqulynn Cadet M.D.   On: 11/13/2021 12:41   MR Lumbar Spine W Wo Contrast  Result Date: 11/11/2021 CLINICAL DATA:  Lumbar radiculopathy. Infection suspected. Progressive symptoms over the last week. EXAM: MRI LUMBAR SPINE WITHOUT AND WITH CONTRAST TECHNIQUE: Multiplanar and  multiecho pulse sequences of the lumbar spine were obtained without and with intravenous contrast. CONTRAST:  7.15m GADAVIST GADOBUTROL 1 MMOL/ML IV SOLN COMPARISON:  None Available. FINDINGS: Segmentation: 5 non rib-bearing lumbar type vertebral bodies are present. The lowest fully formed vertebral body is L5. Alignment: Slight retrolisthesis at L2-3 is worse on the left. No other significant listhesis present. Straightening of the normal lumbar lordosis is present. Levoconvex curvature is present at L2-3. Vertebrae: Sclerotic changes are present anteriorly on the right at L2-3. Fluid is present in the L2-3 disc space. There is some enhancement within the disc space and mild endplate enhancement at L2-3. Subtle enhancement of right paravertebral soft tissues is present as well. Edematous changes are present on the left at L4-5 with some enhancement. No enhancement is present in the disc space. Conus medullaris and cauda equina: Conus extends to the T12-L1 level. Conus and cauda equina appear normal. Paraspinal and other soft tissues: Limited imaging the abdomen is unremarkable. There is no significant adenopathy. No solid organ lesions are present. Disc levels: T12-L1: Negative. L1-2: A broad-based disc protrusion is present. Mild facet hypertrophy is noted. Mild foraminal stenosis is present  bilaterally. L2-3: Rightward disc protrusion results in mild right subarticular stenosis. Moderate to severe right foraminal stenosis is present. Moderate left foraminal stenosis is present. L3-4: A broad-based disc protrusion is present. Moderate facet hypertrophy is present bilaterally. Central canal is patent. Moderate left and mild right foraminal narrowing is present. L4-5: A broad-based disc protrusion is present. Chronic loss of height is present on the left. Moderate to severe left subarticular and foraminal narrowing is present. Mild to moderate right subarticular and foraminal narrowing is present. L5-S1: A shallow central disc protrusion is present. No significant stenosis is present. IMPRESSION: 1. Fluid in the L2-3 disc space with some enhancement within the disc space and mild endplate enhancement. This is concerning for early disc osteomyelitis. 2. Mild right paravertebral soft tissue enhancement is present as well consistent with inflammatory change. No discrete abscess is present. 3. While there is no definite enhancement in the disc space, there is some left-sided endplate marrow edema and enhancement at L4-5. This most likely represents degenerative change in the setting of left-sided disease. 4. Mild foraminal narrowing bilaterally at L1-2. 5. Moderate to severe right and moderate left foraminal stenosis at L2-3. 6. Moderate left and mild right foraminal stenosis at L3-4. 7. Moderate to severe left subarticular and foraminal stenosis at L4-5. 8. Mild to moderate right subarticular and foraminal narrowing at L4-5. 9. Shallow central disc protrusion at L5-S1 without significant stenosis. These results were called by telephone at the time of interpretation on 11/11/2021 at 8:34 pm to provider JJolayne Panther PA, who verbally acknowledged these results. Electronically Signed   By: CSan MorelleM.D.   On: 11/11/2021 20:34    Labs:  Basic Metabolic Panel: Recent Labs  Lab 11/30/21 1124  12/01/21 0346 12/02/21 1049 12/03/21 0637  NA 132* 130* 131* 131*  K 3.7 3.3* 4.2 4.0  CL 94* 94* 94* 94*  CO2 '25 28 28 28  '$ GLUCOSE 123* 112* 104* 108*  BUN '13 11 10 '$ 7*  CREATININE 1.12 1.10 0.98 0.93  CALCIUM 8.7* 8.5* 8.9 8.7*    CBC: Recent Labs  Lab 11/30/21 1124 11/30/21 1441 12/01/21 0346 12/02/21 1049 12/03/21 0637  WBC 6.7   < > 6.9 7.8 5.4  NEUTROABS 5.3  --   --  5.4  --   HGB 8.9*   < > 10.5* 11.1*  10.3*  HCT 25.9*   < > 30.9* 31.5* 30.0*  MCV 95.6   < > 94.8 92.4 94.0  PLT 143*   < > 177 213 208   < > = values in this interval not displayed.    CBG: No results for input(s): "GLUCAP" in the last 168 hours.  Family history.  Father with prostate cancer mother with CVA.  Sister with hypertension.  Denies any esophageal cancer or stomach cancer   Brief HPI:   ANIELLO CHRISTOPOULOS is a 71 y.o. right-handed male with history of testicular cancer status post resection orchiectomy 2005 with radiation therapy/BPH, anxiety, essential tremor maintained on Mysoline, tobacco use.  Per chart review lives with spouse.  Independent prior to admission.  Presented to [facility] 11/11/2021 with severe back pain that he experienced after splitting and stacking some wood that slowly progressed.  He initially did see the orthopedic clinic started on steroid and muscle relaxer.  Unfortunately pain continued to worsen to the point he could not ambulate with radiating pain to the right lower extremity.  MRI lumbar spine showed fluid in the L2-3 disc space with some enhancement within the disc space and mild endplate enhancement.  Concerning for early disc osteomyelitis/discitis.  Moderate to severe right and moderate left foraminal stenosis at L2-3.  Moderate left and mild right foraminal stenosis L3-4 as well as moderate to severe left subarticular and foraminal stenosis L4-5 and narrowing L4-5.  Admission chemistries unremarkable sedimentation rate of 1 urinalysis negative blood culture  no growth to date.  Underwent L2-3 disc aspiration per interventional radiology.  Aspiration culture currently with no growth so far.  Infectious disease placed on intravenous daptomycin and ceftriaxone x6 weeks.  He was completing a Decadron taper.  Therapy evaluations completed due to patient decreased functional mobility was admitted for a comprehensive rehab program.   Hospital Course: [facility] was admitted to rehab 11/18/2021 for inpatient therapies to consist of PT, ST and OT at least three hours five days a week. Past admission physiatrist, therapy team and rehab RN have worked together to provide customized collaborative inpatient rehab.  Pertaining to patient's intractable back pain/discitis/osteomyelitis L2-3.  Status post L2-3 disc aspiration per interventional radiology.  Decadron taper completed.  Followed by infectious disease maintained on daptomycin as well as ceftriaxone x6 weeks however patient was elevated CRP 22,600 as well as low-grade fever and infectious disease reconsulted.  MRI lumbar spine 9/17 stable and MRI again completed 11/30/2021 given the new onset of fever no definite evidence of infection with further decrease in fluid signal in the L2-3 disc space and only minimal endplate enhancement.  There was no evidence of epidural abscess.  Given lack of any other localizing symptoms and otherwise negative work-up his PICC line was discontinued and transition to oral antibiotics with Zyvox and will continue through 12/08/2021 and plan for follow-up MRI in approxi-4 weeks to assess for any interval changes that would suggest relapse infection requiring repeat initiation of antibiotics..  Pain management with the use of Valium as needed muscle spasms, Flector patch with scheduled Neurontin 1200 mg 3 times daily and MS Contin  decreased to15 mg every 12 hours as well as Cymbalta.  Dilaudid 4 mg every 4 hours as needed as well as Zanaflex reduced to 4 mg every 6 hours.  He was maintained  on Mysoline for history of tremors.  Mood stabilization with history of anxiety he was taken off of his Klonopin given as needed Valium and  patient was agreeable.  Noted some delirium confusion wax and wane felt to be medication induced.  History of tobacco use receiving counts regards to cessation of nicotine products.  Protonix for history of GERD.  Noted history of testicular cancer with orchiectomy 2005 radiation therapy follow-up outpatient.  Bouts of constipation resolved with laxative assistance.   Blood pressures were monitored on TID basis and controlled     Rehab course: During patient's stay in rehab weekly team conferences were held to monitor patient's progress, set goals and discuss barriers to discharge. At admission, patient required minimal guard sit to stand minimal assist side-lying to sitting mod assist sit to side-lying  Physical exam.  Blood pressure 143/67 pulse 66 temperature 98.1 respirations 18 oxygen saturations 95% room air Constitutional.  Complaints of back pain HEENT Head.  Normocephalic and atraumatic Eyes.  Pupils round and reactive to light no discharge without nystagmus Neck.  Supple nontender no JVD without thyromegaly Cardiac regular rate and rhythm without any extra sounds or murmur heard Abdomen.  Soft nontender positive bowel sounds without rebound Respiratory effort normal no respiratory distress without wheeze Musculoskeletal.  No apparent deformity Right upper extremity 5/5 SA, 5/5 DF, 5/5 EE, 5/5W EE, 5/5 FF, 5/5 FA Left upper extremity 5/5 Right lower extremity 3/5 HF 3/5 KE 5/5 DF 1/5 EHL 5/5 PF limited by pain Left lower extremity 5/5 HF 5/5 KE 5/5 DF 5/5 EHL 5/5 PF Neurologic.  Alert oriented x3 Sensation.  Altered sensitive to light touch over medial right knee extending along inner right thigh otherwise intact    He/She  has had improvement in activity tolerance, balance, postural control as well as ability to compensate for deficits.  He/She has had improvement in functional use RUE/LUE  and RLE/LLE as well as improvement in awareness.  Donned slip on shoes with supervision.  Ambulates 390 feet contact-guard assist.  Supine to sit with bed features and supervision to stand.  Ambulates to the bathroom with rolling walker.  Bed mobility with supervision.  Full family teaching completed plan discharge to home       Disposition: Discharge to home    Diet: Regular  Special Instructions: No driving smoking or alcohol  Follow-up with infectious disease on plan follow-up MRI as outpatient.  Medications at discharge 1.  Tylenol as needed 2.  Zyvox 600 mg every 12 hours through 12/08/2021 and stop 3.  Zanaflex 4 mg every 6 hours  4.  Valium 2 mg every 6 hours as needed muscle spasms 5.  Flector patch 1 patch administer over 12 hours 2 times daily 6.  Cymbalta 60 mg daily 7.  Neurontin 1200 mg p.o. 3 times daily 8.  Dilaudid 4 mg every 4 hours as needed severe pain 9.  MS Contin 15 mg p.o. every 12 hours 10.  Protonix 40 mg p.o. daily 11.  MiraLAX twice daily hold for loose stools 12.  Mysoline 100 mg daily at 1615 and 150 mg at 2000 p.o. twice daily 13.  Senokot S1 tablet p.o. twice daily   30-35 minutes were spent completing discharge summary and discharge planning     Discharge Instructions     Change dressing on IV access line weekly and PRN   Complete by: As directed          Follow-up Information     Lov[place], Jinny Blossom, MD Follow up.   Specialty: Physical Medicine and Rehabilitation Why: Office to call for appointment Contact information: [address]  [place], Andrew N, DO Follow up.   Specialties: Infectious Diseases, Internal Medicine Why: Follow-up 12/20/2021 at 3:30 PM Contact information: [address] [place] [address] [phone]                 Signed: Lavon Paganini [place] 12/06/2021, 5:24 AM

## 2021-11-30 NOTE — Progress Notes (Signed)
Physical Therapy Session Note  Patient Details  Name: Cameron [name] MRN: [id number] Date of Birth: Aug 20, 1950  Today's Date: 11/30/2021 PT Individual Time: 1145-1205 PT Individual Time Calculation (min): 20 min   Short Term Goals: Week 2:  PT Short Term Goal 1 (Week 2): =LTGs d/t ELOS  Skilled Therapeutic Interventions/Progress Updates: Pt presented in bed with IV nurse present changing dressing on PICC line. PTA returned once nsg completed with task. Pt agreeable to therapy although having consistent back pain/spasms. Pt rating 7/10 when not in acute spasm, rest breaks provided as needed during session. RN arrived to provide meds and present during part of session. Pt requesting to ambulate to bathroom for urinary void. Performed supine to sit with supervision and use of bed features. Performed Sit to standi with CGA and pt was able to ambulate to bathroom with RW and CGA. Pt was able to maintain standing and perform clothing management with CGA to have continent urinary void. As pt was pulling pants over hips pt appeared to have "frozen". Pt was unable to remove R hand from pants despite several cues from PTA. PTA ultimately removing hand and placing on RW. Pt was able to step backwards towards toilet to sit but required increased time to perform actual sit requiring minA. Once pt in sitting pt appeared more talkative and stated that he wasn't even aware that his hand was stuck in pants. RN present throughout episode with RN asking if any dizziness/nausea/lightheadedness with pt denying any symptoms. Pt was able to stand from toilet with use of wall rail and ambulate in same manner back to bed. Pt transferred to supine with supervision. Pt with notable spasms and after discussion pt requesting no additional ambulation. Pt repositioned to comfort and left with bed alarm on, call bell within reach and wife present.      Therapy Documentation Precautions:  Precautions Precautions: Fall,  Back Precaution Comments: Back precautions for pain control (no formal order) Restrictions Weight Bearing Restrictions: No General: PT Amount of Missed Time (min): 10 Minutes PT Missed Treatment Reason: Pain Vital Signs: Therapy Vitals Temp: (!) 101.3 F (38.5 C) Temp Source: Oral Pulse Rate: 87 Resp: 17 BP: 120/60 Patient Position (if appropriate): Lying Oxygen Therapy SpO2: 95 % O2 Device: Room Air Pain: Pain Assessment Pain Scale: 0-10 Pain Score: 8  Mobility:   Locomotion :    Trunk/Postural Assessment :    Balance:   Exercises:   Other Treatments:      Therapy/Group: Individual Therapy  [name] 11/30/2021, 12:31 PM

## 2021-11-30 NOTE — Progress Notes (Addendum)
[facility]  Date of Admission:  11/18/2021           Reason for visit: Follow up on discitis/OM  Current antibiotics: Daptomycin Ceftriaxone   ASSESSMENT:    71 y.o. male admitted with:  # Possible discitis/OM at L2-3 versus degenerative disease and radiculopathy # New onset fevers # Newly elevated ESR/CRP Patient initially presented with acute onset back pain after doing some work involving heavy lifting and splitting wood.  Initial MRI 11/11/21 concerning for early discitis/OM.  Blood cultures 11/11/21 were negative and disc space aspiration cultures 11/13/21 were also negative.  Dr West Bali had recommended 6-8 weeks of daptomycin and ceftriaxone via PICC line.  Repeat MRI on 11/19/21 with decreased fluid within L2-3 disc space with no progressive findings.  This was interpreted as indeterminate for discitis/OM vs degenerative change.  His initial inflammatory markers were normal and remained normal until 3 days ago when noted to have ESR of 20 and CRP 2.3.  Today, ESR is 26 and CRP 22.6.  He is also newly febrile to 101.3 but fever curve has been slowly creeping up the past 2 days. He had a PICC line placed 11/16/21 and otherwise has no indwelling lines or tubes.   # Anemia No reported bleeding although hemoglobin has gone from 13.7 on 11/20/21 down to 8.9 today.  I do note that, while negative for infection, his UA does now show moderate amount of hemoglobin and no RBC/hpf.  CK is normal 3 days ago.  # Thrombocytopenia  # Elevated LFTs  # Confusion -- this seems better today   RECOMMENDATIONS:    Continue with current antibiotics of ceftriaxone and daptomycin Check blood cultures to ensure no CRBSI Repeat MRI with contrast of the lumbar spine to ensure nothing has developed given new fevers Check COVID + Flu If the above are negative and fevers persist, would empirically remove his PICC line Not sure why his hemoglobin has dropped like it has.   Would consider work up for this if repeat CBC shows similar drop as could be a spurious lab draw Lab monitoring Will follow   Principal Problem:   Discitis    MEDICATIONS:    Scheduled Meds:  Chlorhexidine Gluconate Cloth  6 each Topical BID   diclofenac  1 patch Transdermal BID   [START ON 12/01/2021] DULoxetine  60 mg Oral Daily   feeding supplement  237 mL Oral BID BM   gabapentin  1,200 mg Oral TID   morphine  30 mg Oral Q12H   pantoprazole  40 mg Oral Daily   polyethylene glycol  17 g Oral BID   primidone  100 mg Oral Daily   And   primidone  150 mg Oral BID   senna-docusate  1 tablet Oral BID   tiZANidine  4 mg Oral Q6H   Continuous Infusions:  sodium chloride Stopped (11/30/21 0850)   cefTRIAXone (ROCEPHIN)  IV 2 g (11/30/21 1257)   DAPTOmycin (CUBICIN) 600 mg in sodium chloride 0.9 % IVPB Stopped (11/30/21 0851)   PRN Meds:.sodium chloride, acetaminophen, diazepam, HYDROmorphone, ondansetron **OR** ondansetron (ZOFRAN) IV, mouth rinse, sodium chloride flush  SUBJECTIVE:   24 hour events:  Asked to see patient whom remains in rehab  He was previously seen by Dr Jerilynn Mages.  Please see her initial consult note for full history Has been up in rehab since 9/16 Has been having ongoing back pain with follow up imaging done 11/19/21.  Imaging was relatively  indeterminate for infection vs degenerative changes Aspiration cultures and blood cultures were negative His inflammatory markers also were normal, now they are elevated Has PICC line Febrile now as well with some congestion WBC remains normal No other symptoms to localize infectious source Was confused last night per he and his wife.  This does seem improved today. He has chronic cough symptoms at baseline from smoking and this is relatively stable. His hemoglobin has gone from 13 --> 8 Platelets decreased LFTs mildly elevated  Review of Systems  All other systems reviewed and are negative.     OBJECTIVE:   Blood  pressure 103/64, pulse 88, temperature 99.6 F (37.6 C), temperature source Oral, resp. rate 15, height 5' 9"  (1.753 m), weight 72.6 kg, SpO2 (!) 87 %. Body mass index is 23.63 kg/m.  Physical Exam Constitutional:      General: He is not in acute distress.    Appearance: Normal appearance.  HENT:     Head: Normocephalic and atraumatic.  Eyes:     Extraocular Movements: Extraocular movements intact.     Conjunctiva/sclera: Conjunctivae normal.  Pulmonary:     Effort: Pulmonary effort is normal. No respiratory distress.  Abdominal:     General: There is no distension.     Palpations: Abdomen is soft.     Tenderness: There is no abdominal tenderness.  Musculoskeletal:        General: Tenderness present.     Cervical back: Normal range of motion and neck supple.     Right lower leg: No edema.     Left lower leg: No edema.     Comments: He has tenderness around his knee and thigh on the right.   His right UE PICC line looks fine and reports no issues with it.   Skin:    General: Skin is warm and dry.     Findings: No erythema or rash.  Neurological:     General: No focal deficit present.     Mental Status: He is alert and oriented to person, place, and time.     Cranial Nerves: No cranial nerve deficit.  Psychiatric:        Mood and Affect: Mood normal.        Behavior: Behavior normal.      Lab Results: Lab Results  Component Value Date   WBC 6.7 11/30/2021   HGB 8.9 (L) 11/30/2021   HCT 25.9 (L) 11/30/2021   MCV 95.6 11/30/2021   PLT 143 (L) 11/30/2021    Lab Results  Component Value Date   NA 132 (L) 11/30/2021   K 3.7 11/30/2021   CO2 25 11/30/2021   GLUCOSE 123 (H) 11/30/2021   BUN 13 11/30/2021   CREATININE 1.12 11/30/2021   CALCIUM 8.7 (L) 11/30/2021   GFRNONAA >60 11/30/2021   GFRAA >60 03/22/2017    Lab Results  Component Value Date   ALT 53 (H) 11/30/2021   AST 59 (H) 11/30/2021   ALKPHOS 79 11/30/2021   BILITOT 0.4 11/30/2021        Component Value Date/Time   CRP 22.6 (H) 11/30/2021 1124       Component Value Date/Time   ESRSEDRATE 26 (H) 11/30/2021 1124     I have reviewed the micro and lab results in Epic.  Imaging: No results found.   Imaging independently reviewed in Epic.    Raynelle Highland for Infectious Disease [facility] [phone] pager 11/30/2021, 2:13 PM  I have personally  spent 50 minutes involved in face-to-face and non-face-to-face activities for this patient on the day of the visit. Professional time spent includes the following activities: Preparing to see the patient (review of tests), Obtaining and/or reviewing separately obtained history (admission/discharge record), Performing a medically appropriate examination and/or evaluation , Ordering medications/tests/procedures, referring and communicating with other health care professionals, Documenting clinical information in the EMR, Independently interpreting results (not separately reported), Communicating results to the patient/family/caregiver, Counseling and educating the patient/family/caregiver and Care coordination (not separately reported).

## 2021-12-01 ENCOUNTER — Inpatient Hospital Stay (HOSPITAL_COMMUNITY): Payer: Medicare Other

## 2021-12-01 DIAGNOSIS — K5900 Constipation, unspecified: Secondary | ICD-10-CM

## 2021-12-01 DIAGNOSIS — E871 Hypo-osmolality and hyponatremia: Secondary | ICD-10-CM

## 2021-12-01 DIAGNOSIS — R410 Disorientation, unspecified: Secondary | ICD-10-CM

## 2021-12-01 DIAGNOSIS — E876 Hypokalemia: Secondary | ICD-10-CM

## 2021-12-01 LAB — CBC
HCT: 30.9 % — ABNORMAL LOW (ref 39.0–52.0)
Hemoglobin: 10.5 g/dL — ABNORMAL LOW (ref 13.0–17.0)
MCH: 32.2 pg (ref 26.0–34.0)
MCHC: 34 g/dL (ref 30.0–36.0)
MCV: 94.8 fL (ref 80.0–100.0)
Platelets: 177 10*3/uL (ref 150–400)
RBC: 3.26 MIL/uL — ABNORMAL LOW (ref 4.22–5.81)
RDW: 12.3 % (ref 11.5–15.5)
WBC: 6.9 10*3/uL (ref 4.0–10.5)
nRBC: 0 % (ref 0.0–0.2)

## 2021-12-01 LAB — COMPREHENSIVE METABOLIC PANEL
ALT: 47 U/L — ABNORMAL HIGH (ref 0–44)
AST: 47 U/L — ABNORMAL HIGH (ref 15–41)
Albumin: 2.5 g/dL — ABNORMAL LOW (ref 3.5–5.0)
Alkaline Phosphatase: 70 U/L (ref 38–126)
Anion gap: 8 (ref 5–15)
BUN: 11 mg/dL (ref 8–23)
CO2: 28 mmol/L (ref 22–32)
Calcium: 8.5 mg/dL — ABNORMAL LOW (ref 8.9–10.3)
Chloride: 94 mmol/L — ABNORMAL LOW (ref 98–111)
Creatinine, Ser: 1.1 mg/dL (ref 0.61–1.24)
GFR, Estimated: >60 mL/min (ref >60)
Glucose, Bld: 112 mg/dL — ABNORMAL HIGH (ref 70–99)
Potassium: 3.3 mmol/L — ABNORMAL LOW (ref 3.5–5.1)
Sodium: 130 mmol/L — ABNORMAL LOW (ref 135–145)
Total Bilirubin: 0.3 mg/dL (ref 0.3–1.2)
Total Protein: 5.8 g/dL — ABNORMAL LOW (ref 6.5–8.1)

## 2021-12-01 LAB — RESPIRATORY PANEL BY PCR

## 2021-12-01 MED ORDER — LINEZOLID 600 MG PO TABS
600.0000 mg | ORAL_TABLET | Freq: Two times a day (BID) | ORAL | Status: DC
  Administered 2021-12-01 – 2021-12-06 (×10): 600 mg via ORAL
  Filled 2021-12-01 (×10): qty 1

## 2021-12-01 MED ORDER — POTASSIUM CHLORIDE 20 MEQ PO PACK
20.0000 meq | PACK | Freq: Two times a day (BID) | ORAL | Status: AC
  Administered 2021-12-01 – 2021-12-02 (×2): 20 meq via ORAL
  Filled 2021-12-01 (×2): qty 1

## 2021-12-01 MED ORDER — GADOPICLENOL 0.5 MMOL/ML IV SOLN
7.0000 mL | Freq: Once | INTRAVENOUS | Status: AC | PRN
  Administered 2021-12-01: 7 mL via INTRAVENOUS

## 2021-12-01 MED ORDER — CEFADROXIL 500 MG PO CAPS
1000.0000 mg | ORAL_CAPSULE | Freq: Two times a day (BID) | ORAL | Status: DC
  Administered 2021-12-01 – 2021-12-04 (×7): 1000 mg via ORAL
  Filled 2021-12-01 (×7): qty 2

## 2021-12-01 MED ORDER — SODIUM CHLORIDE 1 G PO TABS
1.0000 g | ORAL_TABLET | Freq: Two times a day (BID) | ORAL | Status: DC
  Administered 2021-12-01 – 2021-12-06 (×10): 1 g via ORAL
  Filled 2021-12-01 (×10): qty 1

## 2021-12-01 NOTE — Progress Notes (Signed)
PROGRESS NOTE   Subjective/Complaints:  Reports pain is about the same as last few days. Continues to have reports some intermittent confusion especially when waking up at night. He is followed by ID. Had MRI/CXR completed this AM.  PICC line removed today.   ROS:  Limited by cognition  Except for HPI  Objective:   DG Chest Port 1 View  Result Date: 12/01/2021 CLINICAL DATA:  COPD EXAM: PORTABLE CHEST 1 VIEW COMPARISON:  CT chest May 15, 2017 FINDINGS: There is a peripheral opacity in the right upper lobe along the fissure. The heart size and mediastinal contours are within normal limits. No pleural effusion. No pneumothorax. The visualized skeletal structures are unremarkable. IMPRESSION: Peripheral focal airspace opacity in the right upper lobe is suspicious for infection. Electronically Signed   By: Marin Roberts M.D.   On: 12/01/2021 12:37   MR Lumbar Spine W Wo Contrast  Result Date: 12/01/2021 CLINICAL DATA:  Epidural abscess suspected EXAM: MRI LUMBAR SPINE WITHOUT AND WITH CONTRAST TECHNIQUE: Multiplanar and multiecho pulse sequences of the lumbar spine were obtained without and with intravenous contrast. CONTRAST:  7 mL Gadavist COMPARISON:  11/19/2021 FINDINGS: Evaluation is limited by motion. Segmentation: 5 lumbar-type vertebral bodies. Alignment: Redemonstrated scoliosis. Trace retrolisthesis L1 on L2 and L2 on L3. Vertebrae: At the L2-L3 disc space, no definite fluid is currently seen, although evaluation is somewhat limited by motion. Minimal endplate enhancement appear similar to prior. No progressive findings. Similar degenerative endplate marrow edema at L4-L5 with minimal associated enhancement. No acute fracture or suspicious osseous lesion. Conus medullaris and cauda equina: Conus extends to the L1 level. Conus and cauda equina appear normal. No abnormal enhancement of the conus or cauda equina. No abnormal epidural  enhancement. Paraspinal and other soft tissues: No acute finding. Disc levels: T12-L1: No significant disc bulge. No spinal canal stenosis or neural foraminal narrowing. L1-L2: Mild disc bulge. Mild facet arthropathy. No spinal canal stenosis or neural foraminal narrowing. L2-L3: Right eccentric disc height loss and disc osteophyte complex. Right-greater-than-left moderate facet arthropathy. Right-greater-than-left ligamentum flavum hypertrophy. Moderate spinal canal stenosis, mild left neural foraminal narrowing, and moderate to severe right neural foraminal narrowing, overall unchanged when accounting for motion. L3-L4: Mild disc bulge with right foraminal and extreme lateral protrusion, which contacts the exiting right L3 nerve. Mild-to-moderate facet arthropathy. No spinal canal stenosis. Mild bilateral neural foraminal narrowing, unchanged. L4-L5: Moderate disc bulge. Moderate facet arthropathy. Likely unchanged moderate spinal canal stenosis, although this level is particularly motion limited. Moderate to severe left and mild right neural foraminal narrowing, unchanged. L5-S1: No significant disc bulge. No spinal canal stenosis or neural foraminal narrowing. IMPRESSION: 1. Evaluation is limited by motion. Within this limitation, there is no definite evidence of discitis/osteomyelitis, with further decrease in fluid signal in the disc at L2-L3 and only minimal endplate enhancement, and no evidence of epidural abscess. 2. L2-L3 moderate spinal canal stenosis, moderate to severe right neural foraminal narrowing, and mild left neural foraminal narrowing. 3. L4-L5 moderate spinal canal stenosis, moderate to severe left neural foraminal narrowing, and mild right neural foraminal narrowing. 4. L3-L4 mild bilateral neural foraminal narrowing. Electronically Signed   By: Merilyn Baba  M.D.   On: 12/01/2021 01:09   Recent Labs    11/30/21 1441 12/01/21 0346  WBC 9.3 6.9  HGB 8.8* 10.5*  HCT 25.3* 30.9*  PLT 218  177    Recent Labs    11/30/21 1124 12/01/21 0346  NA 132* 130*  K 3.7 3.3*  CL 94* 94*  CO2 25 28  GLUCOSE 123* 112*  BUN 13 11  CREATININE 1.12 1.10  CALCIUM 8.7* 8.5*     Intake/Output Summary (Last 24 hours) at 12/01/2021 1338 Last data filed at 12/01/2021 0649 Gross per 24 hour  Intake 240 ml  Output 350 ml  Net -110 ml         Physical Exam: Vital Signs Blood pressure 132/60, pulse 94, temperature (!) 103 F (39.4 C), temperature source Oral, resp. rate 19, height $RemoveBe'5\' 9"'kWskCvYPa$  (1.753 m), weight 72.6 kg, SpO2 95 %.      General: awake, alert,  NAD HENT: conjugate gaze; oropharynx moist CV: regular rate; no JVD Pulmonary: CTA B/L; no W/R/R- good air movement GI: soft, NT, ND, (+)BS Psychiatric: pleasant Neurological: frequent, but less muscle spasms /jerking Skin: C/D/I. No apparent lesions. Back biopsy area healed.  ZYS:AYTK is tender with palpation.       No apparent deformity.      Strength:                RUE: 5/5 SA, 5/5 EF, 5/5 EE, 5/5 WE, 5/5 FF, 5/5 FA                 LUE: 5/5 SA, 5/5 EF, 5/5 EE, 5/5 WE, 5/5 FF, 5/5 FA                 RLE: 2/5 HF, 3/5 KE, 4/5 DF, 1/5 EHL (congenital), 4/5 PF  - limited by pain                 LLE:  5/5 HF, 5/5 KE, 5/5 DF, 5/5 EHL, 5/5 PF    Neurologic exam:   Cognition: Alert and oriented to person, time, [place].  Follows simple commands. Cranial nerve exam unremarkable  Sensation: Altered/sensitive to light touch over medial right knee, wrapping to R hip and back following L3 dermatome.--my exam is c/w this as well.  Allodynia in RLE Reflexes: decreased partially d/t BL LE  guarding.   Assessment/Plan: 1. Functional deficits which require 3+ hours per day of interdisciplinary therapy in a comprehensive inpatient rehab setting. Physiatrist is providing close team supervision and 24 hour management of active medical problems listed below. Physiatrist and rehab team continue to assess barriers to discharge/monitor  patient progress toward functional and medical goals  Care Tool:  Bathing    Body parts bathed by patient: Right arm, Left arm, Left upper leg, Chest, Abdomen, Front perineal area, Buttocks, Right upper leg, Face, Right lower leg, Left lower leg     Body parts n/a: Right lower leg, Left lower leg   Bathing assist Assist Level: Independent with assistive device     Upper Body Dressing/Undressing Upper body dressing   What is the patient wearing?: Pull over shirt    Upper body assist Assist Level: Independent Assistive Device Comment: bed level  Lower Body Dressing/Undressing Lower body dressing      What is the patient wearing?: Underwear/pull up, Pants     Lower body assist Assist for lower body dressing: Independent with assitive device     Toileting Toileting    Toileting assist Assist for  toileting: Independent with assistive device Assistive Device Comment: urinal   Transfers Chair/bed transfer  Transfers assist  Chair/bed transfer activity did not occur: Safety/medical concerns (unable to perform due to radiating back pain)  Chair/bed transfer assist level: Contact Guard/Touching assist     Locomotion Ambulation   Ambulation assist   Ambulation activity did not occur: Safety/medical concerns (unable to perform due to radiating back pain)  Assist level: Contact Guard/Touching assist Assistive device: Walker-rolling Max distance: 34f   Walk 10 feet activity   Assist  Walk 10 feet activity did not occur: Safety/medical concerns (unable to perform due to radiating back pain)  Assist level: Contact Guard/Touching assist Assistive device: Walker-rolling   Walk 50 feet activity   Assist Walk 50 feet with 2 turns activity did not occur: Safety/medical concerns  Assist level: Minimal Assistance - Patient > 75% Assistive device: Walker-rolling    Walk 150 feet activity   Assist Walk 150 feet activity did not occur: Safety/medical  concerns  Assist level: Minimal Assistance - Patient > 75% Assistive device: Walker-rolling    Walk 10 feet on uneven surface  activity   Assist Walk 10 feet on uneven surfaces activity did not occur: Safety/medical concerns         Wheelchair     Assist Is the patient using a wheelchair?: No   Wheelchair activity did not occur: Safety/medical concerns (unable to perform due to radiating back pain)         Wheelchair 50 feet with 2 turns activity    Assist    Wheelchair 50 feet with 2 turns activity did not occur: Safety/medical concerns (unable to perform due to radiating back pain)       Wheelchair 150 feet activity     Assist  Wheelchair 150 feet activity did not occur: Safety/medical concerns (unable to perform due to radiating back pain)       Blood pressure 132/60, pulse 94, temperature (!) 103 F (39.4 C), temperature source Oral, resp. rate 19, height 5' 9"  (1.753 m), weight 72.6 kg, SpO2 95 %.  Medical Problem List and Plan: 1. 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  Status post L2-3 disc aspiration per interventional radiology.  Blood cultures no growth to date.  Decadron 4 mg every 12 hours x2 days then 4 mg x 2 days and stop.             -patient may shower             -ELOS/Goals: 10-14 days   moved to 2 hours/day for tolerance.  Con't CIR PT and OT D/c date Monday 10/2 2.  Antithrombotics: -DVT/anticoagulation:  Mechanical: Antiembolism stockings, thigh (TED hose) Bilateral lower extremities             -antiplatelet therapy: none   3. Pain Management: Fentanyl patch/Duragesic patch, Neurontin 300 mg 3 times daily, Dilaudid 4 mg every 4 hours as needed pain, Robaxin as needed              - Zanaflex 4 mg TID instead of robaxin per pt preference              - Increase gabapentin to 600 mg TID for RLE neuropathic pain ; BUN/Cr stable 9/17              - Last dose Decadron 9/17              -  Tried baclofen 10 mg; Dced d/t no effect              -  Valium 2 mg Q6H PRN added, given relative spasm-free intervals following QHS clonazepam - wean as tolerated              - Dilaudid 1 mg IV given 1x for imaging; reinforced to patient no further IV. Note, some nursing concern to seeking behavior.              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 - Concern to complete ESI given possible discitis/osteomyelitis,   -9/19 increase gabapentin to 891m TID,  continue fentanyl 12 patch, increase zanaflex to 418mQID for muscle spasms  9/20 increase fentanyl to 2578m called NSGY to discuss  9/21 Fentanyl patch has fallen off, will replace, Spoke with NSGY, would hold off on injection due to concern of infection  9/22 will change zoloft to cymbalta 60m65mily  9/23 pt having issues with adherence of fentanyl patch   -will dc and try MS contin 60mg10m   -change tizanidine to q6 hours  9/24 pt feeling better with Ms Contin 60mg 110m-continue   -q6 tizanidine helped with sleep and spasms as well  9/25-will increase gabapentin to 1200 mg TID- and cymbalta will increase to 60 mg on Friday-   9/26- pt only taken Dilaudid 3x in last 28+ hours- will encourage him to take more.   9/27- will increase Zanaflex 6 mg QID- and pt taking prn dilaudid more- doing somewhat better  9/28- hasn't received any pain meds overnight- Zanaflex could be making him confused, so lowered back top previous dose.  4. Mood/Behavior/Sleep: Klonopin 0.25 mg nightly, Zoloft 50 mg daily             -antipsychotic agents: N/A             - DC klonopin (home med) given PRN valium above; pt agreeable  -Continue valium 2mg Q675mRN  -appreciate neuropsych input 5. Neuropsych/cognition: This patient is capable of making decisions on his own behalf. 6. Skin/Wound  Care: Routine skin checks 7. Fluids/Electrolytes/Nutrition: Routine in and outs with follow-up chemistries             - CMP stable 9/18 8.  ID/discitis/osteomyelitis.  Intravenous daptomycin and ceftriaxone through 12/25/2021 per infectious disease             - Disc cultures NGTD             - MRI lumbar spine 9/17 stable             - Weekly labs to ID, q2week ESR/CRP  -CRP 9/17 0.6  9/27- CRP up to 2.3- will recheck Friday before he leaves to make sure not getting worse.   9/28- will actually check today since confused  -9/29 ESR 26, CRP 22.6 yesterday, ID following- appreciate assistance, repeat MRI with no definitive evidence infection, WBC not elevated, U/A neg, PICC line removed today, ID starting oral linezolid 600mg BI32md cefadroxil 1g BID until 12/25/21  9.  Tremors.  Mysoline as directed 10.  Tobacco abuse.  Provide counseling 11.  GERD.  Protonix 40mg dai73m2.  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  Follow-up outpatient  9/27- having testicle phantom pain- hopefully will improve with gabapentin 13. Low albumin. mild 3.4, encourage protein intake  -Added ensure, appears  PO intake OK for some meals/ decreased other times 14. Constipation  9/28- cannot find a BM recorded, but I know he was going- might have bene in therapy- will verify with nursing   9/29 LBM yesterday-improved 15. Confusion  9/28- thinks it's due to Increase in Zanaflex done yesterday- will decrease back to 4 mg QID- if doesn't improve, will check U/A and Cx and other ID work up- he tolerated pain meds prior, so think its a combination of those with zanaflex increase and possible infection? Since has low grade temp which is new and confused, will check u/A and Cx now and monitor- due to osteomyelitis, will also recheck all labs today  9/29 continued to have episodes of confusion last night but appears to be improving 16. Hypokalemia/hyponatremia  -9/29 KCL 61mq x2, salt  tabs ordered, recheck tomorrow   LOS: 13 days A FACE TO FACE EVALUATION WAS PERFORMED  YJennye Boroughs9/29/2023, 1:38 PM

## 2021-12-01 NOTE — Progress Notes (Signed)
RA SL PICC removed per protocol per MD order. Vaseline gauze, gauze and tegaderm applied to site with manual pressure x 3 min. No bleeding or swelling noted. Instructed pt and family member to lay flat for 30 min.

## 2021-12-01 NOTE — Progress Notes (Signed)
Physical Therapy Session Note  Patient Details  Name: Cameron [name] MRN: [id number] Date of Birth: 1950-05-15  Today's Date: 12/01/2021      Short Term Goals: Week 2:  PT Short Term Goal 1 (Week 2): =LTGs d/t ELOS  Skilled Therapeutic Interventions/Progress Updates: Upon entry to room wife informed that PICC line was just removed (IV team just outside room). Per protocol to lay flat for 30 min. Pt missed 30 min skilled PT due to PICC line removal.      Therapy Documentation Precautions:  Precautions Precautions: Fall, Back Precaution Comments: Back precautions for pain control (no formal order) Restrictions Weight Bearing Restrictions: No General: PT Amount of Missed Time (min): 30 Minutes PT Missed Treatment Reason: Nursing care (PICC line removal) Vital Signs: Therapy Vitals Temp: (!) 103 F (39.4 C) Temp Source: Oral Pulse Rate: 94 Resp: 19 BP: 132/60 Oxygen Therapy SpO2: 95 % O2 Flow Rate (L/min): 2 L/min Pain:   Mobility:   Locomotion :    Trunk/Postural Assessment : Cervical Assessment Cervical Assessment: Within Functional Limits Thoracic Assessment Thoracic Assessment: Within Functional Limits Lumbar Assessment Lumbar Assessment: Exceptions to [facility] Postural Control Postural Control: Deficits on evaluation  Balance: Static Sitting Balance Static Sitting - Level of Assistance: 6: Modified independent (Device/Increase time) Dynamic Sitting Balance Dynamic Sitting - Balance Support: During functional activity Dynamic Sitting - Level of Assistance: 6: Modified independent (Device/Increase time) Exercises:   Other Treatments:      Therapy/Group: Individual Therapy  [name] 12/01/2021, 12:28 PM

## 2021-12-01 NOTE — Progress Notes (Addendum)
Occupational Therapy Discharge Summary  Patient Details  Name: Cameron [name] MRN: [id number] Date of Birth: April 20, 1950  Date of Discharge from OT service:December 03, 2021  Pt has demonstrated ongoing pain in lower back/right hip. Pt has met his goals of mod I; 9 out of 9 goals. When his pain is manageable pt is able to ambulate around his room/ to and from the bathroom mod I with the RW. Pt is able to perform basic ADLs at bed level with setup or at the shower level mod I. However when his pain level is high- he is unable to get out of bed due to inretractable pain and unable to tolerate sitting at EOB or standing. There are no more skilled OT needs at this time.    Reasons goals not met: n/a   Recommendation:  No f/u at this time.   Equipment: BSC  Reasons for discharge: treatment goals met  Patient/family agrees with progress made and goals achieved: Yes  OT Discharge Precautions/Restrictions    General OT Amount of Missed Time: 45 Minutes Vital Signs Therapy Vitals Temp: (!) 103 F (39.4 C) Temp Source: Oral Pulse Rate: 94 Resp: 19 BP: 132/60 Oxygen Therapy SpO2: 95 % O2 Flow Rate (L/min): 2 L/min Pain   ADL ADL Eating: Independent Grooming: Independent Where Assessed-Grooming: Bed level Upper Body Bathing: Modified independent Where Assessed-Upper Body Bathing: Bed level Lower Body Bathing: Modified independent Where Assessed-Lower Body Bathing: Bed level Upper Body Dressing: Independent Where Assessed-Upper Body Dressing: Bed level Lower Body Dressing: Modified independent Where Assessed-Lower Body Dressing: Bed level Toileting: Modified independent Where Assessed-Toileting: Bed level (urinal) Toilet Transfer: Modified independent Film/video editor: Modified independent Astronomer: Sales promotion account executive Baseline Vision/History: 0 No visual deficits Patient Visual Report: No change from baseline Vision Assessment?: No  apparent visual deficits Perception  Perception: Within Functional Limits Praxis Praxis: Intact Cognition Cognition Overall Cognitive Status: Within Functional Limits for tasks assessed Arousal/Alertness: Awake/alert Orientation Level: Person;Place;Situation Person: Oriented Place: Oriented Situation: Oriented Memory: Appears intact Awareness: Appears intact Problem Solving: Appears intact Safety/Judgment: Appears intact Sensation Sensation Light Touch: Appears Intact Hot/Cold: Appears Intact Proprioception: Appears Intact Coordination Gross Motor Movements are Fluid and Coordinated: No Fine Motor Movements are Fluid and Coordinated: No Motor  Motor Motor: Other (comment) Motor - Skilled Clinical Observations: significantly limited due to pain Mobility     Trunk/Postural Assessment  Cervical Assessment Cervical Assessment: Within Functional Limits Thoracic Assessment Thoracic Assessment: Within Functional Limits Lumbar Assessment Lumbar Assessment: Exceptions to [facility] Postural Control Postural Control: Deficits on evaluation  Balance Static Sitting Balance Static Sitting - Level of Assistance: 6: Modified independent (Device/Increase time) Dynamic Sitting Balance Dynamic Sitting - Balance Support: During functional activity Dynamic Sitting - Level of Assistance: 6: Modified independent (Device/Increase time) Extremity/Trunk Assessment RUE Assessment RUE Assessment: Within Functional Limits LUE Assessment LUE Assessment: Within Functional Limits   Rich Brave 12/01/2021, 12:28 PM

## 2021-12-01 NOTE — Progress Notes (Signed)
Occupational Therapy Note  Patient Details  Name: Cameron [name] MRN: [id number] Date of Birth: [birth year]  Today's Date: 12/01/2021 OT Missed Time: 43 Minutes Missed Time Reason: Pain;Other (comment) (delerium)  Pt in bed upon arrival with wife present. Per RN report, pt was delirious throughout the night. Pt's wife reported that she had to physically put him back to bed when he attempted to get OOB during the night. Pt conversation this morning tangential and often incoherent. Pt unable to participate in therapy.    Leotis Shames [facility] 12/01/2021, 10:15 AM

## 2021-12-01 NOTE — Progress Notes (Signed)
Cameron [name] for Infectious Disease  Date of Admission:  11/18/2021           Reason for visit: Follow up on discitis/osteomyelitis  Current antibiotics: Daptomycin Ceftriaxone    ASSESSMENT:    71 y.o. male admitted with:  #Possible discitis/osteomyelitis at L2-3 versus degenerative disease and radiculopathy #Fever, newly elevated ESR/CRP Patient initially presented with acute onset back pain after doing some work involving heavy lifting and splitting wood.  Initial MRI 11/11/21 concerning for early discitis/OM.  Blood cultures 11/11/21 were negative and disc space aspiration cultures 11/13/21 were also negative.  Dr West Bali had recommended 6-8 weeks of daptomycin and ceftriaxone via PICC line for empiric treatment.  Repeat MRI on 11/19/21 with decreased fluid within L2-3 disc space with no progressive findings.  This was interpreted as indeterminate for discitis/OM vs degenerative change.  His initial inflammatory markers were normal and remained normal until 4 days ago when noted to have ESR of 20 and CRP 2.3.  Yesterday, ESR is 26 and CRP 22.6.  He was also newly febrile to 101.3, but has been afebrile since that time. He had a PICC line placed 11/16/21 and otherwise has no indwelling lines or tubes. He has no other infectious/localizing symptoms.  A repeat MRI was obtained yesterday given the new onset fever which was most notable for no definite evidence of infection with further decrease in fluid signal in the L2-L3 disc space and only minimal endplate enhancement.  There was no evidence of epidural abscess.  #Confusion/delirium This seems to be waxing and waning somewhat.  Suspect in-hospital delirium and potentially related to changes in his pain medication and other agents that may affect his equilibrium.  Low suspicion for meningoencephalitis and would not consider LP at this time.  #Anemia No reported bleeding.  Hemoglobin improved today.  #Thrombocytopenia Platelets have  improved today.  #Elevated LFTs Improved today.  RECOMMENDATIONS:    Discussed at length today with patient and his wife.  I think initial diagnosis of discitis is relatively equivocal and may have been an MRI over read based on lack of infectious symptoms and normal inflammatory markers at presentation.  Follow-up MRIs have also been reassuring. Nonetheless, he has received almost 3 weeks of IV lead-in therapy for possible infection and I think transition to an oral regimen would be appropriate. Given lack of any other localizing symptoms and otherwise negative work-up, would be concerned for a PICC line infection. Recommend removal of PICC line and transitioning to oral antibiotics.  If IV access is needed for other purposes, then can place PIV. We will switch to oral linezolid 600 mg twice daily and cefadroxil 1 g twice daily. If blood counts do not tolerate linezolid, then can transition to doxycycline if needed. We will tentatively continue antibiotics through prior recommended end date of 12/25/2021 as outlined by Dr. Jerilynn Mages. Lab monitoring. Delirium precautions. Will follow.    Principal Problem:   Discitis    MEDICATIONS:    Scheduled Meds:  cefadroxil  1,000 mg Oral BID   Chlorhexidine Gluconate Cloth  6 each Topical BID   diclofenac  1 patch Transdermal BID   DULoxetine  60 mg Oral Daily   feeding supplement  237 mL Oral BID BM   gabapentin  1,200 mg Oral TID   linezolid  600 mg Oral Q12H   morphine  30 mg Oral Q12H   pantoprazole  40 mg Oral Daily   polyethylene glycol  17 g Oral BID  primidone  100 mg Oral Daily   And   primidone  150 mg Oral BID   senna-docusate  1 tablet Oral BID   tiZANidine  4 mg Oral Q6H   Continuous Infusions:  sodium chloride Stopped (11/30/21 0850)   PRN Meds:.sodium chloride, acetaminophen, diazepam, HYDROmorphone, ondansetron **OR** ondansetron (ZOFRAN) IV, mouth rinse, sodium chloride flush  SUBJECTIVE:   24 hour events:  No acute  events were noted overnight Patient remains afebrile following temperature of 101.3 yesterday at 10:30 AM This has included getting Tylenol at noon and 10 PM Blood cultures were obtained currently no growth WBC this morning remains normal, hemoglobin improved, platelets improved LFTs improved Creatinine stable Repeat MRI of the lumbar spine was limited by motion however there was no definitive evidence of discitis/osteomyelitis with continued further decrease in fluid signals in the disc at L2-3 and only minimal endplate enhancement.  No evidence of epidural abscess. COVID and flu PCR was negative No other new imaging besides MRI as noted above Vitals are relatively stable with 1 low oxygen reading of 87% yesterday afternoon but has otherwise been normal   Patient was seen at the bedside with his wife this morning.  They report ongoing episodes of confusion and delirium overnight and early this morning.  He continues to report back pain.  No other localizing symptoms.  The pain is relatively stable.  Review of Systems  All other systems reviewed and are negative.     OBJECTIVE:   Blood pressure (!) 104/51, pulse 63, temperature 98 F (36.7 C), temperature source Oral, resp. rate 18, height $RemoveBe'5\' 9"'bykHCsHfA$  (1.753 m), weight 72.6 kg, SpO2 96 %. Body mass index is 23.63 kg/m.  Physical Exam Constitutional:      General: He is not in acute distress.    Appearance: Normal appearance.  HENT:     Head: Normocephalic and atraumatic.  Eyes:     Extraocular Movements: Extraocular movements intact.     Conjunctiva/sclera: Conjunctivae normal.  Pulmonary:     Effort: Pulmonary effort is normal. No respiratory distress.  Abdominal:     General: There is no distension.     Palpations: Abdomen is soft.     Tenderness: There is no abdominal tenderness.  Musculoskeletal:        General: Tenderness present.     Cervical back: Normal range of motion and neck supple.     Right lower leg: No edema.      Left lower leg: No edema.  Skin:    General: Skin is warm and dry.     Findings: No rash.  Neurological:     General: No focal deficit present.     Mental Status: He is alert.     Cranial Nerves: No cranial nerve deficit.     Comments: Patient is alert and oriented to person and time/date.  He recognizes that he is at [facility] but thought he was in the emergency department at this time.  He answers some questions appropriately although does have some confusion/delirium ongoing.  Psychiatric:        Mood and Affect: Mood normal.        Behavior: Behavior normal.      Lab Results: Lab Results  Component Value Date   WBC 6.9 12/01/2021   HGB 10.5 (L) 12/01/2021   HCT 30.9 (L) 12/01/2021   MCV 94.8 12/01/2021   PLT 177 12/01/2021    Lab Results  Component Value Date   NA 130 (L) 12/01/2021  K 3.3 (L) 12/01/2021   CO2 28 12/01/2021   GLUCOSE 112 (H) 12/01/2021   BUN 11 12/01/2021   CREATININE 1.10 12/01/2021   CALCIUM 8.5 (L) 12/01/2021   GFRNONAA >60 12/01/2021   GFRAA >60 03/22/2017    Lab Results  Component Value Date   ALT 47 (H) 12/01/2021   AST 47 (H) 12/01/2021   ALKPHOS 70 12/01/2021   BILITOT 0.3 12/01/2021       Component Value Date/Time   CRP 22.6 (H) 11/30/2021 1124       Component Value Date/Time   ESRSEDRATE 26 (H) 11/30/2021 1124     I have reviewed the micro and lab results in Epic.  Imaging: MR Lumbar Spine W Wo Contrast  Result Date: 12/01/2021 CLINICAL DATA:  Epidural abscess suspected EXAM: MRI LUMBAR SPINE WITHOUT AND WITH CONTRAST TECHNIQUE: Multiplanar and multiecho pulse sequences of the lumbar spine were obtained without and with intravenous contrast. CONTRAST:  7 mL Gadavist COMPARISON:  11/19/2021 FINDINGS: Evaluation is limited by motion. Segmentation: 5 lumbar-type vertebral bodies. Alignment: Redemonstrated scoliosis. Trace retrolisthesis L1 on L2 and L2 on L3. Vertebrae: At the L2-L3 disc space, no definite fluid is  currently seen, although evaluation is somewhat limited by motion. Minimal endplate enhancement appear similar to prior. No progressive findings. Similar degenerative endplate marrow edema at L4-L5 with minimal associated enhancement. No acute fracture or suspicious osseous lesion. Conus medullaris and cauda equina: Conus extends to the L1 level. Conus and cauda equina appear normal. No abnormal enhancement of the conus or cauda equina. No abnormal epidural enhancement. Paraspinal and other soft tissues: No acute finding. Disc levels: T12-L1: No significant disc bulge. No spinal canal stenosis or neural foraminal narrowing. L1-L2: Mild disc bulge. Mild facet arthropathy. No spinal canal stenosis or neural foraminal narrowing. L2-L3: Right eccentric disc height loss and disc osteophyte complex. Right-greater-than-left moderate facet arthropathy. Right-greater-than-left ligamentum flavum hypertrophy. Moderate spinal canal stenosis, mild left neural foraminal narrowing, and moderate to severe right neural foraminal narrowing, overall unchanged when accounting for motion. L3-L4: Mild disc bulge with right foraminal and extreme lateral protrusion, which contacts the exiting right L3 nerve. Mild-to-moderate facet arthropathy. No spinal canal stenosis. Mild bilateral neural foraminal narrowing, unchanged. L4-L5: Moderate disc bulge. Moderate facet arthropathy. Likely unchanged moderate spinal canal stenosis, although this level is particularly motion limited. Moderate to severe left and mild right neural foraminal narrowing, unchanged. L5-S1: No significant disc bulge. No spinal canal stenosis or neural foraminal narrowing. IMPRESSION: 1. Evaluation is limited by motion. Within this limitation, there is no definite evidence of discitis/osteomyelitis, with further decrease in fluid signal in the disc at L2-L3 and only minimal endplate enhancement, and no evidence of epidural abscess. 2. L2-L3 moderate spinal canal stenosis,  moderate to severe right neural foraminal narrowing, and mild left neural foraminal narrowing. 3. L4-L5 moderate spinal canal stenosis, moderate to severe left neural foraminal narrowing, and mild right neural foraminal narrowing. 4. L3-L4 mild bilateral neural foraminal narrowing. Electronically Signed   By: Merilyn Baba M.D.   On: 12/01/2021 01:09     Imaging independently reviewed in Epic.    Raynelle Highland for Infectious [facility] [phone] pager 12/01/2021, 10:47 AM  I have personally spent 50 minutes involved in face-to-face and non-face-to-face activities for this patient on the day of the visit. Professional time spent includes the following activities: Preparing to see the patient (review of tests), Obtaining and/or reviewing separately obtained history (admission/discharge record), Performing a medically  appropriate examination and/or evaluation , Ordering medications/tests/procedures, referring and communicating with other health care professionals, Documenting clinical information in the EMR, Independently interpreting results (not separately reported), Communicating results to the patient/family/caregiver, Counseling and educating the patient/family/caregiver and Care coordination (not separately reported).

## 2021-12-02 LAB — CBC WITH DIFFERENTIAL/PLATELET
Abs Immature Granulocytes: 0.03 10*3/uL (ref 0.00–0.07)
Basophils Absolute: 0.1 10*3/uL (ref 0.0–0.1)
Basophils Relative: 1 %
Eosinophils Absolute: 0.5 10*3/uL (ref 0.0–0.5)
Eosinophils Relative: 6 %
HCT: 31.5 % — ABNORMAL LOW (ref 39.0–52.0)
Hemoglobin: 11.1 g/dL — ABNORMAL LOW (ref 13.0–17.0)
Immature Granulocytes: 0 %
Lymphocytes Relative: 8 %
Lymphs Abs: 0.6 10*3/uL — ABNORMAL LOW (ref 0.7–4.0)
MCH: 32.6 pg (ref 26.0–34.0)
MCHC: 35.2 g/dL (ref 30.0–36.0)
MCV: 92.4 fL (ref 80.0–100.0)
Monocytes Absolute: 1.2 10*3/uL — ABNORMAL HIGH (ref 0.1–1.0)
Monocytes Relative: 15 %
Neutro Abs: 5.4 10*3/uL (ref 1.7–7.7)
Neutrophils Relative %: 70 %
Platelets: 213 10*3/uL (ref 150–400)
RBC: 3.41 MIL/uL — ABNORMAL LOW (ref 4.22–5.81)
RDW: 12.4 % (ref 11.5–15.5)
WBC: 7.8 10*3/uL (ref 4.0–10.5)
nRBC: 0 % (ref 0.0–0.2)

## 2021-12-02 LAB — BASIC METABOLIC PANEL
Anion gap: 9 (ref 5–15)
BUN: 10 mg/dL (ref 8–23)
CO2: 28 mmol/L (ref 22–32)
Calcium: 8.9 mg/dL (ref 8.9–10.3)
Chloride: 94 mmol/L — ABNORMAL LOW (ref 98–111)
Creatinine, Ser: 0.98 mg/dL (ref 0.61–1.24)
GFR, Estimated: >60 mL/min (ref >60)
Glucose, Bld: 104 mg/dL — ABNORMAL HIGH (ref 70–99)
Potassium: 4.2 mmol/L (ref 3.5–5.1)
Sodium: 131 mmol/L — ABNORMAL LOW (ref 135–145)

## 2021-12-02 NOTE — Progress Notes (Addendum)
Infectious disease follow-up note  Patient was febrile yesterday afternoon at 12 and 2 PM with a Tmax of 103 F.  His PICC line has now been removed as of late yesterday morning.  Further work-up of his fevers included a chest x-ray which showed a peripheral focal airspace opacity in the right upper lobe.  This possibly represents some infection.  He really has no significant pulmonary symptoms and is saturating okay given his history of COPD on just 1 L of nasal cannula.  His wife yesterday reports no witnessed episodes of choking or aspiration.  Respiratory pathogen panel was obtained given his chest x-ray findings which was negative for any viral infection.  Additionally COVID and flu testing the day prior was also negative.  His blood cultures from the 28th remain no growth.  After his PICC line was removed he was transition to oral antibiotics for his possible spinal infection with linezolid and cefadroxil.  He currently has no indwelling lines.  There are no other new labs this morning.  Will continue with current antibiotics of linezolid p.o. and cefadroxil p.o.  Continue to monitor fever curve.  Continue to monitor blood cultures.  Overall his fevers over the last couple days is more more suspicious for possible PICC line infection which has now been removed.  ID will continue to follow.   ADDENDUM 11:13 AM Updated by rehab provider that patient has a new cough and staff reporting desaturations in the 80s with movement in the room.  This is on the background of his smoking history and ? COPD.  Given the CXR findings, pneumonia could be an explanation for his fevers as well.  Possibly he aspirated during episodes of confusion the past couple nights.  Either way, his current antibiotics should adequately cover for bacterial pneumonia, including an acute aspiration pneumonia.  Will follow.    Cameron [name] for Infectious Disease [facility] 12/02/2021, 8:26 AM

## 2021-12-02 NOTE — Progress Notes (Signed)
PROGRESS NOTE   Subjective/Complaints:  Nursing reporting desats into 80s with room mobility, placed oxygen on overnight, 1 L [place]. Seen this AM, patient endorsing fevers and nonproductive cough. Overall, feels back pain much improved, continues with area of pain/sensitivity on RLE inner knee.   ROS:   ROS: +fevers, chills, cough; denies N/V, abdominal pain, constipation, diarrhea, SOB,  chest pain, new weakness or paraesthesias.    Objective:   DG Chest Port 1 View  Result Date: 12/01/2021 CLINICAL DATA:  COPD EXAM: PORTABLE CHEST 1 VIEW COMPARISON:  CT chest May 15, 2017 FINDINGS: There is a peripheral opacity in the right upper lobe along the fissure. The heart size and mediastinal contours are within normal limits. No pleural effusion. No pneumothorax. The visualized skeletal structures are unremarkable. IMPRESSION: Peripheral focal airspace opacity in the right upper lobe is suspicious for infection. Electronically Signed   By: Marin Roberts M.D.   On: 12/01/2021 12:37   MR Lumbar Spine W Wo Contrast  Result Date: 12/01/2021 CLINICAL DATA:  Epidural abscess suspected EXAM: MRI LUMBAR SPINE WITHOUT AND WITH CONTRAST TECHNIQUE: Multiplanar and multiecho pulse sequences of the lumbar spine were obtained without and with intravenous contrast. CONTRAST:  7 mL Gadavist COMPARISON:  11/19/2021 FINDINGS: Evaluation is limited by motion. Segmentation: 5 lumbar-type vertebral bodies. Alignment: Redemonstrated scoliosis. Trace retrolisthesis L1 on L2 and L2 on L3. Vertebrae: At the L2-L3 disc space, no definite fluid is currently seen, although evaluation is somewhat limited by motion. Minimal endplate enhancement appear similar to prior. No progressive findings. Similar degenerative endplate marrow edema at L4-L5 with minimal associated enhancement. No acute fracture or suspicious osseous lesion. Conus medullaris and cauda equina: Conus extends  to the L1 level. Conus and cauda equina appear normal. No abnormal enhancement of the conus or cauda equina. No abnormal epidural enhancement. Paraspinal and other soft tissues: No acute finding. Disc levels: T12-L1: No significant disc bulge. No spinal canal stenosis or neural foraminal narrowing. L1-L2: Mild disc bulge. Mild facet arthropathy. No spinal canal stenosis or neural foraminal narrowing. L2-L3: Right eccentric disc height loss and disc osteophyte complex. Right-greater-than-left moderate facet arthropathy. Right-greater-than-left ligamentum flavum hypertrophy. Moderate spinal canal stenosis, mild left neural foraminal narrowing, and moderate to severe right neural foraminal narrowing, overall unchanged when accounting for motion. L3-L4: Mild disc bulge with right foraminal and extreme lateral protrusion, which contacts the exiting right L3 nerve. Mild-to-moderate facet arthropathy. No spinal canal stenosis. Mild bilateral neural foraminal narrowing, unchanged. L4-L5: Moderate disc bulge. Moderate facet arthropathy. Likely unchanged moderate spinal canal stenosis, although this level is particularly motion limited. Moderate to severe left and mild right neural foraminal narrowing, unchanged. L5-S1: No significant disc bulge. No spinal canal stenosis or neural foraminal narrowing. IMPRESSION: 1. Evaluation is limited by motion. Within this limitation, there is no definite evidence of discitis/osteomyelitis, with further decrease in fluid signal in the disc at L2-L3 and only minimal endplate enhancement, and no evidence of epidural abscess. 2. L2-L3 moderate spinal canal stenosis, moderate to severe right neural foraminal narrowing, and mild left neural foraminal narrowing. 3. L4-L5 moderate spinal canal stenosis, moderate to severe left neural foraminal narrowing, and mild right neural foraminal narrowing.  4. L3-L4 mild bilateral neural foraminal narrowing. Electronically Signed   By: Merilyn Baba M.D.    On: 12/01/2021 01:09   Recent Labs    12/01/21 0346 12/02/21 1049  WBC 6.9 7.8  HGB 10.5* 11.1*  HCT 30.9* 31.5*  PLT 177 213     Recent Labs    12/01/21 0346 12/02/21 1049  NA 130* 131*  K 3.3* 4.2  CL 94* 94*  CO2 28 28  GLUCOSE 112* 104*  BUN 11 10  CREATININE 1.10 0.98  CALCIUM 8.5* 8.9      Intake/Output Summary (Last 24 hours) at 12/02/2021 2051 Last data filed at 12/02/2021 1914 Gross per 24 hour  Intake 394 ml  Output 1250 ml  Net -856 ml         Physical Exam: Vital Signs Blood pressure (!) 102/51, pulse 64, temperature 99.2 F (37.3 C), resp. rate 16, height 5' 9"  (1.753 m), weight 72.6 kg, SpO2 94 %.      General: awake, alert,  NAD HENT: conjugate gaze; oropharynx moist CV: regular rate; no JVD Pulmonary: Wet sounding cough. CTAB. +1 L [place] GI: soft, NT, ND, (+)BS Psychiatric: pleasant Neurological: No apparent spasms Skin: C/D/I. No apparent lesions. Back biopsy area healed.  TGY:BWLS is tender with palpation.       No apparent deformity.      Strength:                RUE: 5/5 SA, 5/5 EF, 5/5 EE, 5/5 WE, 5/5 FF, 5/5 FA                 LUE: 5/5 SA, 5/5 EF, 5/5 EE, 5/5 WE, 5/5 FF, 5/5 FA                 RLE: 2/5 HF, 3/5 KE, 4/5 DF, 1/5 EHL (congenital), 4/5 PF  - limited by pain                 LLE:  5/5 HF, 5/5 KE, 5/5 DF, 5/5 EHL, 5/5 PF    Neurologic exam:   Cognition: Alert and oriented to person, time, and place.  Cranial nerve exam unremarkable  Sensation: Altered/sensitive to light touch over medial right knee following L3 dermatome. Allodynia in RLE   Assessment/Plan: 1. Functional deficits which require 3+ hours per day of interdisciplinary therapy in a comprehensive inpatient rehab setting. Physiatrist is providing close team supervision and 24 hour management of active medical problems listed below. Physiatrist and rehab team continue to assess barriers to discharge/monitor patient progress toward functional and medical  goals  Care Tool:  Bathing    Body parts bathed by patient: Right arm, Left arm, Left upper leg, Chest, Abdomen, Front perineal area, Buttocks, Right upper leg, Face, Right lower leg, Left lower leg     Body parts n/a: Right lower leg, Left lower leg   Bathing assist Assist Level: Independent with assistive device     Upper Body Dressing/Undressing Upper body dressing   What is the patient wearing?: Pull over shirt    Upper body assist Assist Level: Independent Assistive Device Comment: bed level  Lower Body Dressing/Undressing Lower body dressing      What is the patient wearing?: Underwear/pull up, Pants     Lower body assist Assist for lower body dressing: Independent with assitive device     Toileting Toileting    Toileting assist Assist for toileting: Independent with assistive device Assistive Device Comment: urinal   Transfers Chair/bed  transfer  Transfers assist  Chair/bed transfer activity did not occur: Safety/medical concerns (unable to perform due to radiating back pain)  Chair/bed transfer assist level: Contact Guard/Touching assist     Locomotion Ambulation   Ambulation assist   Ambulation activity did not occur: Safety/medical concerns (unable to perform due to radiating back pain)  Assist level: Contact Guard/Touching assist Assistive device: Walker-rolling Max distance: 7ft   Walk 10 feet activity   Assist  Walk 10 feet activity did not occur: Safety/medical concerns (unable to perform due to radiating back pain)  Assist level: Contact Guard/Touching assist Assistive device: Walker-rolling   Walk 50 feet activity   Assist Walk 50 feet with 2 turns activity did not occur: Safety/medical concerns  Assist level: Minimal Assistance - Patient > 75% Assistive device: Walker-rolling    Walk 150 feet activity   Assist Walk 150 feet activity did not occur: Safety/medical concerns  Assist level: Minimal Assistance - Patient >  75% Assistive device: Walker-rolling    Walk 10 feet on uneven surface  activity   Assist Walk 10 feet on uneven surfaces activity did not occur: Safety/medical concerns         Wheelchair     Assist Is the patient using a wheelchair?: No   Wheelchair activity did not occur: Safety/medical concerns (unable to perform due to radiating back pain)         Wheelchair 50 feet with 2 turns activity    Assist    Wheelchair 50 feet with 2 turns activity did not occur: Safety/medical concerns (unable to perform due to radiating back pain)       Wheelchair 150 feet activity     Assist  Wheelchair 150 feet activity did not occur: Safety/medical concerns (unable to perform due to radiating back pain)       Blood pressure (!) 102/51, pulse 64, temperature 99.2 F (37.3 C), resp. rate 16, height $RemoveBe'5\' 9"'THOTUuTOl$  (1.753 m), weight 72.6 kg, SpO2 94 %.  Medical Problem List and Plan: 1. 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  Status post L2-3 disc aspiration per interventional radiology.  Blood cultures no growth to date.  Decadron 4 mg every 12 hours x2 days then 4 mg x 2 days and stop.             -patient may shower             -ELOS/Goals: 10-14 days   moved to 2 hours/day for tolerance.  Con't CIR PT and OT D/c date Monday 10/2 2.  Antithrombotics: -DVT/anticoagulation:  Mechanical: Antiembolism stockings, thigh (TED hose) Bilateral lower extremities             -antiplatelet therapy: none   3. Pain Management: Fentanyl patch/Duragesic patch, Neurontin 300 mg 3 times daily, Dilaudid 4 mg every 4 hours as needed pain, Robaxin as needed              - Zanaflex 4 mg TID instead of robaxin per pt preference              - Increase gabapentin to 600 mg TID for RLE neuropathic pain ; BUN/Cr stable 9/17              - Last dose Decadron 9/17              - Tried baclofen 10 mg; Dced d/t no effect              - Valium 2 mg Q6H  PRN  added, given relative spasm-free intervals following QHS clonazepam - wean as tolerated              - Dilaudid 1 mg IV given 1x for imaging; reinforced to patient no further IV. Note, some nursing concern to seeking behavior.              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 - Concern to complete ESI given possible discitis/osteomyelitis,   -9/19 increase gabapentin to 869m TID,  continue fentanyl 12 patch, increase zanaflex to 485mQID for muscle spasms  9/20 increase fentanyl to 2562m called NSGY to discuss  9/21 Fentanyl patch has fallen off, will replace, Spoke with NSGY, would hold off on injection due to concern of infection  9/22 will change zoloft to cymbalta 13m13mily  9/23 pt having issues with adherence of fentanyl patch   -will dc and try MS contin 13mg20m   -change tizanidine to q6 hours  9/24 pt feeling better with Ms Contin 13mg 34m-continue   -q6 tizanidine helped with sleep and spasms as well  9/25-will increase gabapentin to 1200 mg TID- and cymbalta will increase to 60 mg on Friday-   9/26- pt only taken Dilaudid 3x in last 28+ hours- will encourage him to take more.   9/27- will increase Zanaflex 6 mg QID- and pt taking prn dilaudid more- doing somewhat better  9/28- hasn't received any pain meds overnight- Zanaflex could be making him confused, so lowered back top previous dose.              9/30 - no apparent confusion  4. Mood/Behavior/Sleep: Klonopin 0.25 mg nightly, Zoloft 50 mg daily             -antipsychotic agents: N/A             - DC klonopin (home med) given PRN valium above; pt agreeable  -Continue valium 2mg [id number]  -appreciate neuropsych input 5. Neuropsych/cognition: This patient is capable of making decisions on his own behalf. 6. Skin/Wound Care: Routine skin checks 7.  Fluids/Electrolytes/Nutrition: Routine in and outs with follow-up chemistries             - CMP stable 9/18 8.  ID/discitis/osteomyelitis/fever.  Intravenous daptomycin and ceftriaxone through 12/25/2021 per infectious disease             - Disc cultures NGTD             - MRI lumbar spine 9/17 stable             - Weekly labs to ID, q2week ESR/CRP  -CRP 9/17 0.6  9/27- CRP up to 2.3- will recheck Friday before he leaves to make sure not getting worse.   9/28- will actually check today since confused  -9/29 ESR 26, CRP 22.6 yesterday, ID following- appreciate assistance, repeat MRI with no definitive evidence infection, WBC not elevated, U/A neg, PICC line removed today, ID starting oral linezolid 600mg BI17md cefadroxil 1g BID until 12/25/21              - 9/30 - Discussed RUL opacity and clinical respiratory findings with ID; plan to continue current abx as would cover potential aspiration PNA. Nursing aware to contact MD if any vital instability requiring  transition to IV abx and fluids. Labs stable.    9.  Tremors.  Mysoline as directed 10.  Tobacco abuse.  Provide counseling 11.  GERD.  Protonix 39m daily 12.  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  Follow-up outpatient  9/27- having testicle phantom pain- hopefully will improve with gabapentin 13. Low albumin. mild 3.4, encourage protein intake  -Added ensure, appears PO intake OK for some meals/ decreased other times 14. Constipation  9/28- cannot find a BM recorded, but I know he was going- might have bene in therapy- will verify with nursing   9/29 LBM yesterday-improved 15. Confusion  9/28- thinks it's due to Increase in Zanaflex done yesterday- will decrease back to 4 mg QID- if doesn't improve, will check U/A and Cx and other ID work up- he tolerated pain meds prior, so think its a combination of those with zanaflex increase and possible infection? Since has low grade temp which is new and  confused, will check u/A and Cx now and monitor- due to osteomyelitis, will also recheck all labs today  9/29 continued to have episodes of confusion last night but appears to be improving 16. Hypokalemia/hyponatremia  -9/29 KCL 275m x2, salt tabs ordered, recheck tomorrow -> improved   L[place]: 14 days [place]/30/2023, 8:51 PM

## 2021-12-03 LAB — BASIC METABOLIC PANEL
Anion gap: 9 (ref 5–15)
BUN: 7 mg/dL — ABNORMAL LOW (ref 8–23)
CO2: 28 mmol/L (ref 22–32)
Calcium: 8.7 mg/dL — ABNORMAL LOW (ref 8.9–10.3)
Chloride: 94 mmol/L — ABNORMAL LOW (ref 98–111)
Creatinine, Ser: 0.93 mg/dL (ref 0.61–1.24)
GFR, Estimated: >60 mL/min (ref >60)
Glucose, Bld: 108 mg/dL — ABNORMAL HIGH (ref 70–99)
Potassium: 4 mmol/L (ref 3.5–5.1)
Sodium: 131 mmol/L — ABNORMAL LOW (ref 135–145)

## 2021-12-03 LAB — CBC
HCT: 30 % — ABNORMAL LOW (ref 39.0–52.0)
Hemoglobin: 10.3 g/dL — ABNORMAL LOW (ref 13.0–17.0)
MCH: 32.3 pg (ref 26.0–34.0)
MCHC: 34.3 g/dL (ref 30.0–36.0)
MCV: 94 fL (ref 80.0–100.0)
Platelets: 208 10*3/uL (ref 150–400)
RBC: 3.19 MIL/uL — ABNORMAL LOW (ref 4.22–5.81)
RDW: 12.5 % (ref 11.5–15.5)
WBC: 5.4 10*3/uL (ref 4.0–10.5)
nRBC: 0 % (ref 0.0–0.2)

## 2021-12-03 NOTE — Progress Notes (Signed)
Infectious disease follow-up note  Patient chart reviewed.  No acute events were noted overnight. Tmax 99.4.  O2 saturations documented as greater than 92% on 1 L via nasal cannula.  Blood cultures remain no growth.  No new imaging today.  Lab work today is overall stable.  Continue with current antibiotics.  Attempt to wean nasal cannula as able.  We will continue to follow-up again tomorrow.   Raynelle [facility] 12/03/2021, 8:22 AM

## 2021-12-03 NOTE — Progress Notes (Signed)
Occupational Therapy Session Note  Patient Details  Name: Cameron [name] MRN: [id number] Date of Birth: September 29, 1950  Today's Date: 12/03/2021 OT Individual Time: 1330-1415 OT Individual Time Calculation (min): 45 min    Short Term Goals: Week 2:  OT Short Term Goal 1 (Week 2): STG=LTG 2/2 ELOS   Skilled Therapeutic Interventions/Progress Updates:    Pt received supine with no c/o pain, agreeable to OT session. His wife was present and involved throughout session. Pt agreeable to take shower. Pt completed bed mobility to EOB with (S). CGA for ambulatory transfer into the bathroom and to the toilet using the RW. He voided loose BM and was able to complete hygiene seated. Min cueing throughout session for attention to safety and body awareness. He transferred into the shower with CGA. He completed UB bathing seated with (S). CGA for LB bathing seated on the TTB with min cueing for scooting back to avoid anterior LOB. Cueing also provided to use figure 4 technique for distal washing instead of bending forward. He dried off and returned to EOB. He completed UB dressing with (S) and LB with CGA for standing assist. He was left supine with all needs met, wife present.    Therapy Documentation Precautions:  Precautions Precautions: Fall, Back Precaution Comments: Back precautions for pain control (no formal order) Restrictions Weight Bearing Restrictions: No  Therapy/Group: Individual Therapy  Curtis Sites 12/03/2021, 7:26 AM

## 2021-12-03 NOTE — Progress Notes (Signed)
PROGRESS NOTE   Subjective/Complaints:  Seen at bedside this AM. No further fevers >100 overnight. BP improved. Participated in PT without need for oxygen, sats maintained >92%.  Spoke at bedside, patient remains mildly confused but overall appropriate. Fully oriented but does not remember working with PT this AM. Cough improved. Pain in RLE remains consistent, unchanged. No other concerns or complaints. Endorses he is going home Tuesday.  ROS: No fevers, chills, cough; denies N/V, abdominal pain, constipation, diarrhea, SOB,  chest pain, new weakness or paraesthesias.    Objective:   No results found. Recent Labs    12/02/21 1049 12/03/21 0637  WBC 7.8 5.4  HGB 11.1* 10.3*  HCT 31.5* 30.0*  PLT 213 208     Recent Labs    12/02/21 1049 12/03/21 0637  NA 131* 131*  K 4.2 4.0  CL 94* 94*  CO2 28 28  GLUCOSE 104* 108*  BUN 10 7*  CREATININE 0.98 0.93  CALCIUM 8.9 8.7*      Intake/Output Summary (Last 24 hours) at 12/03/2021 2016 Last data filed at 12/03/2021 1954 Gross per 24 hour  Intake 1800 ml  Output 2375 ml  Net -575 ml         Physical Exam: Vital Signs Blood pressure 128/68, pulse 79, temperature 98.9 F (37.2 C), resp. rate 15, height 5' 9"  (1.753 m), weight 72.6 kg, SpO2 92 %.      General: awake, alert,  NAD HENT: conjugate gaze; oropharynx moist CV: regular rate; no JVD Pulmonary:  CTAB. No rales, rhonchi, or wheezing. On RA.  GI: soft, NT, ND, (+)BS Psychiatric: pleasant, mildly flat.  Neurological: Awake, alert, and oriented x3. Can perform basic math/ad change with increased time but ultimately self-corrects. Memory deficit regarding AM therapies. Allodynia RLE especially along medial knee - consistent with prior exams. No clonus on ankle jerk, negative babinski bilaterally.   PE from prior encounter: Skin: C/D/I. No apparent lesions. Back biopsy area healed.  AWU:JNWM is tender  with palpation.       No apparent deformity.      Strength:                RUE: 5/5 SA, 5/5 EF, 5/5 EE, 5/5 WE, 5/5 FF, 5/5 FA                 LUE: 5/5 SA, 5/5 EF, 5/5 EE, 5/5 WE, 5/5 FF, 5/5 FA                 RLE: 2/5 HF, 3/5 KE, 4/5 DF, 1/5 EHL (congenital), 4/5 PF  - limited by pain                 LLE:  5/5 HF, 5/5 KE, 5/5 DF, 5/5 EHL, 5/5 PF    Neurologic exam:   Cognition: Alert and oriented to person, time, and place.  Cranial nerve exam unremarkable  Sensation: Altered/sensitive to light touch over medial right knee following L3 dermatome. Allodynia in RLE   Assessment/Plan: 1. Functional deficits which require 3+ hours per day of interdisciplinary therapy in a comprehensive inpatient rehab setting. Physiatrist is providing close team supervision and 24 hour management of  active medical problems listed below. Physiatrist and rehab team continue to assess barriers to discharge/monitor patient progress toward functional and medical goals  Care Tool:  Bathing    Body parts bathed by patient: Right arm, Left arm, Left upper leg, Chest, Abdomen, Front perineal area, Buttocks, Right upper leg, Face, Right lower leg, Left lower leg     Body parts n/a: Right lower leg, Left lower leg   Bathing assist Assist Level: Independent with assistive device     Upper Body Dressing/Undressing Upper body dressing   What is the patient wearing?: Pull over shirt    Upper body assist Assist Level: Independent Assistive Device Comment: bed level  Lower Body Dressing/Undressing Lower body dressing      What is the patient wearing?: Underwear/pull up, Pants     Lower body assist Assist for lower body dressing: Independent with assitive device     Toileting Toileting    Toileting assist Assist for toileting: Independent with assistive device Assistive Device Comment: urinal   Transfers Chair/bed transfer  Transfers assist  Chair/bed transfer activity did not occur:  Safety/medical concerns (unable to perform due to radiating back pain)  Chair/bed transfer assist level: Independent with assistive device     Locomotion Ambulation   Ambulation assist   Ambulation activity did not occur: Safety/medical concerns (unable to perform due to radiating back pain)  Assist level: Independent with assistive device Assistive device: Walker-rolling Max distance: 390   Walk 10 feet activity   Assist  Walk 10 feet activity did not occur: Safety/medical concerns (unable to perform due to radiating back pain)  Assist level: Independent with assistive device Assistive device: Walker-rolling   Walk 50 feet activity   Assist Walk 50 feet with 2 turns activity did not occur: Safety/medical concerns  Assist level: Independent with assistive device Assistive device: Walker-rolling    Walk 150 feet activity   Assist Walk 150 feet activity did not occur: Safety/medical concerns  Assist level: Independent with assistive device Assistive device: Walker-rolling    Walk 10 feet on uneven surface  activity   Assist Walk 10 feet on uneven surfaces activity did not occur: Safety/medical concerns   Assist level: Independent with assistive device Assistive device: Walker-rolling   Wheelchair     Assist Is the patient using a wheelchair?: No Type of Wheelchair: Manual Wheelchair activity did not occur: Safety/medical concerns (unable to perform due to radiating back pain)  Wheelchair assist level: Independent      Wheelchair 50 feet with 2 turns activity    Assist    Wheelchair 50 feet with 2 turns activity did not occur: Safety/medical concerns (unable to perform due to radiating back pain)   Assist Level: Independent   Wheelchair 150 feet activity     Assist  Wheelchair 150 feet activity did not occur: Safety/medical concerns (unable to perform due to radiating back pain)   Assist Level: Independent   Blood pressure 128/68,  pulse 79, temperature 98.9 F (37.2 C), resp. rate 15, height $RemoveBe'5\' 9"'hZvrWPBye$  (1.753 m), weight 72.6 kg, SpO2 92 %.  Medical Problem List and Plan: 1. 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  Status post L2-3 disc aspiration per interventional radiology.  Blood cultures no growth to date.  Decadron 4 mg every 12 hours x2 days then 4 mg x 2 days and stop.             -patient may shower             -  ELOS/Goals: 10-14 days   moved to 2 hours/day for tolerance.  Con't CIR PT and OT D/c date Monday 10/2 2.  Antithrombotics: -DVT/anticoagulation:  Mechanical: Antiembolism stockings, thigh (TED hose) Bilateral lower extremities             -antiplatelet therapy: none   3. Pain Management: Fentanyl patch/Duragesic patch, Neurontin 300 mg 3 times daily, Dilaudid 4 mg every 4 hours as needed pain, Robaxin as needed              - Zanaflex 4 mg TID instead of robaxin per pt preference              - Increase gabapentin to 600 mg TID for RLE neuropathic pain ; BUN/Cr stable 9/17              - Last dose Decadron 9/17              - Tried baclofen 10 mg; Dced d/t no effect              - Valium 2 mg Q6H PRN added, given relative spasm-free intervals following QHS clonazepam - wean as tolerated              - Dilaudid 1 mg IV given 1x for imaging; reinforced to patient no further IV. Note, some nursing concern to seeking behavior.              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 - Concern to complete ESI given possible discitis/osteomyelitis,   -9/19 increase gabapentin to 819m TID,  continue fentanyl 12 patch, increase zanaflex to 420mQID for muscle spasms  9/20 increase fentanyl to 25104m called NSGY to discuss  9/21 Fentanyl patch has fallen off, will replace, Spoke with NSGY, would hold off  on injection due to concern of infection  9/22 will change zoloft to cymbalta 70m47mily  9/23 pt having issues with adherence of fentanyl patch   -will dc and try MS contin 70mg19m   -change tizanidine to q6 hours  9/24 pt feeling better with Ms Contin 70mg 81m-continue   -q6 tizanidine helped with sleep and spasms as well  9/25-will increase gabapentin to 1200 mg TID- and cymbalta will increase to 60 mg on Friday-   9/26- pt only taken Dilaudid 3x in last 28+ hours- will encourage him to take more.   9/27- will increase Zanaflex 6 mg QID- and pt taking prn dilaudid more- doing somewhat better  9/28- hasn't received any pain meds overnight- Zanaflex could be making him confused, so lowered back top previous dose.              9/30 - Mild ongoing confusion, likely exacerbated by infection            10/1 - S/s infection much improved; mild confusion still present  4. Mood/Behavior/Sleep: Klonopin 0.25 mg nightly, Zoloft 50 mg daily             -antipsychotic agents: N/A             - DC klonopin (home med) given PRN valium above; pt agreeable  -Continue valium 2mg [id number]  -appreciate neuropsych input  5. Neuropsych/cognition: This patient is capable of making decisions on his own behalf. 6. Skin/Wound  Care: Routine skin checks 7. Fluids/Electrolytes/Nutrition: Routine in and outs with follow-up chemistries             - CMP stable 9/18 8.  ID/discitis/osteomyelitis/fever.  Intravenous daptomycin and ceftriaxone through 12/25/2021 per infectious disease             - Disc cultures NGTD             - MRI lumbar spine 9/17 stable             - Weekly labs to ID, q2week ESR/CRP  -CRP 9/17 0.6  9/27- CRP up to 2.3- will recheck Friday before he leaves to make sure not getting worse.   9/28- will actually check today since confused  -9/29 ESR 26, CRP 22.6 yesterday, ID following- appreciate assistance, repeat MRI with no definitive evidence infection, WBC not elevated, U/A neg, PICC line  removed today, ID starting oral linezolid 616m BID and cefadroxil 1g BID until 12/25/21              - 9/30 - Discussed RUL opacity and clinical respiratory findings with ID; plan to continue current abx as would cover potential aspiration PNA. Nursing aware to contact MD if any vital instability requiring transition to IV abx and fluids. Labs stable.               - 10/1 - respiratory and overall infectious symptoms much improved. Per ID, continue current Tx.   9.  Tremors.  Mysoline as directed 10.  Tobacco abuse.  Provide counseling 11.  GERD.  Protonix 451mdaily 12.  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  Follow-up outpatient  9/27- having testicle phantom pain- hopefully will improve with gabapentin 13. Low albumin. mild 3.4, encourage protein intake  -Added ensure, appears PO intake OK for some meals/ decreased other times 14. Constipation  9/28- cannot find a BM recorded, but I know he was going- might have bene in therapy- will verify with nursing   9/29 LBM yesterday-improved  15. Confusion  9/28- thinks it's due to Increase in Zanaflex done yesterday- will decrease back to 4 mg QID- if doesn't improve, will check U/A and Cx and other ID work up- he tolerated pain meds prior, so think its a combination of those with zanaflex increase and possible infection? Since has low grade temp which is new and confused, will check u/A and Cx now and monitor- due to osteomyelitis, will also recheck all labs today  9/29 continued to have episodes of confusion last night but appears to be improving             10/1 - persistent mild confusion, however appears improving. Likely combined effects of infection and polypharmacy; as pain control has remained a significant challenge, will allow primary team to further address medications.   16. Hypokalemia/hyponatremia  -9/29 KCL [id number], salt tabs ordered, recheck tomorrow -> improved            - 9/30 - 10/1 -  K, Na stable on current regimen   LOS: 15 days [place]/03/2021, 8:16 PM

## 2021-12-03 NOTE — Progress Notes (Addendum)
Physical Therapy Session Note  Patient Details  Name: Cameron [name] MRN: [id number] Date of Birth: 1950/08/25  Today's Date: 12/03/2021 PT Individual Time: 0903-0948 PT Individual Time Calculation (min): 45 min   Short Term Goals: Week 2:  PT Short Term Goal 1 (Week 2): =LTGs d/t ELOS  Skilled Therapeutic Interventions/Progress Updates:      Therapy Documentation Precautions:  Precautions Precautions: Fall, Back Precaution Comments: Back precautions for pain control (no formal order) Restrictions Weight Bearing Restrictions: No  Pt received semi-reclined in bed with NT present and agreeable to PT session. Pt repots 5/10 pain, pre-medicated. Pt with mild confusion and A&O to person and date. On arrival, pt on 1 L of O2 and weaned to RA with O2 saturations >92 at rest and with mobility. Pt requested need to toilet and Mod I with rolling, supine to sit, sit to stand and gait x 10 ft to restroom on level and unlevel surfaces. Pt continent of bladder and incontinent of bowel. Pt (S) with peri-care seated on toilet. Pt ambulated 10 ft to bed and PT assessed pain interference, sensation, strength, and balance. Pt mod I with sit to lying and left semi-reclined in bed with all needs in reach and alarm on.    Therapy/Group: Individual Therapy  Verl Dicker Verl Dicker PT, DPT  12/03/2021, 7:46 AM

## 2021-12-04 ENCOUNTER — Ambulatory Visit: Payer: POS | Admitting: Infectious Diseases

## 2021-12-04 NOTE — Progress Notes (Signed)
PROGRESS NOTE   Subjective/Complaints:  Seen at bedside this AM. No further fevers >100 Pt reports still a little confused- he admits- wife says he was telling her that the room/hospital becomes another building at night- he kept getting up and setting bed alarm off- got telesitter-  Patient reports pain somewhat better- per wife, not yelling with any movement anymore.   Asked nursing to get pt up today and moving, so can make sure not too weak to do so.  ROS:  Pt denies SOB, abd pain, CP, N/V/C/D, and vision changes   Objective:   No results found. Recent Labs    12/02/21 1049 12/03/21 0637  WBC 7.8 5.4  HGB 11.1* 10.3*  HCT 31.5* 30.0*  PLT 213 208    Recent Labs    12/02/21 1049 12/03/21 0637  NA 131* 131*  K 4.2 4.0  CL 94* 94*  CO2 28 28  GLUCOSE 104* 108*  BUN 10 7*  CREATININE 0.98 0.93  CALCIUM 8.9 8.7*     Intake/Output Summary (Last 24 hours) at 12/04/2021 1918 Last data filed at 12/04/2021 1700 Gross per 24 hour  Intake 715 ml  Output 3075 ml  Net -2360 ml        Physical Exam: Vital Signs Blood pressure 131/62, pulse 75, temperature 98.8 F (37.1 C), resp. rate 15, height _0  (1.753 m), weight 72.6 kg, SpO2 97 %.      General: awake, alert,  NAD HENT: conjugate gaze; oropharynx moist CV: regular rate; no JVD Pulmonary:  CTAB. No rales, rhonchi, or wheezing. On RA.  GI: soft, NT, ND, (+)BS Psychiatric: pleasant, mildly flat.  Neurological: Awake, alert, and oriented x3. Can perform basic math/ad change with increased time but ultimately self-corrects. Memory deficit regarding AM therapies. Allodynia RLE especially along medial knee - consistent with prior exams. No clonus on ankle jerk, negative babinski bilaterally.   PE from prior encounter: Skin: C/D/I. No apparent lesions. Back biopsy area healed.  SWF:UXNA is tender with palpation.       No apparent deformity.       Strength:                RUE: 5/5 SA, 5/5 EF, 5/5 EE, 5/5 WE, 5/5 FF, 5/5 FA                 LUE: 5/5 SA, 5/5 EF, 5/5 EE, 5/5 WE, 5/5 FF, 5/5 FA                 RLE: 2/5 HF, 3/5 KE, 4/5 DF, 1/5 EHL (congenital), 4/5 PF  - limited by pain                 LLE:  5/5 HF, 5/5 KE, 5/5 DF, 5/5 EHL, 5/5 PF    Neurologic exam:   Cognition: Alert and oriented to person, time, and place.  Cranial nerve exam unremarkable  Sensation: Altered/sensitive to light touch over medial right knee following L3 dermatome. Allodynia in RLE   Assessment/Plan: 1. Functional deficits which require 3+ hours per day of interdisciplinary therapy in a comprehensive inpatient rehab setting. Physiatrist is providing close team supervision and 24  hour management of active medical problems listed below. Physiatrist and rehab team continue to assess barriers to discharge/monitor patient progress toward functional and medical goals  Care Tool:  Bathing    Body parts bathed by patient: Right arm, Left arm, Left upper leg, Chest, Abdomen, Front perineal area, Buttocks, Right upper leg, Face, Right lower leg, Left lower leg     Body parts n/a: Right lower leg, Left lower leg   Bathing assist Assist Level: Independent with assistive device     Upper Body Dressing/Undressing Upper body dressing   What is the patient wearing?: Pull over shirt    Upper body assist Assist Level: Independent Assistive Device Comment: bed level  Lower Body Dressing/Undressing Lower body dressing      What is the patient wearing?: Underwear/pull up, Pants     Lower body assist Assist for lower body dressing: Independent with assitive device     Toileting Toileting    Toileting assist Assist for toileting: Independent with assistive device Assistive Device Comment: urinal   Transfers Chair/bed transfer  Transfers assist  Chair/bed transfer activity did not occur: Safety/medical concerns (unable to perform due to radiating back  pain)  Chair/bed transfer assist level: Independent with assistive device Chair/bed transfer assistive device: Programmer, multimedia   Ambulation assist   Ambulation activity did not occur: Safety/medical concerns (unable to perform due to radiating back pain)  Assist level: Independent with assistive device Assistive device: Walker-rolling Max distance: 390   Walk 10 feet activity   Assist  Walk 10 feet activity did not occur: Safety/medical concerns (unable to perform due to radiating back pain)  Assist level: Independent with assistive device Assistive device: Walker-rolling   Walk 50 feet activity   Assist Walk 50 feet with 2 turns activity did not occur: Safety/medical concerns  Assist level: Independent with assistive device Assistive device: Walker-rolling    Walk 150 feet activity   Assist Walk 150 feet activity did not occur: Safety/medical concerns  Assist level: Independent with assistive device Assistive device: Walker-rolling    Walk 10 feet on uneven surface  activity   Assist Walk 10 feet on uneven surfaces activity did not occur: Safety/medical concerns   Assist level: Independent with assistive device Assistive device: Walker-rolling   Wheelchair     Assist Is the patient using a wheelchair?: No Type of Wheelchair: Manual Wheelchair activity did not occur: Safety/medical concerns (unable to perform due to radiating back pain)  Wheelchair assist level: Independent      Wheelchair 50 feet with 2 turns activity    Assist    Wheelchair 50 feet with 2 turns activity did not occur: Safety/medical concerns (unable to perform due to radiating back pain)   Assist Level: Independent   Wheelchair 150 feet activity     Assist  Wheelchair 150 feet activity did not occur: Safety/medical concerns (unable to perform due to radiating back pain)   Assist Level: Independent   Blood pressure 131/62, pulse 75, temperature  98.8 F (37.1 C), resp. rate 15, height _0  (1.753 m), weight 72.6 kg, SpO2 97 %.  Medical Problem List and Plan: 1. 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  Status post L2-3 disc aspiration per interventional radiology.  Blood cultures no growth to date.  Decadron 4 mg every 12 hours x2 days then 4 mg x 2 days and stop.             -patient may shower             -  ELOS/Goals: 10-14 days   moved to 2 hours/day for tolerance.  Con't CIR PT and OT D/c date Monday 10/2  10/2- delayed d/c due to confusion- it' simproving, but pt still needing telesitter- therapy done 2.  Antithrombotics: -DVT/anticoagulation:  Mechanical: Antiembolism stockings, thigh (TED hose) Bilateral lower extremities             -antiplatelet therapy: none   3. Pain Management: Fentanyl patch/Duragesic patch, Neurontin 300 mg 3 times daily, Dilaudid 4 mg every 4 hours as needed pain, Robaxin as needed              - Zanaflex 4 mg TID instead of robaxin per pt preference              - Increase gabapentin to 600 mg TID for RLE neuropathic pain ; BUN/Cr stable 9/17              - Last dose Decadron 9/17              - Tried baclofen 10 mg; Dced d/t no effect              - Valium 2 mg Q6H PRN added, given relative spasm-free intervals following QHS clonazepam - wean as tolerated              - Dilaudid 1 mg IV given 1x for imaging; reinforced to patient no further IV. Note, some nursing concern to seeking behavior.              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 - Concern to complete ESI given possible discitis/osteomyelitis,   -9/19 increase gabapentin to 811m TID,  continue fentanyl 12 patch, increase zanaflex to 484mQID for muscle spasms  9/20 increase fentanyl to 2552m called NSGY to discuss  9/21  Fentanyl patch has fallen off, will replace, Spoke with NSGY, would hold off on injection due to concern of infection  9/22 will change zoloft to cymbalta 75m4mily  9/23 pt having issues with adherence of fentanyl patch   -will dc and try MS contin 75mg75m   -change tizanidine to q6 hours  9/24 pt feeling better with Ms Contin 75mg 35m-continue   -q6 tizanidine helped with sleep and spasms as well  9/25-will increase gabapentin to 1200 mg TID- and cymbalta will increase to 60 mg on Friday-   9/26- pt only taken Dilaudid 3x in last 28+ hours- will encourage him to take more.   9/27- will increase Zanaflex 6 mg QID- and pt taking prn dilaudid more- doing somewhat better  9/28- hasn't received any pain meds overnight- Zanaflex could be making him confused, so lowered back top previous dose.              9/30 - Mild ongoing confusion, likely exacerbated by infection            10/1 - S/s infection much improved; mild confusion still present  10/2- pain doing much better per pt- will con't regimen, since I don't think it's causing confusion, but see if can have him take prns less.  4. Mood/Behavior/Sleep: Klonopin 0.25 mg nightly, Zoloft 50 mg daily             -antipsychotic agents: N/A             -  DC klonopin (home med) given PRN valium above; pt agreeable  -Continue valium 93m Q6H PRN  -appreciate neuropsych input  5. Neuropsych/cognition: This patient is capable of making decisions on his own behalf. 6. Skin/Wound Care: Routine skin checks 7. Fluids/Electrolytes/Nutrition: Routine in and outs with follow-up chemistries             - CMP stable 9/18 8.  ID/discitis/osteomyelitis/fever.  Intravenous daptomycin and ceftriaxone through 12/25/2021 per infectious disease             - Disc cultures NGTD             - MRI lumbar spine 9/17 stable             - Weekly labs to ID, q2week ESR/CRP  -CRP 9/17 0.6  9/27- CRP up to 2.3- will recheck Friday before he leaves to make sure not  getting worse.   9/28- will actually check today since confused  -9/29 ESR 26, CRP 22.6 yesterday, ID following- appreciate assistance, repeat MRI with no definitive evidence infection, WBC not elevated, U/A neg, PICC line removed today, ID starting oral linezolid 6080mBID and cefadroxil 1g BID until 12/25/21              - 9/30 - Discussed RUL opacity and clinical respiratory findings with ID; plan to continue current abx as would cover potential aspiration PNA. Nursing aware to contact MD if any vital instability requiring transition to IV abx and fluids. Labs stable.               - 10/1 - respiratory and overall infectious symptoms much improved. Per ID, continue current Tx.   10/2- per ID- on Linezolid til 10/6- off IV ABX per ID- will f/u with ID 10/18-  9.  Tremors.  Mysoline as directed 10.  Tobacco abuse.  Provide counseling 11.  GERD.  Protonix 4057maily 12.  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  Follow-up outpatient  9/27- having testicle phantom pain- hopefully will improve with gabapentin 13. Low albumin. mild 3.4, encourage protein intake  -Added ensure, appears PO intake OK for some meals/ decreased other times 14. Constipation  9/28- cannot find a BM recorded, but I know he was going- might have bene in therapy- will verify with nursing   9/29 LBM yesterday-improved  15. Confusion  9/28- thinks it's due to Increase in Zanaflex done yesterday- will decrease back to 4 mg QID- if doesn't improve, will check U/A and Cx and other ID work up- he tolerated pain meds prior, so think its a combination of those with zanaflex increase and possible infection? Since has low grade temp which is new and confused, will check u/A and Cx now and monitor- due to osteomyelitis, will also recheck all labs today  9/29 continued to have episodes of confusion last night but appears to be improving             10/1 - persistent mild confusion, however appears  improving. Likely combined effects of infection and polypharmacy; as pain control has remained a significant challenge, will allow primary team to further address medications.   10/2- kept pt for at least 1 more day to deal with confusion- it's  improving.  16. Hypokalemia/hyponatremia  -9/29 KCL 27m59m2, salt tabs ordered, recheck tomorrow -> improved            - 9/30 - 10/1 - K, Na stable on current regimen  10/2- Na 131-  stable  I spent a total of 50   minutes on total care today- >50% coordination of care- due to d/w SW about delaying d/c after prolonged d/w pt and wife; also d/w nursing about getting pt moving- verified with therapy cannot restart at this time- and called pharmacy to make sure cefadroxil wasn't cause of confusion- was changed to linezolid anyway by ID   LOS: 16 days A FACE TO FACE EVALUATION WAS PERFORMED  Cameron [name] 12/04/2021, 7:18 PM

## 2021-12-04 NOTE — Progress Notes (Signed)
Physical Therapy Discharge Summary  Patient Details  Name: Cameron [name] MRN: [id number] Date of Birth: 10/15/1950  Date of Discharge from [place] 1, 2023     Patient has met 8 of 8 long term goals due to improved activity tolerance, improved postural control, increased range of motion, and decreased pain.  Patient to discharge at an ambulatory level Modified Independent.   Patient's care partner is independent to provide the necessary physical assistance at discharge. Pt to d/[name] home with his wife who observed therapy, pt is also competent able to direct his care.   Reasons goals not met: NA  Recommendation:  Patient will benefit from ongoing skilled PT services in outpatient setting to continue to advance safe functional mobility, address ongoing impairments in pain management, mobility, and minimize fall risk.  Equipment: RW  Reasons for discharge: treatment goals met and discharge from hospital  Patient/family agrees with progress made and goals achieved: Yes  PT Discharge Precautions/Restrictions Precautions Precautions: Fall;Back Precaution Booklet Issued: Yes (comment) Precaution Comments: Back precautions for pain control (no formal order) Restrictions Weight Bearing Restrictions: No Vital Signs Therapy Vitals Temp: 98.8 F (37.1 [name]) Pulse Rate: 75 Resp: 15 BP: 131/62 Patient Position (if appropriate): Lying Oxygen Therapy SpO2: 97 % O2 Device: Room Air Pain   Pain Interference Pain Interference Pain Effect on Sleep: 4. Almost constantly Pain Interference with Therapy Activities: 4. Almost constantly Pain Interference with Day-to-Day Activities: 4. Almost constantly Vision/Perception  Vision - History Ability to See in Adequate Light: 0 Adequate Perception Perception: Within Functional Limits Praxis Praxis: Intact  Cognition Overall Cognitive Status: Within Functional Limits for tasks assessed Arousal/Alertness: Awake/alert Orientation  Level: Oriented to person;Oriented to place;Disoriented to time;Disoriented to situation Memory: Appears intact Awareness: Appears intact Problem Solving: Appears intact Safety/Judgment: Appears intact Sensation Sensation Light Touch: Impaired Detail Peripheral sensation comments: numbness right LE L3 dermatome Hot/Cold: Appears Intact Proprioception: Appears Intact Coordination Gross Motor Movements are Fluid and Coordinated: No Coordination and Movement Description: grossly impaired due to pain Motor  Motor Motor: Other (comment) Motor - Skilled Clinical Observations: significantly limited due to pain  Mobility Bed Mobility Bed Mobility: Rolling Left;Supine to Sit;Rolling Right Rolling Right: Independent with assistive device Rolling Left: Independent with assistive device Supine to Sit: Independent with assistive device Transfers Transfers: Sit to Stand;Stand to Sit Sit to Stand: Independent with assistive device Stand to Sit: Independent with assistive device Transfer (Assistive device): Rolling walker Locomotion  Gait Ambulation: Yes Gait Assistance: Independent with assistive device Gait Distance (Feet): 390 Feet Assistive device: Rolling walker Gait Gait: Yes Gait Pattern: Impaired Gait Pattern: Antalgic Stairs / Additional Locomotion Stairs: Yes Stairs Assistance: Independent with assistive device Stair Management Technique: Two rails Ramp: Supervision/Verbal cueing Curb: Supervision/Verbal cueing  Trunk/Postural Assessment  Cervical Assessment Cervical Assessment: Within Functional Limits Thoracic Assessment Thoracic Assessment: Within Functional Limits Lumbar Assessment Lumbar Assessment: Exceptions to [facility] Postural Control Postural Control: Deficits on evaluation Trunk Control: posteriror weight shift present sitting edge of bed to reduce pain  Balance Balance Balance Assessed: Yes Static Sitting Balance Static Sitting - Balance Support: Feet  supported Static Sitting - Level of Assistance: 6: Modified independent (Device/Increase time) Dynamic Sitting Balance Dynamic Sitting - Balance Support: During functional activity Dynamic Sitting - Level of Assistance: 6: Modified independent (Device/Increase time) Extremity Assessment      RLE Assessment RLE Assessment: Within Functional Limits General Strength Comments: grossly 5/5 LLE Assessment LLE Assessment: Within Functional Limits General Strength Comments: grossly 5/5   Cameron [name]  Cameron [name] 12/04/2021, 4:19 PM

## 2021-12-04 NOTE — Progress Notes (Signed)
[facility] for Infectious Disease  Date of Admission:  11/18/2021           Reason for visit: Follow up on discitis/OM  Current antibiotics: Linezolid Cefadroxil    ASSESSMENT:    71 y.o. male admitted with:  #Possible discitis/osteomyelitis at L2-3 versus degenerative disease and radiculopathy Patient initially presented with acute onset back pain after doing some work involving heavy lifting and splitting wood.  Initial MRI 11/11/21 concerning for early discitis/OM.  Blood cultures 11/11/21 were negative and disc space aspiration cultures 11/13/21 were also negative.  Dr West Bali had recommended 6-8 weeks of daptomycin and ceftriaxone via PICC line for empiric treatment.  Repeat MRI on 11/19/21 with decreased fluid within L2-3 disc space with no progressive findings.  This was interpreted as indeterminate for discitis/OM vs degenerative change.  His initial inflammatory markers were normal and remained normal until last week when noted to have ESR of 20 and CRP 2.3.  Subsequently increased with ESR 26 and CRP 22.6.  He was also newly febrile. He had a PICC line placed 11/16/21 and otherwise had no indwelling lines or tubes. A repeat MRI was obtained given the new onset fever which was most notable for no definite evidence of infection with further decrease in fluid signal in the L2-L3 disc space and only minimal endplate enhancement.  There was no evidence of epidural abscess.  Ultimately, his PICC line was removed as there was concern that this might have been a nidus for his fevers.  This was done on 12/01/2021.  He had a fever about 30 minutes later but has been otherwise afebrile since that time and maintained on oral antibiotics.  #Possible pneumonia Noted on chest x-ray last week.  He has been weaned down to room air and seems to be improving.   RECOMMENDATIONS:    Discussed at length with patient and his wife today Discussed option of continuing oral antibiotics as is to  complete 6 weeks of antibiotic coverage as previously outlined Also discussed alternative option given suspicion that there may have been no spinal infection initially.  This option would consist of stopping treatment for possible discitis and obtaining a short interval MRI in approximately 4 weeks to assess for any interval changes that would suggest relapsed infection requiring reinitiation of antibiotics Patient and his wife would prefer the latter.  Discussed return precautions in case of relapsed infection including worsening back pain, new onset fevers, or new onset weakness Will stop cefadroxil Will continue linezolid as is for 1 week from date of PICC line removal ending on 12/08/2021 due to possibility of line related infection causing his fevers Linezolid will also provide adequate coverage for pneumonia if this is also present Patient had a follow-up appointment with Dr. Jerilynn Mages scheduled for today but was canceled due to his hospitalization.  This has been rescheduled to 12/20/21 at 3:30 with myself   Principal Problem:   Discitis Active Problems:   Hyponatremia   Hypokalemia   Constipation   Delirium    MEDICATIONS:    Scheduled Meds:  cefadroxil  1,000 mg Oral BID   diclofenac  1 patch Transdermal BID   DULoxetine  60 mg Oral Daily   feeding supplement  237 mL Oral BID BM   gabapentin  1,200 mg Oral TID   linezolid  600 mg Oral Q12H   morphine  30 mg Oral Q12H   pantoprazole  40 mg Oral Daily   polyethylene glycol  17 g  Oral BID   primidone  100 mg Oral Daily   And   primidone  150 mg Oral BID   senna-docusate  1 tablet Oral BID   sodium chloride  1 g Oral BID WC   tiZANidine  4 mg Oral Q6H   Continuous Infusions:  sodium chloride Stopped (11/30/21 0850)   PRN Meds:.sodium chloride, acetaminophen, diazepam, HYDROmorphone, ondansetron **OR** ondansetron (ZOFRAN) IV, mouth rinse, sodium chloride flush  SUBJECTIVE:   24 hour events:  No acute events Tmax 98.9 PICC  line out Documented on room air today No new imaging today No new labs today Blood cx negative  Patient's wife reports still has some confusion at night.  He is coughing a little bit.  He otherwise has no new complaints.  Back pain present but slowly improved.  Review of Systems  All other systems reviewed and are negative.     OBJECTIVE:   Blood pressure (!) 101/49, pulse 73, temperature 98.2 F (36.8 C), resp. rate 15, height $RemoveBe'5\' 9"'CNsfzMBqG$  (1.753 m), weight 72.6 kg, SpO2 96 %. Body mass index is 23.63 kg/m.  Physical Exam Constitutional:      Appearance: Normal appearance.  HENT:     Head: Normocephalic and atraumatic.  Eyes:     Extraocular Movements: Extraocular movements intact.     Conjunctiva/sclera: Conjunctivae normal.  Pulmonary:     Effort: Pulmonary effort is normal. No respiratory distress.  Abdominal:     General: There is no distension.     Palpations: Abdomen is soft.  Musculoskeletal:     Cervical back: Normal range of motion and neck supple.     Right lower leg: No edema.     Left lower leg: No edema.  Skin:    General: Skin is warm and dry.  Neurological:     General: No focal deficit present.     Mental Status: He is alert and oriented to person, place, and time.  Psychiatric:        Mood and Affect: Mood normal.        Behavior: Behavior normal.      Lab Results: Lab Results  Component Value Date   WBC 5.4 12/03/2021   HGB 10.3 (L) 12/03/2021   HCT 30.0 (L) 12/03/2021   MCV 94.0 12/03/2021   PLT 208 12/03/2021    Lab Results  Component Value Date   NA 131 (L) 12/03/2021   K 4.0 12/03/2021   CO2 28 12/03/2021   GLUCOSE 108 (H) 12/03/2021   BUN 7 (L) 12/03/2021   CREATININE 0.93 12/03/2021   CALCIUM 8.7 (L) 12/03/2021   GFRNONAA >60 12/03/2021   GFRAA >60 03/22/2017    Lab Results  Component Value Date   ALT 47 (H) 12/01/2021   AST 47 (H) 12/01/2021   ALKPHOS 70 12/01/2021   BILITOT 0.3 12/01/2021       Component Value  Date/Time   CRP 22.6 (H) 11/30/2021 1124       Component Value Date/Time   ESRSEDRATE 26 (H) 11/30/2021 1124     I have reviewed the micro and lab results in Epic.  Imaging: No results found.   Imaging independently reviewed in Epic.    Raynelle High[facility] for [facility] [phone] pager 12/04/2021, 8:47 AM  I have personally spent 50 minutes involved in face-to-face and non-face-to-face activities for this patient on the day of the visit. Professional time spent includes the following activities: Preparing to see the patient (review  of tests), Obtaining and/or reviewing separately obtained history (admission/discharge record), Performing a medically appropriate examination and/or evaluation , Ordering medications/tests/procedures, referring and communicating with other health care professionals, Documenting clinical information in the EMR, Independently interpreting results (not separately reported), Communicating results to the patient/family/caregiver, Counseling and educating the patient/family/caregiver and Care coordination (not separately reported).

## 2021-12-05 ENCOUNTER — Other Ambulatory Visit (HOSPITAL_COMMUNITY): Payer: Self-pay

## 2021-12-05 ENCOUNTER — Telehealth (HOSPITAL_COMMUNITY): Payer: Self-pay

## 2021-12-05 LAB — CULTURE, BLOOD (ROUTINE X 2)
Culture: NO GROWTH
Culture: NO GROWTH
Special Requests: ADEQUATE
Special Requests: ADEQUATE

## 2021-12-05 LAB — CULTURE, FUNGUS WITHOUT SMEAR

## 2021-12-05 MED ORDER — MORPHINE SULFATE ER 15 MG PO TBCR
15.0000 mg | EXTENDED_RELEASE_TABLET | Freq: Two times a day (BID) | ORAL | 0 refills | Status: DC
  Filled 2021-12-05: qty 30, 15d supply, fill #0

## 2021-12-05 MED ORDER — GABAPENTIN 600 MG PO TABS
1200.0000 mg | ORAL_TABLET | Freq: Three times a day (TID) | ORAL | 0 refills | Status: DC
  Filled 2021-12-05: qty 180, 30d supply, fill #0

## 2021-12-05 MED ORDER — DIAZEPAM 2 MG PO TABS
2.0000 mg | ORAL_TABLET | Freq: Four times a day (QID) | ORAL | 0 refills | Status: DC | PRN
  Filled 2021-12-05: qty 20, 5d supply, fill #0

## 2021-12-05 MED ORDER — MORPHINE SULFATE ER 15 MG PO TBCR
15.0000 mg | EXTENDED_RELEASE_TABLET | Freq: Two times a day (BID) | ORAL | Status: DC
  Administered 2021-12-05 – 2021-12-06 (×2): 15 mg via ORAL
  Filled 2021-12-05 (×2): qty 1

## 2021-12-05 MED ORDER — DICLOFENAC EPOLAMINE 1.3 % EX PTCH
1.0000 | MEDICATED_PATCH | Freq: Two times a day (BID) | CUTANEOUS | 0 refills | Status: DC
  Filled 2021-12-05: qty 60, 30d supply, fill #0

## 2021-12-05 MED ORDER — LINEZOLID 600 MG PO TABS
600.0000 mg | ORAL_TABLET | Freq: Two times a day (BID) | ORAL | 0 refills | Status: AC
  Filled 2021-12-05: qty 6, 3d supply, fill #0

## 2021-12-05 MED ORDER — DULOXETINE HCL 60 MG PO CPEP
60.0000 mg | ORAL_CAPSULE | Freq: Every day | ORAL | 0 refills | Status: DC
  Filled 2021-12-05: qty 30, 30d supply, fill #0

## 2021-12-05 MED ORDER — HYDROMORPHONE HCL 4 MG PO TABS
4.0000 mg | ORAL_TABLET | ORAL | 0 refills | Status: DC | PRN
  Filled 2021-12-05: qty 28, 5d supply, fill #0

## 2021-12-05 MED ORDER — MORPHINE SULFATE ER 30 MG PO TBCR
30.0000 mg | EXTENDED_RELEASE_TABLET | Freq: Two times a day (BID) | ORAL | 0 refills | Status: DC
  Filled 2021-12-05: qty 14, 7d supply, fill #0

## 2021-12-05 MED ORDER — TIZANIDINE HCL 4 MG PO TABS
4.0000 mg | ORAL_TABLET | Freq: Four times a day (QID) | ORAL | 0 refills | Status: DC
  Filled 2021-12-05: qty 120, 30d supply, fill #0

## 2021-12-05 MED ORDER — POLYETHYLENE GLYCOL 3350 17 G PO PACK: 17.0000 g | PACK | Freq: Two times a day (BID) | ORAL | 0 refills | Status: DC

## 2021-12-05 MED ORDER — ROSUVASTATIN CALCIUM 20 MG PO TABS
20.0000 mg | ORAL_TABLET | Freq: Every day | ORAL | 0 refills | Status: AC
  Filled 2021-12-05: qty 30, 30d supply, fill #0

## 2021-12-05 MED ORDER — SERTRALINE HCL 50 MG PO TABS
50.0000 mg | ORAL_TABLET | Freq: Every day | ORAL | 0 refills | Status: DC
  Filled 2021-12-05: qty 30, 30d supply, fill #0

## 2021-12-05 NOTE — Telephone Encounter (Signed)
Received notification from [employer] regarding a prior authorization for Linezolid '600mg'$ . Authorization has been APPROVED from 11/05/2021 to 01/04/2022.    Authorization # PA Case ID: [id number] Key # [id number]

## 2021-12-05 NOTE — Progress Notes (Signed)
PROGRESS NOTE   Subjective/Complaints:  Pt reports feeling more himself.  Per nursing, wasn't wobbly when walking to bathroom with nursing.  No confusion overnight/agitation per nursing- per wife, called her at 11pm, and didn't know the time, but resolved pretty fast once she gave him cues.   Only taking dilaudid 3x in last 4 days- On MS Contin 30 mg BID- will reduce to 15 mg BID based on d/w wife.   Will take effect this evening.     ROS:   Pt denies SOB, abd pain, CP, N/V/C/D, and vision changes except for HPI    Objective:   No results found. Recent Labs    12/02/21 1049 12/03/21 0637  WBC 7.8 5.4  HGB 11.1* 10.3*  HCT 31.5* 30.0*  PLT 213 208    Recent Labs    12/02/21 1049 12/03/21 0637  NA 131* 131*  K 4.2 4.0  CL 94* 94*  CO2 28 28  GLUCOSE 104* 108*  BUN 10 7*  CREATININE 0.98 0.93  CALCIUM 8.9 8.7*     Intake/Output Summary (Last 24 hours) at 12/05/2021 0959 Last data filed at 12/04/2021 2242 Gross per 24 hour  Intake --  Output 2050 ml  Net -2050 ml        Physical Exam: Vital Signs Blood pressure 128/69, pulse 72, temperature 98 F (36.7 C), temperature source Oral, resp. rate 20, height 5' 9"  (1.753 m), weight 72.6 kg, SpO2 95 %.       General: awake, alert, appropriate, slightly delayed responses, but more his drawl than anything; supine in bed, but moving easier in bed; NAD HENT: conjugate gaze; oropharynx moist CV: regular rate; no JVD Pulmonary: CTA B/L; no W/R/R- decreased at bases GI: soft, NT, ND, (+)BS Psychiatric: appropriate Neurological: Ox3- knew was October 2023- thought was the 4th- knew at [facility] in [place] and why in hospital Allodynia RLE especially along medial knee - consistent with prior exams. No clonus on ankle jerk, negative babinski bilaterally.  No more muscle jerking going on- that jerks his whole body  PE from prior  encounter: Skin: C/D/I. No apparent lesions. Back biopsy area healed.  VEX:OGAC is tender with palpation.       No apparent deformity.      Strength:                RUE: 5/5 SA, 5/5 EF, 5/5 EE, 5/5 WE, 5/5 FF, 5/5 FA                 LUE: 5/5 SA, 5/5 EF, 5/5 EE, 5/5 WE, 5/5 FF, 5/5 FA                 RLE: 2/5 HF, 3/5 KE, 4/5 DF, 1/5 EHL (congenital), 4/5 PF  - limited by pain                 LLE:  5/5 HF, 5/5 KE, 5/5 DF, 5/5 EHL, 5/5 PF    Neurologic exam:   Cognition: Alert and oriented to person, time, and place.  Cranial nerve exam unremarkable  Sensation: Altered/sensitive to light touch over medial right knee following L3 dermatome. Allodynia in  RLE   Assessment/Plan: 1. Functional deficits which require 3+ hours per day of interdisciplinary therapy in a comprehensive inpatient rehab setting. Physiatrist is providing close team supervision and 24 hour management of active medical problems listed below. Physiatrist and rehab team continue to assess barriers to discharge/monitor patient progress toward functional and medical goals  Care Tool:  Bathing    Body parts bathed by patient: Right arm, Left arm, Left upper leg, Chest, Abdomen, Front perineal area, Buttocks, Right upper leg, Face, Right lower leg, Left lower leg     Body parts n/a: Right lower leg, Left lower leg   Bathing assist Assist Level: Independent with assistive device     Upper Body Dressing/Undressing Upper body dressing   What is the patient wearing?: Pull over shirt    Upper body assist Assist Level: Independent Assistive Device Comment: bed level  Lower Body Dressing/Undressing Lower body dressing      What is the patient wearing?: Underwear/pull up, Pants     Lower body assist Assist for lower body dressing: Independent with assitive device     Toileting Toileting    Toileting assist Assist for toileting: Independent with assistive device Assistive Device Comment: urinal    Transfers Chair/bed transfer  Transfers assist  Chair/bed transfer activity did not occur: Safety/medical concerns (unable to perform due to radiating back pain)  Chair/bed transfer assist level: Independent with assistive device Chair/bed transfer assistive device: Programmer, multimedia   Ambulation assist   Ambulation activity did not occur: Safety/medical concerns (unable to perform due to radiating back pain)  Assist level: Independent with assistive device Assistive device: Walker-rolling Max distance: 390   Walk 10 feet activity   Assist  Walk 10 feet activity did not occur: Safety/medical concerns (unable to perform due to radiating back pain)  Assist level: Independent with assistive device Assistive device: Walker-rolling   Walk 50 feet activity   Assist Walk 50 feet with 2 turns activity did not occur: Safety/medical concerns  Assist level: Independent with assistive device Assistive device: Walker-rolling    Walk 150 feet activity   Assist Walk 150 feet activity did not occur: Safety/medical concerns  Assist level: Independent with assistive device Assistive device: Walker-rolling    Walk 10 feet on uneven surface  activity   Assist Walk 10 feet on uneven surfaces activity did not occur: Safety/medical concerns   Assist level: Independent with assistive device Assistive device: Walker-rolling   Wheelchair     Assist Is the patient using a wheelchair?: No Type of Wheelchair: Manual Wheelchair activity did not occur: Safety/medical concerns (unable to perform due to radiating back pain)  Wheelchair assist level: Independent      Wheelchair 50 feet with 2 turns activity    Assist    Wheelchair 50 feet with 2 turns activity did not occur: Safety/medical concerns (unable to perform due to radiating back pain)   Assist Level: Independent   Wheelchair 150 feet activity     Assist  Wheelchair 150 feet activity did  not occur: Safety/medical concerns (unable to perform due to radiating back pain)   Assist Level: Independent   Blood pressure 128/69, pulse 72, temperature 98 F (36.7 C), temperature source Oral, resp. rate 20, height 5' 9"  (1.753 m), weight 72.6 kg, SpO2 95 %.  Medical Problem List and Plan: 1. 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  Status post L2-3 disc aspiration per interventional radiology.  Blood cultures no growth to date.  Decadron 4 mg  every 12 hours x2 days then 4 mg x 2 days and stop.             -patient may shower             -ELOS/Goals: 10-14 days   moved to 2 hours/day for tolerance.  D/c 10/4- tomorrow- team conference today to finalize d/c.  2.  Antithrombotics: -DVT/anticoagulation:  Mechanical: Antiembolism stockings, thigh (TED hose) Bilateral lower extremities             -antiplatelet therapy: none   3. Pain Management: Fentanyl patch/Duragesic patch, Neurontin 300 mg 3 times daily, Dilaudid 4 mg every 4 hours as needed pain, Robaxin as needed              - Zanaflex 4 mg TID instead of robaxin per pt preference              - Increase gabapentin to 600 mg TID for RLE neuropathic pain ; BUN/Cr stable 9/17              - Last dose Decadron 9/17              - Tried baclofen 10 mg; Dced d/t no effect              - Valium 2 mg Q6H PRN added, given relative spasm-free intervals following QHS clonazepam - wean as tolerated              - Dilaudid 1 mg IV given 1x for imaging; reinforced to patient no further IV. Note, some nursing concern to seeking behavior.              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 - Concern to complete ESI given possible discitis/osteomyelitis,   -9/19 increase gabapentin to 876m TID,  continue fentanyl 12 patch, increase  zanaflex to 461mQID for muscle spasms  9/20 increase fentanyl to 2573m called NSGY to discuss  9/21 Fentanyl patch has fallen off, will replace, Spoke with NSGY, would hold off on injection due to concern of infection  9/22 will change zoloft to cymbalta 31m9mily  9/23 pt having issues with adherence of fentanyl patch   -will dc and try MS contin 31mg68m   -change tizanidine to q6 hours  9/24 pt feeling better with Ms Contin 31mg 32m-continue   -q6 tizanidine helped with sleep and spasms as well  9/25-will increase gabapentin to 1200 mg TID- and cymbalta will increase to 60 mg on Friday-   9/26- pt only taken Dilaudid 3x in last 28+ hours- will encourage him to take more.   9/27- will increase Zanaflex 6 mg QID- and pt taking prn dilaudid more- doing somewhat better  9/28- hasn't received any pain meds overnight- Zanaflex could be making him confused, so lowered back top previous dose.              9/30 - Mild ongoing confusion, likely exacerbated by infection            10/1 - S/s infection much improved; mild confusion still present  10/2- pain doing much better per pt- will con't regimen, since I don't think it's causing confusion, but see if can have him take prns less.   10/3- only taken dilaudid 3x  in last 4 days- will reduce MS Contin to 15 mg BID today  4. Mood/Behavior/Sleep: Klonopin 0.25 mg nightly, Zoloft 50 mg daily             -antipsychotic agents: N/A             - DC klonopin (home med) given PRN valium above; pt agreeable  -Continue valium 9m Q6H PRN  -appreciate neuropsych input  5. Neuropsych/cognition: This patient is capable of making decisions on his own behalf. 6. Skin/Wound Care: Routine skin checks 7. Fluids/Electrolytes/Nutrition: Routine in and outs with follow-up chemistries             - CMP stable 9/18 8.  ID/discitis/osteomyelitis/fever.  Intravenous daptomycin and ceftriaxone through 12/25/2021 per infectious disease             - Disc cultures  NGTD             - MRI lumbar spine 9/17 stable             - Weekly labs to ID, q2week ESR/CRP  -CRP 9/17 0.6  9/27- CRP up to 2.3- will recheck Friday before he leaves to make sure not getting worse.   9/28- will actually check today since confused  -9/29 ESR 26, CRP 22.6 yesterday, ID following- appreciate assistance, repeat MRI with no definitive evidence infection, WBC not elevated, U/A neg, PICC line removed today, ID starting oral linezolid 60110mBID and cefadroxil 1g BID until 12/25/21              - 9/30 - Discussed RUL opacity and clinical respiratory findings with ID; plan to continue current abx as would cover potential aspiration PNA. Nursing aware to contact MD if any vital instability requiring transition to IV abx and fluids. Labs stable.               - 10/1 - respiratory and overall infectious symptoms much improved. Per ID, continue current Tx.   10/2- per ID- on Linezolid til 10/6- off IV ABX per ID- will f/u with ID 10/18-  9.  Tremors.  Mysoline as directed 10.  Tobacco abuse.  Provide counseling 11.  GERD.  Protonix 4022maily 12.  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  Follow-up outpatient  9/27- having testicle phantom pain- hopefully will improve with gabapentin 13. Low albumin. mild 3.4, encourage protein intake  -Added ensure, appears PO intake OK for some meals/ decreased other times 14. Constipation  9/28- cannot find a BM recorded, but I know he was going- might have bene in therapy- will verify with nursing   9/29 LBM yesterday-improved  10/3- LBM yesterday- con't regimen 15. Confusion  9/28- thinks it's due to Increase in Zanaflex done yesterday- will decrease back to 4 mg QID- if doesn't improve, will check U/A and Cx and other ID work up- he tolerated pain meds prior, so think its a combination of those with zanaflex increase and possible infection? Since has low grade temp which is new and confused, will check u/A and  Cx now and monitor- due to osteomyelitis, will also recheck all labs today  9/29 continued to have episodes of confusion last night but appears to be improving             10/1 - persistent mild confusion, however appears improving. Likely combined effects of infection and polypharmacy; as pain control has remained a significant challenge, will allow primary team to further address medications.  10/2- kept pt for at least 1 more day to deal with confusion- it's  improving.   10/3- doing bette-r will d/c tomorrow after reduction of MS Contin  16. Hypokalemia/hyponatremia  -9/29 KCL 67mq x2, salt tabs ordered, recheck tomorrow -> improved            - 9/30 - 10/1 - K, Na stable on current regimen  10/2- Na 131- stable   I spent a total of 51   minutes on total care today- >50% coordination of care- due to d/w wife about medications and confusion that's improving daily; as well as team conference to finalize d/c- also spoke with SW and PA about d/c plans      LOS: 17 days A FACE TO FACE EVALUATION WAS PERFORMED  Pinky Ravan 12/05/2021, 9:59 AM

## 2021-12-05 NOTE — Progress Notes (Signed)
Inpatient Rehabilitation Care Coordinator Discharge Note   Patient Details  Name: Cameron [name] MRN: [id number] Date of Birth: 1950-07-25   Discharge location: D/c to home with support from his wife  Length of Stay: 2 DAYS  Discharge activity level: Mod I to [place]  Home/community participation: Limited  Patient response DJ:MEQAST Literacy - How often do you need to have someone help you when you read instructions, pamphlets, or other written material from your doctor or pharmacy?: Never  Patient response MH:DQQIWL Isolation - How often do you feel lonely or isolated from those around you?: Never  Services provided included: RD, OT, PT, MD, RN, SW, Neuropsych, TR, Pharmacy, CM  Financial Services:  Charity fundraiser Utilized: Multimedia programmer (Medicare Part A) Haematologist offered to/list presented to: Yes  Follow-up services arranged:  [place], DME, Other (Comment) (IV abx with [facility] ([place])) [place]: [place] for HHPT/OT/SN    DME : [place] for RW and [facility]in[facility] BSC    Patient response to transportation need: Is the patient able to respond to transportation needs?: Yes In the past 12 months, has lack of transportation kept you from medical appointments or from getting medications?: No In the past 12 months, has lack of transportation kept you from meetings, work, or from getting things needed for daily living?: No   Comments (or additional information):  Patient/Family verbalized understanding of follow-up arrangements:  Yes  Individual responsible for coordination of the follow-up plan: contact pt or pt wife Cameron [name]  Confirmed correct DME delivered: Rana Snare 12/05/2021    Rana Snare

## 2021-12-05 NOTE — Progress Notes (Signed)
Patient ID: Cameron [name], male   DOB: June 24, 1950, 71 y.o.   MRN: [id number]  Per medical team, pt will d/c to home tomorrow and will no longer be on IV abx. SW updated [facility] with regard to d/c date and new changes. Will need new,updated  orders for PT/OT .   SW met with pt in room to discuss above. SW confirms DME was delivered to room. No questions/concerns reported.  SW called pt wife to inform on above.   Loralee Pacas, MSW, [place] Office: [phone] Cell: [phone] Fax: [phone]

## 2021-12-05 NOTE — Patient Care Conference (Cosign Needed)
Inpatient RehabilitationTeam Conference and Plan of Care Update Date: 12/05/2021   Time: 2:46 PM    Patient Name: Cameron [name]      Medical Record Number: [id number]  Date of Birth: [birth year] Sex: Male         Room/Bed: 4W02C/4W02C-01 Payor Info: Payor: MEDICARE / Plan: MEDICARE PART A / Product Type: *No Product type* /    Admit Date/Time:  11/18/2021  2:46 PM  Primary Diagnosis:  Discitis  Hospital Problems: Principal Problem:   Discitis Active Problems:   Hyponatremia   Hypokalemia   Constipation   Delirium    Expected Discharge Date: Expected Discharge Date: 12/06/21  Team Members Present: Physician leading conference: Dr. Courtney Heys Social Worker Present: Loralee Pacas, [place] Nurse Present: Dorien Chihuahua, RN PT Present: Ailene Rud, PT OT Present: Willeen Cass, OT;Roanna Epley, COTA PPS Coordinator present : Ileana Ladd, PT     Current Status/Progress Goal Weekly Team Focus  Bowel/Bladder   Cont x2 LB- 10/02  Remain cont of b&b  Monitor for changes in b&b   Swallow/Nutrition/ Hydration             ADL's   discharged from OT services at mod I/supervision level of function  mod I overall      Mobility   d/c from therapy, at goal level  mod I overall  pain management   Communication             Safety/Cognition/ Behavioral Observations            Pain   Pain 7/10 on RLE. PRN meds and scheduled meds given.  Decrease in pain to a 2 or less.  Assess q shift and prn.   Skin   No current skin issues.  Remain free of skin breakdown.  Assess qhsift and prn.     Discharge Planning:  Pt will d/c to home with his wife who is the primary caregiver. Pt will no longer require IV abx at d/c. [facility] for HHPT/OT/SN.   Team Discussion: Patient's pain addressed; MD adjusted meds. Confusion improved.  Patient on target to meet rehab goals: yes  *See Care Plan and progress notes for long and short-term goals.   Revisions to Treatment Plan:  N/a    Teaching Needs: Safety, transfers, medications, etc  Current Barriers to Discharge: Decreased caregiver support and acute pain.  Possible Resolutions to Barriers: Medication adjusted per MD Family education DME: RW     Medical Summary Current Status: decreasing MS COntin today to 15 mg BID- taking dilaudid 3x in last 4 days- - confusion is improving, but still an issue  Barriers to Discharge: Behavior;Decreased family/caregiver support;Home enviroment access/layout;Medical stability  Barriers to Discharge Comments: not going home on IV ABX- going on Linezolid- Possible Resolutions to Barriers/Weekly Focus: H/H aftrer d/c- d/c 10/4   Continued Need for Acute Rehabilitation Level of Care: The patient requires daily medical management by a physician with specialized training in physical medicine and rehabilitation for the following reasons: Direction of a multidisciplinary physical rehabilitation program to maximize functional independence : Yes Medical management of patient stability for increased activity during participation in an intensive rehabilitation regime.: Yes Analysis of laboratory values and/or radiology reports with any subsequent need for medication adjustment and/or medical intervention. : Yes   I attest that I was present, lead the team conference, and concur with the assessment and plan of the team.   Dorien Chihuahua B 12/05/2021, 2:46 PM

## 2021-12-06 ENCOUNTER — Other Ambulatory Visit (HOSPITAL_COMMUNITY): Payer: Self-pay

## 2021-12-06 NOTE — Progress Notes (Signed)
PROGRESS NOTE   Subjective/Complaints:  Pain a little more- 6/10 this AM- last dilaudid at 2am- due to reduction of ms contin- but tolerable.   Still having muscle spasms.  Ready for d/c today.  Doesn't feel confused anymore.  ROS:    Pt denies SOB, abd pain, CP, N/V/C/D, and vision changes  except for HPI    Objective:   No results found. No results for input(s): "WBC", "HGB", "HCT", "PLT" in the last 72 hours.   No results for input(s): "NA", "K", "CL", "CO2", "GLUCOSE", "BUN", "CREATININE", "CALCIUM" in the last 72 hours.    Intake/Output Summary (Last 24 hours) at 12/06/2021 0818 Last data filed at 12/06/2021 0431 Gross per 24 hour  Intake 480 ml  Output 2750 ml  Net -2270 ml        Physical Exam: Vital Signs Blood pressure (!) 150/74, pulse 74, temperature 98.1 F (36.7 C), resp. rate 15, height 5' 9" (1.753 m), weight 72.6 kg, SpO2 90 %.        General: awake, alert, appropriate, [place] drawl still evident; supine in bed; but moving easier; NAD HENT: conjugate gaze; oropharynx moist CV: regular rate; no JVD Pulmonary: CTA B/L; no W/R/R- good air movement GI: soft, NT, ND, (+)BS Psychiatric: appropriate Neurological: Ox3 Allodynia RLE especially along medial knee - consistent with prior exams. No clonus on ankle jerk, negative babinski bilaterally.  No more muscle jerking going on- that jerks his whole body  PE from prior encounter: Skin: C/D/I. No apparent lesions. Back biopsy area healed.  IRW:ERXV is tender with palpation.       No apparent deformity.      Strength:                RUE: 5/5 SA, 5/5 EF, 5/5 EE, 5/5 WE, 5/5 FF, 5/5 FA                 LUE: 5/5 SA, 5/5 EF, 5/5 EE, 5/5 WE, 5/5 FF, 5/5 FA                 RLE: 2/5 HF, 3/5 KE, 4/5 DF, 1/5 EHL (congenital), 4/5 PF  - limited by pain                 LLE:  5/5 HF, 5/5 KE, 5/5 DF, 5/5 EHL, 5/5 PF    Neurologic exam:   Cognition:  Alert and oriented to person, time, and place.  Cranial nerve exam unremarkable  Sensation: Altered/sensitive to light touch over medial right knee following L3 dermatome. Allodynia in RLE   Assessment/Plan: 1. Functional deficits which require 3+ hours per day of interdisciplinary therapy in a comprehensive inpatient rehab setting. Physiatrist is providing close team supervision and 24 hour management of active medical problems listed below. Physiatrist and rehab team continue to assess barriers to discharge/monitor patient progress toward functional and medical goals  Care Tool:  Bathing    Body parts bathed by patient: Right arm, Left arm, Left upper leg, Chest, Abdomen, Front perineal area, Buttocks, Right upper leg, Face, Right lower leg, Left lower leg     Body parts n/a: Right lower leg, Left lower leg  Bathing assist Assist Level: Independent with assistive device     Upper Body Dressing/Undressing Upper body dressing   What is the patient wearing?: Pull over shirt    Upper body assist Assist Level: Independent Assistive Device Comment: bed level  Lower Body Dressing/Undressing Lower body dressing      What is the patient wearing?: Underwear/pull up, Pants     Lower body assist Assist for lower body dressing: Independent with assitive device     Toileting Toileting    Toileting assist Assist for toileting: Independent with assistive device Assistive Device Comment: urinal   Transfers Chair/bed transfer  Transfers assist  Chair/bed transfer activity did not occur: Safety/medical concerns (unable to perform due to radiating back pain)  Chair/bed transfer assist level: Independent with assistive device Chair/bed transfer assistive device: Programmer, multimedia   Ambulation assist   Ambulation activity did not occur: Safety/medical concerns (unable to perform due to radiating back pain)  Assist level: Independent with assistive  device Assistive device: Walker-rolling Max distance: 390   Walk 10 feet activity   Assist  Walk 10 feet activity did not occur: Safety/medical concerns (unable to perform due to radiating back pain)  Assist level: Independent with assistive device Assistive device: Walker-rolling   Walk 50 feet activity   Assist Walk 50 feet with 2 turns activity did not occur: Safety/medical concerns  Assist level: Independent with assistive device Assistive device: Walker-rolling    Walk 150 feet activity   Assist Walk 150 feet activity did not occur: Safety/medical concerns  Assist level: Independent with assistive device Assistive device: Walker-rolling    Walk 10 feet on uneven surface  activity   Assist Walk 10 feet on uneven surfaces activity did not occur: Safety/medical concerns   Assist level: Independent with assistive device Assistive device: Walker-rolling   Wheelchair     Assist Is the patient using a wheelchair?: No Type of Wheelchair: Manual Wheelchair activity did not occur: Safety/medical concerns (unable to perform due to radiating back pain)  Wheelchair assist level: Independent      Wheelchair 50 feet with 2 turns activity    Assist    Wheelchair 50 feet with 2 turns activity did not occur: Safety/medical concerns (unable to perform due to radiating back pain)   Assist Level: Independent   Wheelchair 150 feet activity     Assist  Wheelchair 150 feet activity did not occur: Safety/medical concerns (unable to perform due to radiating back pain)   Assist Level: Independent   Blood pressure (!) 150/74, pulse 74, temperature 98.1 F (36.7 C), resp. rate 15, height 5' 9" (1.753 m), weight 72.6 kg, SpO2 90 %.  Medical Problem List and Plan: 1. 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  Status post L2-3 disc aspiration per interventional radiology.  Blood cultures no growth to date.  Decadron 4 mg  every 12 hours x2 days then 4 mg x 2 days and stop.             -patient may shower             -ELOS/Goals: 10-14 days   D/c today- will need f/u with ID as well as Dr Dagoberto Ligas 2.  Antithrombotics: -DVT/anticoagulation:  Mechanical: Antiembolism stockings, thigh (TED hose) Bilateral lower extremities             -antiplatelet therapy: none   3. Pain Management: Fentanyl patch/Duragesic patch, Neurontin 300 mg 3 times daily, Dilaudid 4 mg every 4 hours as needed  pain, Robaxin as needed              - Zanaflex 4 mg TID instead of robaxin per pt preference              - Increase gabapentin to 600 mg TID for RLE neuropathic pain ; BUN/Cr stable 9/17              - Last dose Decadron 9/17              - Tried baclofen 10 mg; Dced d/t no effect              - Valium 2 mg Q6H PRN added, given relative spasm-free intervals following QHS clonazepam - wean as tolerated              - Dilaudid 1 mg IV given 1x for imaging; reinforced to patient no further IV. Note, some nursing concern to seeking behavior.              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 - Concern to complete ESI given possible discitis/osteomyelitis,   -9/19 increase gabapentin to 864m TID,  continue fentanyl 12 patch, increase zanaflex to 436mQID for muscle spasms  9/20 increase fentanyl to 2526m called NSGY to discuss  9/21 Fentanyl patch has fallen off, will replace, Spoke with NSGY, would hold off on injection due to concern of infection  9/22 will change zoloft to cymbalta 26m11mily  9/23 pt having issues with adherence of fentanyl patch   -will dc and try MS contin 26mg7m   -change tizanidine to q6 hours  9/24 pt feeling better with Ms Contin 26mg 12m-continue   -q6 tizanidine helped with sleep and spasms as well  9/25-will increase gabapentin to  1200 mg TID- and cymbalta will increase to 60 mg on Friday-   9/26- pt only taken Dilaudid 3x in last 28+ hours- will encourage him to take more.   9/27- will increase Zanaflex 6 mg QID- and pt taking prn dilaudid more- doing somewhat better  9/28- hasn't received any pain meds overnight- Zanaflex could be making him confused, so lowered back top previous dose.              9/30 - Mild ongoing confusion, likely exacerbated by infection            10/1 - S/s infection much improved; mild confusion still present  10/2- pain doing much better per pt- will con't regimen, since I don't think it's causing confusion, but see if can have him take prns less.   10/3- only taken dilaudid 3x in last 4 days- will reduce MS Contin to 15 mg BID today   10/4- wil send home with 7 days of meds- will need f/u with me and to call my office for refills.  4. Mood/Behavior/Sleep: Klonopin 0.25 mg nightly, Zoloft 50 mg daily             -antipsychotic agents: N/A             - DC klonopin (home med) given PRN valium above; pt agreeable  -Continue valium 2mg [id number]  -appreciate neuropsych input  5. Neuropsych/cognition: This patient is capable of making decisions on his own behalf. 6. Skin/Wound Care: Routine  skin checks 7. Fluids/Electrolytes/Nutrition: Routine in and outs with follow-up chemistries             - CMP stable 9/18 8.  ID/discitis/osteomyelitis/fever.  Intravenous daptomycin and ceftriaxone through 12/25/2021 per infectious disease             - Disc cultures NGTD             - MRI lumbar spine 9/17 stable             - Weekly labs to ID, q2week ESR/CRP  -CRP 9/17 0.6  9/27- CRP up to 2.3- will recheck Friday before he leaves to make sure not getting worse.   9/28- will actually check today since confused  -9/29 ESR 26, CRP 22.6 yesterday, ID following- appreciate assistance, repeat MRI with no definitive evidence infection, WBC not elevated, U/A neg, PICC line removed today, ID starting oral  linezolid 674m BID and cefadroxil 1g BID until 12/25/21              - 9/30 - Discussed RUL opacity and clinical respiratory findings with ID; plan to continue current abx as would cover potential aspiration PNA. Nursing aware to contact MD if any vital instability requiring transition to IV abx and fluids. Labs stable.               - 10/1 - respiratory and overall infectious symptoms much improved. Per ID, continue current Tx.   10/2- per ID- on Linezolid til 10/6- off IV ABX per ID- will f/u with ID 10/18-  9.  Tremors.  Mysoline as directed 10.  Tobacco abuse.  Provide counseling 11.  GERD.  Protonix 48mdaily 12.  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  Follow-up outpatient  9/27- having testicle phantom pain- hopefully will improve with gabapentin 13. Low albumin. mild 3.4, encourage protein intake  -Added ensure, appears PO intake OK for some meals/ decreased other times 14. Constipation  9/28- cannot find a BM recorded, but I know he was going- might have bene in therapy- will verify with nursing   9/29 LBM yesterday-improved  10/3- LBM yesterday- con't regimen 15. Confusion  9/28- thinks it's due to Increase in Zanaflex done yesterday- will decrease back to 4 mg QID- if doesn't improve, will check U/A and Cx and other ID work up- he tolerated pain meds prior, so think its a combination of those with zanaflex increase and possible infection? Since has low grade temp which is new and confused, will check u/A and Cx now and monitor- due to osteomyelitis, will also recheck all labs today  9/29 continued to have episodes of confusion last night but appears to be improving             10/1 - persistent mild confusion, however appears improving. Likely combined effects of infection and polypharmacy; as pain control has remained a significant challenge, will allow primary team to further address medications.   10/2- kept pt for at least 1 more day to deal  with confusion- it's  improving.   10/3- doing bette-r will d/c tomorrow after reduction of MS Contin  16. Hypokalemia/hyponatremia  -9/29 KCL [id number], salt tabs ordered, recheck tomorrow -> improved            - 9/30 - 10/1 - K, Na stable on current regimen  10/2- Na 131- stable       LOS: 18 days A FACE TO FACE EVALUATION WAS PERFORMED  Megan Lovorn  12/06/2021, 8:18 AM

## 2021-12-06 NOTE — Progress Notes (Signed)
Inpatient Rehabilitation Discharge Medication Review by a Pharmacist  A complete drug regimen review was completed for this patient to identify any potential clinically significant medication issues.  High Risk Drug Classes Is patient taking? Indication by Medication  Antipsychotic No   Anticoagulant No   Antibiotic Yes,  Linezolid for discitis/OM  Opioid Yes Dilaudid, Morphine CR for pain  Antiplatelet No   Hypoglycemics/insulin No   Vasoactive Medication No   Chemotherapy No   Other Yes Diazepam for muscle spasms Cymbalta for pain/depression Gabapentin for pain Omeprazole for GERD Primidone for tremors Zanaflex for muscle spasms Diclofenac patch for pain Miralax for constipation Crestor-hyperlipidemia     Type of Medication Issue Identified Description of Issue Recommendation(s)  Drug Interaction(s) (clinically significant)     Duplicate Therapy     Allergy     No Medication Administration End Date     Incorrect Dose     Additional Drug Therapy Needed     Significant med changes from prior encounter (inform family/care partners about these prior to discharge).    Other       Clinically significant medication issues were identified that warrant physician communication and completion of prescribed/recommended actions by midnight of the next day:  No  Name of provider notified for urgent issues identified:   Provider Method of Notification:   Pharmacist comments:   Time spent performing this drug regimen review (minutes):  [place], RPh Clinical Pharmacist  12/06/2021 8:15 AM

## 2021-12-08 ENCOUNTER — Telehealth: Payer: Self-pay | Admitting: Neurology

## 2021-12-08 NOTE — Telephone Encounter (Signed)
Patient has recently had a  back surgery and was sent home with heavy medications for pain including Valium , Morphine , Dilaudid , and Tizanidine patients wife thinks PCP didn't know exactly what to do with the medications and injury so instructed her to call Dr. Carles Collet . Patients wife did inform PCP that Dr. Carles Collet sees patient for tremors not his back surgery as she is a movement disorder Dr. not a surgeon but they instructed her to call our office. Patient has a  follow up with the Dr at the hospital that did the surgery and th e rehab the patient was in after the surgery. Patients wife also told me she has those numbers and will call them with questions but did want to do what her PCP told her to do

## 2021-12-08 NOTE — Telephone Encounter (Signed)
Pt's wife called in and left a message with the access nurse. She stated the pt was just in the hospital for a back injury. They wanted to double check with Dr. Carles Collet about the medications he was put on. She states his back is not improving since coming home.

## 2021-12-18 ENCOUNTER — Telehealth: Payer: Self-pay

## 2021-12-18 NOTE — Telephone Encounter (Signed)
Transitional Care call--talked w/ Hassan Rowan     Are you/is patient experiencing any problems since coming home? Are there any questions regarding any aspect of care? Not as much pain but back pain is still present. Are there any questions regarding medications administration/dosing? Are meds being taken as prescribed? Patient should review meds with caller to confirm, no questions taking medicines as prescribed  Have there been any falls? 1 Has Home Health been to the house and/or have they contacted you? Already started If not, have you tried to contact them? Can we help you contact them? Are bowels and bladder emptying properly? Are there any unexpected incontinence issues? If applicable, is patient following bowel/bladder programs? No  Any fevers, problems with breathing, unexpected pain?  No issues Are there any skin problems or new areas of breakdown? no Has the patient/family member arranged specialty MD follow up (ie cardiology/neurology/renal/surgical/etc)?  Can we help arrange? no Does the patient need any other services or support that we can help arrange? no Are caregivers following through as expected in assisting the patient? yes Has the patient quit smoking, drinking alcohol, or using drugs as recommended? yes  Appointment time, arrive time and who it is with here [address] [phone]

## 2021-12-20 ENCOUNTER — Encounter: Payer: Self-pay | Admitting: Internal Medicine

## 2021-12-20 ENCOUNTER — Ambulatory Visit (INDEPENDENT_AMBULATORY_CARE_PROVIDER_SITE_OTHER): Payer: POS | Admitting: Internal Medicine

## 2021-12-20 ENCOUNTER — Other Ambulatory Visit: Payer: Self-pay

## 2021-12-20 DIAGNOSIS — M4646 Discitis, unspecified, lumbar region: Secondary | ICD-10-CM | POA: Diagnosis not present

## 2021-12-20 NOTE — Progress Notes (Signed)
[facility]  CHIEF COMPLAINT:    Follow up for possible discitis  SUBJECTIVE:    Cameron [name] is a 71 y.o. male with PMHx as below who presents to the clinic for possible discitis.   Patient was admitted at [facility] with acute and severe back pain.  An MRI was done which raised the question of discitis at L2-3 vs degenerative disease and radiculopathy.  Blood and disc space aspiration cultures were negative.  Patient was initially seen by my partner, Dr West Bali, whom recommended 6-8 weeks of IV daptomycin and ceftriaxone via PICC line.  He was then sent to [facility] for further rehab.  An MRI was repeated on 11/19/21 with decreased fluid within L2-3 disc space with no progressive findings.  This was interpreted as indeterminate for discitis/OM vs degenerative change.  He was progressing slowly in rehab when he developed fevers.  I was consulted on patient at that time.  A repeat MRI was obtained given the new onset fever which was most notable for no definite evidence of infection with further decrease in fluid signal in the L2-L3 disc space and only minimal endplate enhancement.  There was no evidence of epidural abscess.  Ultimately, his PICC line was removed as there was concern that this might have been a nidus for his fevers.  This was done on 12/01/2021 and he was transitioned to several days of oral antibiotics.  A CXR as part of the work up was done which showed a possible pneumonia that would have adequately been treated with the oral antibiotics.  He also dealt with confusion in rehab that was likely due to pain medications.    A lengthy discussion was had with patient during his admission regarding antibiotics and follow up.  Shared decision was made to stop antibiotics given the uncertainty surrounding diagnosis of discitis/OM and obtain a short interval MRI to assess for interval changes that would suggest relapsed infection.  Patient has been  discharged from [facility] as of 12/06/21.  His back pain is slowly improving overall.  He is working with therapy and becoming more independent with activities.  He is still requiring pain medications.  He has been afebrile.    Please see A&P for the details of today's visit and status of the patient's medical problems.   Patient's Medications  New Prescriptions   No medications on file  Previous Medications   DIAZEPAM (VALIUM) 2 MG TABLET    Take 1 tablet (2 mg total) by mouth every 6 (six) hours as needed for muscle spasms.   DICLOFENAC (FLECTOR) 1.3 % PTCH    Place 1 patch onto the skin 2 (two) times daily.   DULOXETINE (CYMBALTA) 60 MG CAPSULE    Take 1 capsule (60 mg total) by mouth daily.   GABAPENTIN (NEURONTIN) 600 MG TABLET    Take 2 tablets (1,200 mg total) by mouth 3 (three) times daily.   HYDROMORPHONE (DILAUDID) 4 MG TABLET    Take 1 tablet (4 mg total) by mouth every 4 (four) hours as needed for severe pain (and breakthrough pain).   MORPHINE (MS CONTIN) 15 MG 12 HR TABLET    Take 1 tablet (15 mg total) by mouth every 12 (twelve) hours.   OMEPRAZOLE (PRILOSEC OTC) 20 MG TABLET    Take 20 mg by mouth daily.   POLYETHYLENE GLYCOL (MIRALAX / GLYCOLAX) 17 G PACKET    Take 17 g by mouth 2 (two) times  daily.   PRIMIDONE (MYSOLINE) 50 MG TABLET    TAKE 3 TABLETS BY MOUTH EVERY MORNING, 2 TABLET IN THE AFTERNOON AND 3 IN THE EVENING   ROSUVASTATIN (CRESTOR) 20 MG TABLET    Take 1 tablet (20 mg total) by mouth daily.   TIZANIDINE (ZANAFLEX) 4 MG TABLET    Take 1 tablet (4 mg total) by mouth every 6 (six) hours.  Modified Medications   No medications on file  Discontinued Medications   No medications on file      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   Insomnia    Nocturia    Tobacco abuse    Varicocele    Wears glasses    for reading    Social History   Tobacco Use   Smoking status: Every Day    Packs/day: 1.50    Years: 40.00    Total pack years: 60.00    Types: Cigarettes   Smokeless tobacco: Current    Types: Chew   Tobacco comments:    Started smoking at age 88.  Currently smoking 1 1/2 ppd. Uses chewing tobacco once weekly.     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    OBJECTIVE:    Vitals:   12/20/21 1528  BP: (!) 94/57  Pulse: 76  Resp: 16  Temp: 97.9 F (36.6 C)  TempSrc: Oral  SpO2: 95%   There is no height or weight on file to calculate BMI.  Physical Exam Constitutional:      Appearance: Normal appearance.  HENT:     Head: Normocephalic and atraumatic.  Pulmonary:     Effort: Pulmonary effort is normal. No respiratory distress.  Musculoskeletal:     Right lower leg: No edema.     Left lower leg: No edema.  Skin:    General: Skin is warm and dry.  Neurological:     General: No focal deficit present.     Mental Status: He is alert and oriented to person, place, and time.      Labs and Microbiology:    Latest Ref Rng & Units 12/03/2021    6:37 AM 12/02/2021   10:49 AM 12/01/2021    3:46 AM  CBC  WBC 4.0 - 10.5 K/uL 5.4  7.8  6.9    Hemoglobin 13.0 - 17.0 g/dL 10.3  11.1  10.5   Hematocrit 39.0 - 52.0 % 30.0  31.5  30.9   Platelets 150 - 400 K/uL 208  213  177       Latest Ref Rng & Units 12/03/2021    6:37 AM 12/02/2021   10:49 AM 12/01/2021    3:46 AM  CMP  Glucose 70 - 99 mg/dL 108  104  112   BUN 8 - 23 mg/dL '7  10  11   '$ Creatinine 0.61 - 1.24 mg/dL 0.93  0.98  1.10   Sodium 135 - 145 mmol/L 131  131  130   Potassium 3.5 - 5.1 mmol/L 4.0  4.2  3.3   Chloride 98 - 111 mmol/L 94  94  94   CO2 22 - 32 mmol/L '28  28  28   '$ Calcium 8.9 - 10.3 mg/dL 8.7  8.9  8.5   Total Protein 6.5 - 8.1 g/dL   5.8   Total Bilirubin 0.3 - 1.2 mg/dL   0.3   Alkaline Phos 38 - 126 U/L   70   AST 15 - 41 U/L   47   ALT 0 - 44 U/L   47       ASSESSMENT & PLAN:    Discitis Patient with no new concerns on symptoms that would suggest worsening infectious process since stopping antibiotics a couple weeks ago.  Will plan for MRI in about 2 weeks to reassess for discitis/OM changes and ensure no progression or development of an abscess.  Follow up pending this imaging result.   Orders Placed This Encounter  Procedures   MR Lumbar Spine W Wo Contrast    Standing Status:   Future    Standing Expiration Date:   12/21/2022    Order Specific Question:   If indicated for the ordered procedure, I authorize the administration of contrast media per Radiology protocol    Answer:   Yes    Order Specific Question:   What is the patient's sedation requirement?    Answer:   No Sedation    Order Specific Question:   Does the patient have a pacemaker or implanted devices?    Answer:   No    Order Specific Question:   Preferred imaging location?    Answer:   [facility] (table limit - 500 lbs)       [facility] 12/20/2021, 3:51 PM   I have personally spent 40 minutes involved in face-to-face and non-face-to-face activities for this patient on the day of the  visit. Professional time spent includes the following activities: Preparing to see the patient (review of tests), Obtaining and/or reviewing separately obtained history (admission/discharge record), Performing a medically appropriate examination and/or evaluation , Ordering medications/tests/procedures, referring and communicating with other health care professionals, Documenting clinical information in the EMR, Independently interpreting results (not separately reported), Communicating results to the patient/family/caregiver, Counseling and educating the patient/family/caregiver and Care coordination (not separately reported).

## 2021-12-20 NOTE — Assessment & Plan Note (Signed)
Patient with no new concerns on symptoms that would suggest worsening infectious process since stopping antibiotics a couple weeks ago.  Will plan for MRI in about 2 weeks to reassess for discitis/OM changes and ensure no progression or development of an abscess.  Follow up pending this imaging result.

## 2021-12-22 ENCOUNTER — Other Ambulatory Visit: Payer: Self-pay | Admitting: Cardiology

## 2021-12-22 DIAGNOSIS — E782 Mixed hyperlipidemia: Secondary | ICD-10-CM

## 2021-12-30 ENCOUNTER — Ambulatory Visit (HOSPITAL_COMMUNITY)
Admission: RE | Admit: 2021-12-30 | Discharge: 2021-12-30 | Disposition: A | Discharge status: Home / Self Care | Payer: POS | Source: Ambulatory Visit | Attending: Internal Medicine | Admitting: Internal Medicine

## 2021-12-30 DIAGNOSIS — M4646 Discitis, unspecified, lumbar region: Secondary | ICD-10-CM

## 2021-12-30 MED ORDER — GADOBUTROL 1 MMOL/ML IV SOLN
7.5000 mL | Freq: Once | INTRAVENOUS | Status: AC | PRN
  Administered 2021-12-30: 7.5 mL via INTRAVENOUS

## 2022-01-04 NOTE — Telephone Encounter (Signed)
-----   Message from Mignon Pine, DO sent at 01/03/2022  3:45 PM EDT ----- Can you please let patient know that follow up MRI done showed stable findings and consistent with degenerative changes.  Not suggestive of infection and no further need to follow up with ID.  Thanks, Mitzi Hansen

## 2022-01-09 ENCOUNTER — Inpatient Hospital Stay (HOSPITAL_COMMUNITY)
Admission: EM | Admit: 2022-01-09 | Discharge: 2022-01-12 | DRG: 373 | Disposition: A | Discharge status: Home / Self Care | Payer: Medicare Other | Attending: Internal Medicine | Admitting: Internal Medicine

## 2022-01-09 ENCOUNTER — Emergency Department (HOSPITAL_COMMUNITY): Payer: Medicare Other

## 2022-01-09 ENCOUNTER — Encounter (HOSPITAL_COMMUNITY): Payer: Self-pay | Admitting: Internal Medicine

## 2022-01-09 DIAGNOSIS — Z8616 Personal history of COVID-19: Secondary | ICD-10-CM

## 2022-01-09 DIAGNOSIS — Z792 Long term (current) use of antibiotics: Secondary | ICD-10-CM

## 2022-01-09 DIAGNOSIS — G4700 Insomnia, unspecified: Secondary | ICD-10-CM | POA: Diagnosis present

## 2022-01-09 DIAGNOSIS — N400 Enlarged prostate without lower urinary tract symptoms: Secondary | ICD-10-CM

## 2022-01-09 DIAGNOSIS — Z9049 Acquired absence of other specified parts of digestive tract: Secondary | ICD-10-CM

## 2022-01-09 DIAGNOSIS — F17210 Nicotine dependence, cigarettes, uncomplicated: Secondary | ICD-10-CM | POA: Diagnosis present

## 2022-01-09 DIAGNOSIS — M1990 Unspecified osteoarthritis, unspecified site: Secondary | ICD-10-CM

## 2022-01-09 DIAGNOSIS — I951 Orthostatic hypotension: Secondary | ICD-10-CM | POA: Insufficient documentation

## 2022-01-09 DIAGNOSIS — A0472 Enterocolitis due to Clostridium difficile, not specified as recurrent: Secondary | ICD-10-CM | POA: Diagnosis not present

## 2022-01-09 DIAGNOSIS — E860 Dehydration: Secondary | ICD-10-CM | POA: Diagnosis present

## 2022-01-09 DIAGNOSIS — K219 Gastro-esophageal reflux disease without esophagitis: Secondary | ICD-10-CM | POA: Diagnosis not present

## 2022-01-09 DIAGNOSIS — G8929 Other chronic pain: Secondary | ICD-10-CM | POA: Diagnosis present

## 2022-01-09 DIAGNOSIS — E869 Volume depletion, unspecified: Secondary | ICD-10-CM | POA: Insufficient documentation

## 2022-01-09 DIAGNOSIS — J449 Chronic obstructive pulmonary disease, unspecified: Secondary | ICD-10-CM | POA: Diagnosis not present

## 2022-01-09 DIAGNOSIS — Z8547 Personal history of malignant neoplasm of testis: Secondary | ICD-10-CM

## 2022-01-09 DIAGNOSIS — M549 Dorsalgia, unspecified: Secondary | ICD-10-CM | POA: Diagnosis present

## 2022-01-09 DIAGNOSIS — Z79899 Other long term (current) drug therapy: Secondary | ICD-10-CM

## 2022-01-09 LAB — CBC WITH DIFFERENTIAL/PLATELET
Abs Immature Granulocytes: 0.12 10*3/uL — ABNORMAL HIGH (ref 0.00–0.07)
Basophils Absolute: 0.1 10*3/uL (ref 0.0–0.1)
Basophils Relative: 0 %
Eosinophils Absolute: 0.2 10*3/uL (ref 0.0–0.5)
Eosinophils Relative: 2 %
HCT: 31.2 % — ABNORMAL LOW (ref 39.0–52.0)
Hemoglobin: 10.7 g/dL — ABNORMAL LOW (ref 13.0–17.0)
Immature Granulocytes: 1 %
Lymphocytes Relative: 14 %
Lymphs Abs: 1.7 10*3/uL (ref 0.7–4.0)
MCH: 31.9 pg (ref 26.0–34.0)
MCHC: 34.3 g/dL (ref 30.0–36.0)
MCV: 93.1 fL (ref 80.0–100.0)
Monocytes Absolute: 1.2 10*3/uL — ABNORMAL HIGH (ref 0.1–1.0)
Monocytes Relative: 10 %
Neutro Abs: 8.7 10*3/uL — ABNORMAL HIGH (ref 1.7–7.7)
Neutrophils Relative %: 73 %
Platelets: 210 10*3/uL (ref 150–400)
RBC: 3.35 MIL/uL — ABNORMAL LOW (ref 4.22–5.81)
RDW: 13.3 % (ref 11.5–15.5)
WBC: 11.8 10*3/uL — ABNORMAL HIGH (ref 4.0–10.5)
nRBC: 0 % (ref 0.0–0.2)

## 2022-01-09 LAB — COMPREHENSIVE METABOLIC PANEL
ALT: 17 U/L (ref 0–44)
AST: 16 U/L (ref 15–41)
Albumin: 2.7 g/dL — ABNORMAL LOW (ref 3.5–5.0)
Alkaline Phosphatase: 47 U/L (ref 38–126)
Anion gap: 9 (ref 5–15)
BUN: 9 mg/dL (ref 8–23)
CO2: 29 mmol/L (ref 22–32)
Calcium: 8.7 mg/dL — ABNORMAL LOW (ref 8.9–10.3)
Chloride: 97 mmol/L — ABNORMAL LOW (ref 98–111)
Creatinine, Ser: 1.15 mg/dL (ref 0.61–1.24)
GFR, Estimated: >60 mL/min (ref >60)
Glucose, Bld: 103 mg/dL — ABNORMAL HIGH (ref 70–99)
Potassium: 3.8 mmol/L (ref 3.5–5.1)
Sodium: 135 mmol/L (ref 135–145)
Total Bilirubin: 0.3 mg/dL (ref 0.3–1.2)
Total Protein: 6 g/dL — ABNORMAL LOW (ref 6.5–8.1)

## 2022-01-09 LAB — C DIFFICILE QUICK SCREEN W PCR REFLEX
C Diff antigen: POSITIVE — AB
C Diff interpretation: DETECTED
C Diff toxin: POSITIVE — AB

## 2022-01-09 LAB — LIPASE, BLOOD: Lipase: 23 U/L (ref 11–51)

## 2022-01-09 LAB — LACTIC ACID, PLASMA: Lactic Acid, Venous: 1 mmol/L (ref 0.5–1.9)

## 2022-01-09 MED ORDER — ONDANSETRON HCL 4 MG/2ML IJ SOLN: 4.0000 mg | Freq: Four times a day (QID) | INTRAMUSCULAR | Status: DC | PRN

## 2022-01-09 MED ORDER — MORPHINE SULFATE ER 15 MG PO TBCR
15.0000 mg | EXTENDED_RELEASE_TABLET | Freq: Two times a day (BID) | ORAL | Status: DC
  Administered 2022-01-10 – 2022-01-12 (×5): 15 mg via ORAL
  Filled 2022-01-09 (×7): qty 1

## 2022-01-09 MED ORDER — IOHEXOL 350 MG/ML SOLN
75.0000 mL | Freq: Once | INTRAVENOUS | Status: AC | PRN
  Administered 2022-01-09: 75 mL via INTRAVENOUS

## 2022-01-09 MED ORDER — LACTATED RINGERS IV BOLUS
1000.0000 mL | Freq: Once | INTRAVENOUS | Status: AC
  Administered 2022-01-09: 1000 mL via INTRAVENOUS

## 2022-01-09 MED ORDER — DULOXETINE HCL 60 MG PO CPEP
60.0000 mg | ORAL_CAPSULE | Freq: Every day | ORAL | Status: DC
  Administered 2022-01-10 – 2022-01-12 (×3): 60 mg via ORAL
  Filled 2022-01-09 (×3): qty 1

## 2022-01-09 MED ORDER — GABAPENTIN 300 MG PO CAPS
1200.0000 mg | ORAL_CAPSULE | Freq: Three times a day (TID) | ORAL | Status: DC
  Filled 2022-01-09: qty 4

## 2022-01-09 MED ORDER — MORPHINE SULFATE (PF) 2 MG/ML IV SOLN: 2.0000 mg | INTRAVENOUS | Status: DC | PRN

## 2022-01-09 MED ORDER — ENOXAPARIN SODIUM 40 MG/0.4ML IJ SOSY
40.0000 mg | PREFILLED_SYRINGE | INTRAMUSCULAR | Status: DC
  Administered 2022-01-09 – 2022-01-11 (×3): 40 mg via SUBCUTANEOUS
  Filled 2022-01-09 (×3): qty 0.4

## 2022-01-09 MED ORDER — VANCOMYCIN HCL 125 MG PO CAPS
125.0000 mg | ORAL_CAPSULE | Freq: Four times a day (QID) | ORAL | Status: DC
  Administered 2022-01-09 – 2022-01-12 (×10): 125 mg via ORAL
  Filled 2022-01-09 (×16): qty 1

## 2022-01-09 MED ORDER — DIAZEPAM 2 MG PO TABS: 2.0000 mg | ORAL_TABLET | Freq: Four times a day (QID) | ORAL | Status: DC | PRN

## 2022-01-09 MED ORDER — VANCOMYCIN HCL 125 MG PO CAPS
125.0000 mg | ORAL_CAPSULE | Freq: Four times a day (QID) | ORAL | Status: DC
  Filled 2022-01-09 (×2): qty 1

## 2022-01-09 MED ORDER — ACETAMINOPHEN 650 MG RE SUPP: 650.0000 mg | Freq: Four times a day (QID) | RECTAL | Status: DC | PRN

## 2022-01-09 MED ORDER — HYDROMORPHONE HCL 2 MG PO TABS: 4.0000 mg | ORAL_TABLET | ORAL | Status: DC | PRN

## 2022-01-09 MED ORDER — PRIMIDONE 50 MG PO TABS: 100.0000 mg | ORAL_TABLET | ORAL | Status: DC

## 2022-01-09 MED ORDER — VANCOMYCIN HCL 250 MG PO CAPS
500.0000 mg | ORAL_CAPSULE | Freq: Four times a day (QID) | ORAL | Status: DC
  Filled 2022-01-09 (×3): qty 2

## 2022-01-09 MED ORDER — LACTATED RINGERS IV SOLN: INTRAVENOUS | Status: DC

## 2022-01-09 MED ORDER — ACETAMINOPHEN 325 MG PO TABS
650.0000 mg | ORAL_TABLET | Freq: Four times a day (QID) | ORAL | Status: DC | PRN
  Administered 2022-01-10: 650 mg via ORAL
  Filled 2022-01-09: qty 2

## 2022-01-09 MED ORDER — ALBUTEROL SULFATE (2.5 MG/3ML) 0.083% IN NEBU: 2.5000 mg | INHALATION_SOLUTION | RESPIRATORY_TRACT | Status: DC | PRN

## 2022-01-09 MED ORDER — METRONIDAZOLE 500 MG/100ML IV SOLN: 500.0000 mg | Freq: Three times a day (TID) | INTRAVENOUS | Status: DC

## 2022-01-09 MED ORDER — GABAPENTIN 300 MG PO CAPS
600.0000 mg | ORAL_CAPSULE | Freq: Three times a day (TID) | ORAL | Status: DC
  Administered 2022-01-09 – 2022-01-12 (×8): 600 mg via ORAL
  Filled 2022-01-09 (×8): qty 2

## 2022-01-09 MED ORDER — ONDANSETRON HCL 4 MG PO TABS: 4.0000 mg | ORAL_TABLET | Freq: Four times a day (QID) | ORAL | Status: DC | PRN

## 2022-01-09 MED ORDER — ROSUVASTATIN CALCIUM 20 MG PO TABS
20.0000 mg | ORAL_TABLET | Freq: Every day | ORAL | Status: DC
  Administered 2022-01-10 – 2022-01-12 (×3): 20 mg via ORAL
  Filled 2022-01-09 (×3): qty 1

## 2022-01-09 NOTE — ED Provider Notes (Signed)
Emergency Department Provider Note   I have reviewed the triage vital signs and the nursing notes.   HISTORY  Chief Complaint Diarrhea and Hypotension   HPI Cameron [name] is a 71 y.o. male with past medical history of COPD, chronic abdominal pain, recent admit for concern for discitis requiring multiple antibiotics which were completed approximately 3 weeks ago presents to the emergency department with severe abdominal pain and multiple episodes of nonbloody diarrhea.  He describes "yellow, slimy" stools sometimes up to 6-8 times per day.  He has had 3 to 4 days of especially bad diarrhea.  He has nausea but no vomiting.  He has ongoing pain in his lower back but his abdominal pain has been worse in the past several days, which the wife states is very unusual.  He does not follow with gastroenterology.  He has not had fevers or chills.  He has started taking a probiotic after his prolonged antibiotic course. No CP or SOB.  Ultimately, symptoms progressed to the point where he required EMS evaluation when he began to experience lightheadedness.  They arrived on scene to find him with blood pressure in the 60/40 range which improved en route with IVF.    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 Positive dizziness.  Cardiovascular: Denies  chest pain. Respiratory: Denies shortness of breath. Gastrointestinal: Positive abdominal pain. Positive nausea, no vomiting. Positive diarrhea.  No constipation. Genitourinary: Negative for dysuria. Musculoskeletal: Negative for back pain. Skin: Negative for rash. Neurological: Negative for headaches.  ____________________________________________   PHYSICAL EXAM:  VITAL SIGNS: ED Triage Vitals  Enc Vitals Group     BP 01/09/22 1252 (!) 120/58     Pulse Rate 01/09/22 1252 65     Resp 01/09/22 1252 16     Temp 01/09/22 1252 98.1 F (36.7 C)     Temp src --      SpO2 01/09/22 1252 98 %     Weight 01/09/22 1251 153 lb (69.4 kg)     Height 01/09/22 1251 '5\' 9"'$  (1.753 m)   Constitutional: Alert and oriented. Well appearing and in no acute distress. Eyes: Conjunctivae are normal. Head: Atraumatic. Nose: No congestion/rhinnorhea. Mouth/Throat: Mucous membranes are dry.  Neck: No stridor.  Cardiovascular: Normal rate, regular rhythm. Good peripheral circulation. Grossly normal heart sounds.   Respiratory: Normal respiratory effort.  No retractions. Lungs CTAB. Gastrointestinal: Soft and nontender. No distention.  Musculoskeletal: No lower extremity tenderness nor edema. No gross deformities of extremities. Neurologic:  Normal speech and language. No gross focal neurologic deficits are appreciated.  Skin:  Skin is warm, dry and intact. No rash noted.  ____________________________________________  LABS (all labs ordered are listed, but only abnormal results are displayed)  Labs Reviewed  C DIFFICILE QUICK SCREEN W PCR REFLEX   - Abnormal; Notable for the following components:      Result Value   C Diff antigen POSITIVE (*)    C Diff toxin POSITIVE (*)    All other components within normal limits  COMPREHENSIVE METABOLIC PANEL - Abnormal; Notable for the following components:   Chloride 97 (*)    Glucose, Bld 103 (*)    Calcium 8.7 (*)    Total Protein 6.0 (*)    Albumin  2.7 (*)    All other components within normal limits  CBC WITH DIFFERENTIAL/PLATELET - Abnormal; Notable for the following components:   WBC 11.8 (*)    RBC 3.35 (*)    Hemoglobin 10.7 (*)    HCT 31.2 (*)    Neutro Abs 8.7 (*)    Monocytes Absolute 1.2 (*)    Abs Immature Granulocytes 0.12 (*)    All other components within normal limits  CBC - Abnormal; Notable for the following components:   RBC 3.19 (*)    Hemoglobin 9.8 (*)    HCT 29.4 (*)    All other components within normal limits  COMPREHENSIVE METABOLIC PANEL - Abnormal; Notable for the following components:   BUN 5 (*)    Calcium 8.4 (*)    Total Protein 5.1 (*)    Albumin 2.3 (*)    AST 12 (*)    All other components within normal limits  MAGNESIUM - Abnormal; Notable for the following components:   Magnesium 1.6 (*)    All other components within normal limits  BRAIN NATRIURETIC PEPTIDE - Abnormal; Notable for the following components:   B Natriuretic Peptide 108.7 (*)    All other components within normal limits  COMPREHENSIVE METABOLIC PANEL - Abnormal; Notable for the following components:   BUN <5 (*)    Calcium 8.3 (*)    Total Protein 5.2 (*)    Albumin 2.4 (*)    Total Bilirubin 0.2 (*)    All other components within normal limits  CBC WITH DIFFERENTIAL/PLATELET - Abnormal; Notable for the following components:   RBC 3.21 (*)    Hemoglobin 10.0 (*)    HCT 29.5 (*)    All other components within normal limits  BRAIN NATRIURETIC PEPTIDE - Abnormal; Notable for the following components:   B Natriuretic Peptide 171.6 (*)    All other components within normal limits  TSH - Abnormal; Notable for the following components:   TSH 5.040 (*)    All other components within normal limits  GASTROINTESTINAL PANEL BY PCR, STOOL (REPLACES STOOL CULTURE)  LACTIC ACID, PLASMA  LIPASE, BLOOD  MAGNESIUM  CORTISOL    ____________________________________________   PROCEDURES  Procedure(s) performed:    Procedures  None ____________________________________________   INITIAL IMPRESSION / ASSESSMENT AND PLAN / ED COURSE  Pertinent labs & imaging results that were available during my care of the patient were reviewed by me and considered in my medical decision making (see chart for details).   This patient is Presenting for Evaluation of abdominal pain, which does require a range of treatment options, and is a complaint that involves a high risk of morbidity and mortality.  The Differential Diagnoses includes but is not exclusive to acute cholecystitis, intrathoracic causes for epigastric abdominal pain, gastritis, duodenitis, pancreatitis, small bowel or large bowel obstruction, abdominal aortic aneurysm, hernia, gastritis, etc.   Critical Interventions-  Medications  lactated ringers infusion ( Intravenous Transfusing/Transfer 01/11/22 0122)  lactated ringers bolus 1,000 mL (0 mLs Intravenous Stopped 01/09/22 2139)  iohexol (OMNIPAQUE) 350 MG/ML injection 75 mL (75 mLs Intravenous Contrast Given 01/09/22 1548)  lactated ringers bolus 1,000 mL (0 mLs Intravenous Stopped 01/09/22 2139)  potassium chloride SA (KLOR-CON M) CR tablet 20 mEq (20 mEq Oral Given 01/10/22 1027)  magnesium sulfate IVPB 2 g 50 mL (0 g Intravenous Stopped 01/10/22 1459)    Followed by  magnesium sulfate IVPB 2 g 50 mL (0 g Intravenous Stopped 01/10/22 1524)    Reassessment after intervention: Symptoms improved.    I decided to review pertinent External Data, and in summary patient discharged on 12/06/21 after an approximately 1 month specialization where he was treated with daptomycin and ceftriaxone with ultimate discharge with a PICC line which has since been removed.  He was transitioned to Zyvox and that course was completed on 12/08/21.    Clinical Laboratory Tests Ordered, included mild leukocytosis on CBC with mild anemia at 10.7.  Lactic acid is normal.  Lipase normal.  Acute kidney injury.  LFTs and  bilirubin normal.  Radiologic Tests Ordered, included CT abdomen/pelvis. I independently interpreted the images and agree with radiology interpretation.   Cardiac Monitor Tracing which shows NSR.    Social Determinants of Health Risk patient is a smoker.   Medical Decision Making: Summary:  Patient presents emergency department with multiple episodes of diarrhea worsening significantly over the past several days.  His last dose of antibiotic was approximately 1 month ago.  Have lower suspicion for C. difficile but given the recent hospitalization, antibiotics, perfused diarrhea will send antigen as well as stool panel.  Reevaluation with update and discussion with patient. C diff pending. Care transferred to Dr. Billy Fischer.  Disposition: likely admit  ____________________________________________  FINAL CLINICAL IMPRESSION(S) / ED DIAGNOSES  Final diagnoses:  C. difficile colitis  Orthostatic hypotension  Dehydration     NEW OUTPATIENT MEDICATIONS STARTED DURING THIS VISIT:  Discharge Medication List as of 01/12/2022 10:40 AM     START taking these medications   Details  vancomycin (VANCOCIN) 125 MG capsule Take 1 capsule (125 mg total) by mouth 4 (four) times daily., Starting Fri 01/12/2022, Normal        Note:  This document was prepared using Dragon voice recognition software and may include unintentional dictation errors.  Nanda Quinton, MD, [facility] Emergency Medicine    Latiana Tomei, [facility], MD 01/16/22 [phone]

## 2022-01-09 NOTE — H&P (Signed)
History and Physical    Patient: Cameron [name] FOY:[id number] DOB: 1951-01-30 DOA: 01/09/2022 DOS: the patient was seen and examined on 01/09/2022 PCP: Haywood Pao, MD  \ Patient coming from: Home  Chief Complaint:  Chief Complaint  Patient presents with   Diarrhea   Hypotension   HPI: Patient is a 71 years old male with past medical history of COPD, arthritis, chronic abdominal pain and recent admit for discitis requiring multiple antibiotics that was completed 3 weeks ago presented to hospital with abdominal pain that started 2 days back followed by multiple episodes of nonbloody diarrhea.  Patient reported 6-8 episodes per day which were slimy and yellowish in color. He also complained of lightheadedness dizziness.  Denied any nausea or vomiting.  He does have history of chronic back pain, discitis and complains of radiating pain towards the right lower extremity up to the knee.  EMS was called in for dizziness lightheadedness and ongoing diarrheal symptoms.  Patient was noted to have hypotension with blood pressure of 60 /40 and received IV fluid en route to the hospital.  Also stated some subjective fever at home.  Was able to hold food down though.  Denies any urinary urgency, frequency or dysuria.  Denies any syncope or loss of consciousness.  Denies any chest pain, cough, fever, chills or rigors.  In the ED, patient received 2 L of IV fluid.  Initial blood pressure was normal but he was very orthostatic with systolic blood pressure in the 60s on standing.  Patient was afebrile.  Laboratory data showed leukocytosis with WBC at 11.8, hemoglobin of 10.7.  Lactate was within normal limit at 1.0.  Creatinine at 1.1.  Albumin was low at 2.7.  LFTs within normal limits.  Stool test for C. difficile was positive for antigen and toxin.  Due to persistent symptoms and need for volume resuscitation and treatment of C. difficile colitis patient was considered for admission to the  hospital.  Assessment and plan. Principal Problem:   C. difficile diarrhea Active Problems:   Arthritis   BPH (benign prostatic hyperplasia)   COPD (chronic obstructive pulmonary disease) (HCC)   GERD (gastroesophageal reflux disease)   Volume depletion   Orthostatic hypotension   Abdominal pain with diarrhea related to C. difficile diarrhea.  Recent use of antibiotics 3 weeks back for discitis.  Patient has tested positive for C. difficile.  We will continue oral vancomycin at 250 mg 4 times daily.  Continue supportive care.  Continue hydration.  Hold PPI.  IV antiemetics, analgesia as necessary.  Chronic back pain.  On morphine, gabapentin.  We will resume.  Lightheadedness dizziness, volume depletion.  Patient was orthostatic in the ED.  We will continue with orthostatic vitals.  Continue IV fluid hydration.  History of COPD. Compensated.  We will add nebulizer as needed.  History of discitis.   Has completed antibiotics 3 weeks back patient did have a prolonged stay in the hospital for discitis.  He was supposed to follow-up with neurosurgery as outpatient for disc surgery.  Review of Systems: As mentioned in the history of present illness. All other systems reviewed and are negative.  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   Insomnia    Nocturia    Tobacco abuse    Varicocele    Wears glasses    for reading   Past Surgical History:  Procedure Laterality Date    APPENDECTOMY  1965   open   IR LUMBAR [place] W/IMG GUIDE  11/13/2021   MASTECTOMY     1 sx for each breast enalrgement sx was 10 yrs apaprt   ORCHIECTOMY  2005   right   surgery for undescended testicle  age 16   right   THULIUM LASER TURP (TRANSURETHRAL RESECTION OF PROSTATE) N/A 06/10/2020   Procedure: THULIUM LASER TURP (TRANSURETHRAL RESECTION OF PROSTATE);  Surgeon: Festus Aloe, MD;  Location[facility];  Service: Urology;  Laterality: N/A;   varicocoele  1983 or 1984   Social History:  reports that he has been smoking cigarettes. He has a 60.00 pack-year smoking history. His smokeless tobacco use includes chew. He reports that he does not currently use alcohol. He reports that he does not use drugs.  No Known Allergies  Family History  Problem Relation Age of Onset   Prostate cancer Father    Stroke Mother    Prostate cancer Brother    Hypertension Sister    High Cholesterol Sister    Hypertension Sister    Memory loss Sister     Prior to Admission medications   Medication Sig Start Date End Date Taking? Authorizing Provider  diazepam (VALIUM) 2 MG tablet Take 1 tablet (2 mg total) by mouth every 6 (six) hours as needed for muscle spasms. 12/05/21   Angiulli, Lavon Paganini, PA-C  diclofenac (FLECTOR) 1.3 % PTCH Place 1 patch onto the skin 2 (two) times daily. 12/05/21   Angiulli, Lavon Paganini, PA-C  DULoxetine (CYMBALTA) 60 MG capsule Take 1 capsule (60 mg total) by mouth daily. 12/06/21   Angiulli, Lavon Paganini, PA-C  gabapentin (NEURONTIN) 600 MG tablet Take 2 tablets (1,200 mg total) by mouth 3 (three) times daily. 12/05/21   Angiulli, Lavon Paganini, PA-C  HYDROmorphone (DILAUDID) 4 MG tablet Take 1 tablet (4 mg total) by mouth every 4 (four) hours as needed for severe pain (and breakthrough pain). 12/05/21   Angiulli, Lavon Paganini, PA-C  morphine (MS CONTIN) 15 MG 12 hr tablet Take 1 tablet (15 mg total) by mouth every 12 (twelve) hours. 12/05/21   Angiulli, Lavon Paganini,  PA-C  omeprazole (PRILOSEC OTC) 20 MG tablet Take 20 mg by mouth daily.    [provider]  polyethylene glycol (MIRALAX / GLYCOLAX) 17 g packet Take 17 g by mouth 2 (two) times daily. 12/05/21   Angiulli, Lavon Paganini, PA-C  primidone (MYSOLINE) 50 MG tablet TAKE 3 TABLETS BY MOUTH EVERY MORNING, 2 TABLET IN THE AFTERNOON AND 3 IN THE EVENING Patient taking differently: Take 100-150 mg by mouth as directed. TAKE 3 TABLETS BY MOUTH EVERY MORNING, 2 TABLET IN THE AFTERNOON AND 3 IN THE EVENING 08/25/21   Tat, Eustace Quail, DO  rosuvastatin (CRESTOR) 20 MG tablet Take 1 tablet (20 mg total) by mouth daily. 12/05/21   Angiulli, Lavon Paganini, PA-C  tiZANidine (ZANAFLEX) 4 MG tablet Take 1 tablet (4  mg total) by mouth every 6 (six) hours. 12/05/21   Cathlyn Parsons, PA-C    Physical Exam: Vitals:   01/09/22 1330 01/09/22 1415 01/09/22 1715 01/09/22 1727  BP: (!) 123/91 122/61 111/65 115/61  Pulse: 83 71 86 88  Resp: (!) 23 18 (!) 23 13  Temp:    98 F (36.7 C)  SpO2: 93% 98% 97% 95%  Weight:      Height:       General:  Average built, not in obvious distress HENT:   No scleral pallor or icterus noted. Oral mucosa is moist.  Chest:  Clear breath sounds.  Diminished breath sounds bilaterally. No crackles or wheezes.  No tenderness on the back. CVS: S1 &S2 heard. No murmur.  Regular rate and rhythm. Abdomen: Soft, nonspecific tenderness on palpation especially over the umbilical area.  Bowel sounds are heard.   Extremities: No cyanosis, clubbing or edema.  Peripheral pulses are palpable. Psych: Alert, awake and oriented, normal mood CNS:  No cranial nerve deficits.  Power equal in all extremities.   Skin: Warm and dry.  No rashes noted.  Data Reviewed:    Latest Ref Rng & Units 01/09/2022    1:27 PM 12/03/2021    6:37 AM 12/02/2021   10:49 AM  BMP  Glucose 70 - 99 mg/dL 103  108  104   BUN 8 - 23 mg/dL '9  7  10   '$ Creatinine 0.61 - 1.24 mg/dL 1.15  0.93  0.98   Sodium 135 - 145 mmol/L 135   131  131   Potassium 3.5 - 5.1 mmol/L 3.8  4.0  4.2   Chloride 98 - 111 mmol/L 97  94  94   CO2 22 - 32 mmol/L '29  28  28   '$ Calcium 8.9 - 10.3 mg/dL 8.7  8.7  8.9        Latest Ref Rng & Units 01/09/2022    1:27 PM 12/03/2021    6:37 AM 12/02/2021   10:49 AM  CBC  WBC 4.0 - 10.5 K/uL 11.8  5.4  7.8   Hemoglobin 13.0 - 17.0 g/dL 10.7  10.3  11.1   Hematocrit 39.0 - 52.0 % 31.2  30.0  31.5   Platelets 150 - 400 K/uL 210  208  213        Advance Care Planning:   Code Status: Full code  Consults: none  Family Communication: Spoke with the patient's family at bedside.  Severity of Illness: The appropriate patient status for this patient is OBSERVATION. Observation status is judged to be reasonable and necessary in order to provide the required intensity of service to ensure the patient's safety. The patient's presenting symptoms, physical exam findings, and initial radiographic and laboratory data in the context of their medical condition is felt to place them at decreased risk for further clinical deterioration. Furthermore, it is anticipated that the patient will be medically stable for discharge from the hospital within 2 midnights of admission.   Author: Flora Lipps, MD 01/09/2022 6:01 PM  For on call review [email or web address].

## 2022-01-09 NOTE — Hospital Course (Signed)
Patient is a 71 years old male with past medical history of COPD, arthritis, chronic abdominal pain and recent admit for discitis requiring multiple antibiotics that was completed 3 weeks ago presented to hospital with abdominal pain multiple episodes of nonbloody diarrhea.  Patient reported 6-8 episodes per day which were slimy and yellowish in color and for the last 3 to 4 days has been having increasing diarrheal episodes.  He also complained of lightheadedness dizziness.  Denied any nausea or vomiting.  He does have history of chronic back pain.  EMS was called in and patient was noted to have hypotension with blood pressure of 60 /40 and received IV fluid in route to the hospital.  In the ED, patient received 1 L of IV fluid.  Initial blood pressure was normal but he was very orthostatic with systolic blood pressure in the 60s on standing.  Patient was afebrile.  Laboratory data showed leukocytosis with WBC at 11.8, hemoglobin of 10.7.  Lactate was within normal limit at 1.0.  Creatinine at 1.1.  Albumin was low at 2.7.  LFTs within normal limits.  Stool test for C. difficile was positive for antigen and toxin.  Due to persistent symptoms and need for volume resuscitation and treatment of C. difficile colitis patient was considered for admission to the hospital.   Assessment and plan.  Abdominal pain with diarrhea related to C. difficile diarrhea..  Recent use of antibiotics 3 weeks back for discitis.  Patient has tested positive for C. difficile.  We will continue oral vancomycin at 250 mg 4 times daily.  Continue supportive care.  Continue hydration.  Hold PPI.  Chronic pain.  On Dilaudid and morphine, gabapentin.  We will resume.  Lightheadedness dizziness volume depletion.  Patient was orthostatic in the ED.  We will continue with orthostatic vitals.  Continue IV fluid hydration.  History of COPD.  History of discitis.   Has completed antibiotics 3 weeks back.

## 2022-01-09 NOTE — ED Notes (Signed)
Assisted pt to bedside commode.  Pt with a small amount of loose stool.  Sample sent to lab.

## 2022-01-09 NOTE — ED Triage Notes (Signed)
Pt BIB by EMS for complaints of diarrhea and hypotension.  Pt reports multiple amounts of diarrhea x 2 days.  Reports feeling dizzy and lightheaded this morning.  Recent antibiotic use for back problems.  Pt recceived 1L LR with EMS.  18G RAC placed by EMS. Initial BP upon EMS arrival 60s/40s.

## 2022-01-10 DIAGNOSIS — G8929 Other chronic pain: Secondary | ICD-10-CM | POA: Diagnosis present

## 2022-01-10 DIAGNOSIS — A0472 Enterocolitis due to Clostridium difficile, not specified as recurrent: Secondary | ICD-10-CM | POA: Diagnosis present

## 2022-01-10 DIAGNOSIS — Z79899 Other long term (current) drug therapy: Secondary | ICD-10-CM | POA: Diagnosis not present

## 2022-01-10 DIAGNOSIS — I951 Orthostatic hypotension: Secondary | ICD-10-CM | POA: Diagnosis present

## 2022-01-10 DIAGNOSIS — G4700 Insomnia, unspecified: Secondary | ICD-10-CM | POA: Diagnosis present

## 2022-01-10 DIAGNOSIS — F17210 Nicotine dependence, cigarettes, uncomplicated: Secondary | ICD-10-CM | POA: Diagnosis present

## 2022-01-10 DIAGNOSIS — K219 Gastro-esophageal reflux disease without esophagitis: Secondary | ICD-10-CM | POA: Diagnosis present

## 2022-01-10 DIAGNOSIS — Z8616 Personal history of COVID-19: Secondary | ICD-10-CM | POA: Diagnosis not present

## 2022-01-10 DIAGNOSIS — M1990 Unspecified osteoarthritis, unspecified site: Secondary | ICD-10-CM | POA: Diagnosis present

## 2022-01-10 DIAGNOSIS — Z8547 Personal history of malignant neoplasm of testis: Secondary | ICD-10-CM | POA: Diagnosis not present

## 2022-01-10 DIAGNOSIS — M549 Dorsalgia, unspecified: Secondary | ICD-10-CM | POA: Diagnosis present

## 2022-01-10 DIAGNOSIS — Z792 Long term (current) use of antibiotics: Secondary | ICD-10-CM | POA: Diagnosis not present

## 2022-01-10 DIAGNOSIS — J449 Chronic obstructive pulmonary disease, unspecified: Secondary | ICD-10-CM | POA: Diagnosis present

## 2022-01-10 DIAGNOSIS — N400 Enlarged prostate without lower urinary tract symptoms: Secondary | ICD-10-CM | POA: Diagnosis present

## 2022-01-10 DIAGNOSIS — Z9049 Acquired absence of other specified parts of digestive tract: Secondary | ICD-10-CM | POA: Diagnosis not present

## 2022-01-10 DIAGNOSIS — E860 Dehydration: Secondary | ICD-10-CM | POA: Diagnosis present

## 2022-01-10 LAB — GASTROINTESTINAL PANEL BY PCR, STOOL (REPLACES STOOL CULTURE)

## 2022-01-10 LAB — COMPREHENSIVE METABOLIC PANEL
ALT: 16 U/L (ref 0–44)
AST: 12 U/L — ABNORMAL LOW (ref 15–41)
Albumin: 2.3 g/dL — ABNORMAL LOW (ref 3.5–5.0)
Alkaline Phosphatase: 42 U/L (ref 38–126)
Anion gap: 6 (ref 5–15)
BUN: 5 mg/dL — ABNORMAL LOW (ref 8–23)
CO2: 27 mmol/L (ref 22–32)
Calcium: 8.4 mg/dL — ABNORMAL LOW (ref 8.9–10.3)
Chloride: 103 mmol/L (ref 98–111)
Creatinine, Ser: 0.85 mg/dL (ref 0.61–1.24)
GFR, Estimated: >60 mL/min (ref >60)
Glucose, Bld: 96 mg/dL (ref 70–99)
Potassium: 3.8 mmol/L (ref 3.5–5.1)
Sodium: 136 mmol/L (ref 135–145)
Total Bilirubin: 0.6 mg/dL (ref 0.3–1.2)
Total Protein: 5.1 g/dL — ABNORMAL LOW (ref 6.5–8.1)

## 2022-01-10 LAB — MAGNESIUM: Magnesium: 1.6 mg/dL — ABNORMAL LOW (ref 1.7–2.4)

## 2022-01-10 LAB — CBC
HCT: 29.4 % — ABNORMAL LOW (ref 39.0–52.0)
Hemoglobin: 9.8 g/dL — ABNORMAL LOW (ref 13.0–17.0)
MCH: 30.7 pg (ref 26.0–34.0)
MCHC: 33.3 g/dL (ref 30.0–36.0)
MCV: 92.2 fL (ref 80.0–100.0)
Platelets: 211 10*3/uL (ref 150–400)
RBC: 3.19 MIL/uL — ABNORMAL LOW (ref 4.22–5.81)
RDW: 13 % (ref 11.5–15.5)
WBC: 8.4 10*3/uL (ref 4.0–10.5)
nRBC: 0 % (ref 0.0–0.2)

## 2022-01-10 LAB — BRAIN NATRIURETIC PEPTIDE: B Natriuretic Peptide: 108.7 pg/mL — ABNORMAL HIGH (ref 0.0–100.0)

## 2022-01-10 MED ORDER — MAGNESIUM SULFATE 2 GM/50ML IV SOLN
2.0000 g | Freq: Once | INTRAVENOUS | Status: AC
  Administered 2022-01-10: 2 g via INTRAVENOUS
  Filled 2022-01-10: qty 50

## 2022-01-10 MED ORDER — POTASSIUM CHLORIDE CRYS ER 20 MEQ PO TBCR
20.0000 meq | EXTENDED_RELEASE_TABLET | Freq: Once | ORAL | Status: AC
  Administered 2022-01-10: 20 meq via ORAL
  Filled 2022-01-10: qty 1

## 2022-01-10 MED ORDER — MAGNESIUM SULFATE 4 GM/100ML IV SOLN
4.0000 g | Freq: Once | INTRAVENOUS | Status: DC
  Filled 2022-01-10: qty 100

## 2022-01-10 MED ORDER — LACTATED RINGERS IV SOLN
INTRAVENOUS | Status: AC
  Administered 2022-01-10: 1000 mL via INTRAVENOUS

## 2022-01-10 MED ORDER — LACTATED RINGERS IV SOLN: INTRAVENOUS | Status: DC

## 2022-01-10 MED ORDER — TIZANIDINE HCL 4 MG PO TABS
4.0000 mg | ORAL_TABLET | Freq: Three times a day (TID) | ORAL | Status: DC
  Administered 2022-01-10 – 2022-01-12 (×7): 4 mg via ORAL
  Filled 2022-01-10 (×7): qty 1

## 2022-01-10 NOTE — Progress Notes (Addendum)
PROGRESS NOTE                                                                                                                                                                                                             Patient Demographics:    Cameron [name], is a 71 y.o. male, DOB - 1950-09-06, QQP:[id number]  Outpatient Primary MD for the patient is Tisovec, Fransico Him, MD    LOS - 0  Admit date - 01/09/2022    Chief Complaint  Patient presents with   Diarrhea   Hypotension       Brief Narrative (HPI from H&P)   71 years old male with past medical history of COPD, arthritis, chronic abdominal pain and recent admit for discitis requiring multiple antibiotics that was completed 3 weeks ago presented to hospital with abdominal pain that started 2 days back followed by multiple episodes of nonbloody diarrhea, following this he became weak and dizzy, was brought to the ER where he was diagnosed with C. difficile colitis and dehydration and admitted to the hospital.   Subjective:    Mateo Flow today has, No headache, No chest pain, No abdominal pain - No Nausea, No new weakness tingling or numbness,, some generalized weakness.   Assessment  & Plan :   C. difficile colitis causing severe diarrhea, dehydration, hypomagnesemia and orthostatic hypotension. he was recently on prolonged antibiotic course for discitis, he has finished his antibiotic 3 weeks ago, he has been placed on oral vancomycin along with IV fluids and IV magnesium, overall feeling better but still feels generally weak, continue IV fluids, PT OT.  Advance activity.  If stable discharge tomorrow.  Chronic back pain.  On combination of long-acting morphine and Neurontin.  History of COPD.  Compensated no acute issues.  History of discitis.  Finished antibiotic course 3 weeks ago.  Post discharge follow-up with PCP and neurosurgeon.       Condition -  Fair  Family Communication  : None present  Code Status :  Full  Consults  :  None  PUD Prophylaxis :     Procedures  :            Disposition Plan  :    Status is: Observation  DVT Prophylaxis  :    enoxaparin (LOVENOX) injection 40 mg Start: 01/09/22 2000  Lab Results  Component Value Date   PLT 211 01/10/2022    Diet :  Diet Order             DIET SOFT Room service appropriate? Yes; Fluid consistency: Thin  Diet effective now                    Inpatient Medications  Scheduled Meds:  DULoxetine  60 mg Oral Daily   enoxaparin (LOVENOX) injection  40 mg Subcutaneous Q24H   gabapentin  600 mg Oral TID   morphine  15 mg Oral Q12H   rosuvastatin  20 mg Oral Daily   tiZANidine  4 mg Oral TID   vancomycin  125 mg Oral QID   Continuous Infusions:  lactated ringers     magnesium sulfate bolus IVPB     PRN Meds:.acetaminophen **OR** acetaminophen, albuterol, morphine injection, ondansetron **OR** ondansetron (ZOFRAN) IV    Objective:   Vitals:   01/10/22 0200 01/10/22 0400 01/10/22 0500 01/10/22 0627  BP: (!) 130/58 (!) 124/58 136/64   Pulse: 76 70 67   Resp: '14 13 13   '$ Temp:    98.1 F (36.7 C)  TempSrc:    Oral  SpO2: (!) 89% 91% 93%   Weight:      Height:        Wt Readings from Last 3 Encounters:  01/09/22 69.4 kg  11/18/21 72.6 kg  11/11/21 75.8 kg    No intake or output data in the 24 hours ending 01/10/22 0855   Physical Exam  Awake Alert, No new F.N deficits, Normal affect [place].AT,PERRAL Supple Neck, No JVD,   Symmetrical Chest wall movement, Good air movement bilaterally, CTAB RRR,No Gallops,Rubs or new Murmurs,  +ve B.Sounds, Abd Soft, No tenderness,   No Cyanosis, Clubbing or edema        Data Review:    CBC Recent Labs  Lab 01/09/22 1327 01/10/22 0600  WBC 11.8* 8.4  HGB 10.7* 9.8*  HCT 31.2* 29.4*  PLT 210 211  MCV 93.1 92.2  MCH 31.9 30.7  MCHC 34.3 33.3  RDW 13.3 13.0  LYMPHSABS 1.7  --   MONOABS  1.2*  --   EOSABS 0.2  --   BASOSABS 0.1  --     Electrolytes Recent Labs  Lab 01/09/22 1327 01/10/22 0600  NA 135 136  K 3.8 3.8  CL 97* 103  CO2 29 27  GLUCOSE 103* 96  BUN 9 5*  CREATININE 1.15 0.85  CALCIUM 8.7* 8.4*  AST 16 12*  ALT 17 16  ALKPHOS 47 42  BILITOT 0.3 0.6  ALBUMIN 2.7* 2.3*  MG  --  1.6*  LATICACIDVEN 1.0  --   BNP  --  108.7*    ------------------------------------------------------------------------------------------------------------------ No results for input(s): "CHOL", "HDL", "LDLCALC", "TRIG", "CHOLHDL", "LDLDIRECT" in the last 72 hours.  No results found for: "HGBA1C"  No results for input(s): "TSH", "T4TOTAL", "T3FREE", "THYROIDAB" in the last 72 hours.  Invalid input(s): "FREET3" ------------------------------------------------------------------------------------------------------------------ ID Labs Recent Labs  Lab 01/09/22 1327 01/10/22 0600  WBC 11.8* 8.4  PLT 210 211  LATICACIDVEN 1.0  --   CREATININE 1.15 0.85   Cardiac Enzymes No results for input(s): "CKMB", "TROPONINI", "MYOGLOBIN" in the last 168 hours.  Invalid input(s): "CK"       Micro Results Recent Results (from the past 240 hour(s))  C Difficile Quick Screen w PCR reflex     Status: Abnormal   Collection Time: 01/09/22  1:13 PM  Specimen: STOOL  Result Value Ref Range Status   C Diff antigen POSITIVE (A) NEGATIVE Final   C Diff toxin POSITIVE (A) NEGATIVE Final   C Diff interpretation Toxin producing C. difficile detected.  Final    Comment: CRITICAL RESULT CALLED TO, READ BACK BY AND VERIFIED WITH: RN MEGAN TRIPHAMMER ON 01/09/22 @ 1655 BY DRT Performed at [facility], [place] [address], [place], [place] [address]     Radiology Reports CT ABDOMEN PELVIS W CONTRAST  Result Date: 01/09/2022 CLINICAL DATA:  Abdominal pain, diarrhea, dizziness EXAM: CT ABDOMEN AND PELVIS WITH CONTRAST TECHNIQUE: Multidetector CT imaging of the abdomen and pelvis  was performed using the standard protocol following bolus administration of intravenous contrast. RADIATION DOSE REDUCTION: This exam was performed according to the departmental dose-optimization program which includes automated exposure control, adjustment of the mA and/or kV according to patient size and/or use of iterative reconstruction technique. CONTRAST:  52m OMNIPAQUE IOHEXOL 350 MG/ML SOLN COMPARISON:  03/25/2008 FINDINGS: Lower chest: There is no focal infiltrate in the lower lung fields. There is no pleural effusion. Hepatobiliary: Liver measures 19.9 cm in length. No focal abnormalities are seen. There is no dilation of bile ducts. There is high density in the lumen of fundus of the gallbladder. There is no significant wall thickening in gallbladder. Pancreas: No focal abnormalities are seen. Spleen: Unremarkable. Adrenals/Urinary Tract: Adrenals are unremarkable. There is no hydronephrosis. There are no renal or ureteral stones. Urinary bladder is unremarkable. There is mild wall thickening in the fundus and right lateral margin of the urinary bladder. Stomach/Bowel: Stomach is not distended. Small bowel loops are not dilated. Appendix is not seen. There is diffuse wall thickening and mucosal enhancement in the colon, more so in the right colon. There is no loculated pericolic fluid collection. Vascular/Lymphatic: Scattered arterial calcifications and atherosclerotic plaques are seen. Reproductive: Unremarkable. Other: There is no ascites or pneumoperitoneum. Umbilical hernia containing fat is seen. Left inguinal hernia containing fat is seen. Musculoskeletal: Degenerative changes are noted in lumbar spine, particularly severe at L2-L3 and L4-L5 levels with spinal stenosis and encroachment of neural foramina. IMPRESSION: There is a diffuse wall thickening in colon, more so in the ascending and transverse colon suggesting acute inflammatory or infectious colitis. There is no evidence of intestinal  obstruction or pneumoperitoneum. There is no hydronephrosis. Gallbladder stones. Enlarged liver. Lumbar spondylosis. Arteriosclerosis. Other findings as described in the body of the report. Electronically Signed   By: PElmer PickerM.D.   On: 01/09/2022 15:59      Signature  PLala LundM.D on 01/10/2022 at 8:55 AM   -  To page go to [email or web address]

## 2022-01-10 NOTE — ED Provider Notes (Signed)
  Physical Exam  BP (!) 130/59   Pulse 79   Temp 98 F (36.7 C) (Oral)   Resp 17   Ht '5\' 9"'$  (1.753 m)   Wt 69.4 kg   SpO2 100%   BMI 22.59 kg/m   Physical Exam  Procedures  Procedures  ED Course / MDM    Medical Decision Making Amount and/or Complexity of Data Reviewed Labs: ordered. Radiology: ordered.  Risk Prescription drug management. Decision regarding hospitalization.   Received care of pt from Dr. Laverta Baltimore. pLease see his note for prior hx,physical anc care.   Briefly, this is a 71 year old male who had a recent hospital admission for suspected discitis and was given empiric antibotics who presents with concern for diarrhea.  CT shows colitis.  Labs are reassuring.  C. difficile is pending.  His initial blood pressure was in the 60s for EMS, he has received fluids in the emergency department and has been hemodynamically stable.  C. difficile testing returned and positive.  Orthostatic blood pressure was checked, and he was found to have a blood pressure down to the 60s with standing despite IV fluids.  He has had continued diarrhea while in the emergency department.  Given continued significant orthostatic blood pressure changes, continuing diarrhea, C. difficile colitis, will admit for continued hydration and care.  Ordered vancomycin and Flagyl per severe C. difficile protocol given his episodes of hypotension.       Gareth Morgan, MD 01/10/22 1311

## 2022-01-11 ENCOUNTER — Other Ambulatory Visit: Payer: Self-pay

## 2022-01-11 DIAGNOSIS — A0472 Enterocolitis due to Clostridium difficile, not specified as recurrent: Secondary | ICD-10-CM | POA: Diagnosis not present

## 2022-01-11 LAB — COMPREHENSIVE METABOLIC PANEL
ALT: 14 U/L (ref 0–44)
AST: 15 U/L (ref 15–41)
Albumin: 2.4 g/dL — ABNORMAL LOW (ref 3.5–5.0)
Alkaline Phosphatase: 42 U/L (ref 38–126)
Anion gap: 8 (ref 5–15)
BUN: <5 mg/dL — ABNORMAL LOW (ref 8–23)
CO2: 29 mmol/L (ref 22–32)
Calcium: 8.3 mg/dL — ABNORMAL LOW (ref 8.9–10.3)
Chloride: 102 mmol/L (ref 98–111)
Creatinine, Ser: 0.98 mg/dL (ref 0.61–1.24)
GFR, Estimated: >60 mL/min (ref >60)
Glucose, Bld: 96 mg/dL (ref 70–99)
Potassium: 3.7 mmol/L (ref 3.5–5.1)
Sodium: 139 mmol/L (ref 135–145)
Total Bilirubin: 0.2 mg/dL — ABNORMAL LOW (ref 0.3–1.2)
Total Protein: 5.2 g/dL — ABNORMAL LOW (ref 6.5–8.1)

## 2022-01-11 LAB — CBC WITH DIFFERENTIAL/PLATELET
Abs Immature Granulocytes: 0.03 10*3/uL (ref 0.00–0.07)
Basophils Absolute: 0 10*3/uL (ref 0.0–0.1)
Basophils Relative: 1 %
Eosinophils Absolute: 0.2 10*3/uL (ref 0.0–0.5)
Eosinophils Relative: 3 %
HCT: 29.5 % — ABNORMAL LOW (ref 39.0–52.0)
Hemoglobin: 10 g/dL — ABNORMAL LOW (ref 13.0–17.0)
Immature Granulocytes: 0 %
Lymphocytes Relative: 23 %
Lymphs Abs: 1.7 10*3/uL (ref 0.7–4.0)
MCH: 31.2 pg (ref 26.0–34.0)
MCHC: 33.9 g/dL (ref 30.0–36.0)
MCV: 91.9 fL (ref 80.0–100.0)
Monocytes Absolute: 0.7 10*3/uL (ref 0.1–1.0)
Monocytes Relative: 9 %
Neutro Abs: 4.8 10*3/uL (ref 1.7–7.7)
Neutrophils Relative %: 64 %
Platelets: 211 10*3/uL (ref 150–400)
RBC: 3.21 MIL/uL — ABNORMAL LOW (ref 4.22–5.81)
RDW: 12.9 % (ref 11.5–15.5)
WBC: 7.4 10*3/uL (ref 4.0–10.5)
nRBC: 0 % (ref 0.0–0.2)

## 2022-01-11 LAB — MAGNESIUM: Magnesium: 2.1 mg/dL (ref 1.7–2.4)

## 2022-01-11 LAB — BRAIN NATRIURETIC PEPTIDE: B Natriuretic Peptide: 171.6 pg/mL — ABNORMAL HIGH (ref 0.0–100.0)

## 2022-01-11 MED ORDER — LACTATED RINGERS IV SOLN: INTRAVENOUS | Status: DC

## 2022-01-11 NOTE — Evaluation (Addendum)
Physical Therapy Evaluation Patient Details Name: Cameron [name] MRN: [id number] DOB: August 11, 1950 Today's Date: 01/11/2022  History of Present Illness  Pt is 71 year old presented to [facility] on  01/09/22 with diarrhea and hypotension. Pt +C diff. PMH - L2-3 discitis 11/2021 with AIR stay, testicular CA, copd, arthritis  Clinical Impression  Pt presents to PT with slightly decr mobility due to illness, orthostasis and inactivity. Expect pt will make good progress back to baseline with mobility. Will follow acutely but doubt pt will need PT after DC.   Orthostatic BPs/HR  Supine 114/56 69  Sitting 96/64 73  Standing 88/57 77  Standing after 3 min 75/56 87  Sitting after ambulation 97/54 70   Pt denied lightheadedness or dizziness throughout session.          Recommendations for follow up therapy are one component of a multi-disciplinary discharge planning process, led by the attending physician.  Recommendations may be updated based on patient status, additional functional criteria and insurance authorization.  Follow Up Recommendations No PT follow up      Assistance Recommended at Discharge Intermittent Supervision/Assistance  Patient can return home with the following  Help with stairs or ramp for entrance;Assist for transportation    Equipment Recommendations None recommended by PT  Recommendations for Other Services       Functional Status Assessment Patient has had a recent decline in their functional status and demonstrates the ability to make significant improvements in function in a reasonable and predictable amount of time.     Precautions / Restrictions Precautions Precautions: Fall;Other (comment) Precaution Comments: orthostatic,cdiff      Mobility  Bed Mobility Overal bed mobility: Modified Independent                  Transfers Overall transfer level: Needs assistance Equipment used: Rolling walker (2 wheels) Transfers: Sit to/from Stand Sit to  Stand: Supervision           General transfer comment: supervision due to soft BP    Ambulation/Gait Ambulation/Gait assistance: Supervision Gait Distance (Feet): 150 Feet Assistive device: Rolling walker (2 wheels) Gait Pattern/deviations: Step-through pattern, Decreased stride length Gait velocity: decr Gait velocity interpretation: 1.31 - 2.62 ft/sec, indicative of limited community ambulator   General Gait Details: supervision for safety and lines due to soft BP  Stairs            Wheelchair Mobility    Modified Rankin (Stroke Patients Only)       Balance Overall balance assessment: Needs assistance Sitting-balance support: No upper extremity supported, Feet supported Sitting balance-Leahy Scale: Normal     Standing balance support: No upper extremity supported, During functional activity Standing balance-Leahy Scale: Fair                               Pertinent Vitals/Pain Pain Assessment Pain Assessment: 0-10 Pain Score: 4  Pain Location: lt knee Pain Descriptors / Indicators: Sore Pain Intervention(s): Limited activity within patient's tolerance, Monitored during session    Home Living Family/patient expects to be discharged to:: Private residence Living Arrangements: Spouse/significant other Available Help at Discharge: Family;Available 24 hours/day Type of Home: House Home Access: Stairs to enter   [employer] of Steps: in front 3 steps with rail on R; in back 5 steps with rails on both sides -can reach both but would have longer to walk Alternate Level Stairs-Number of Steps: flight of stairs Home Layout: Two  level;Able to live on main level with bedroom/bathroom Home Equipment: Rolling Walker (2 wheels);Grab bars - tub/shower      Prior Function Prior Level of Function : Independent/Modified Independent             Mobility Comments: Has been using walker since hospitalization for discitis. Prior to that pt very  active       Hand Dominance        Extremity/Trunk Assessment   Upper Extremity Assessment Upper Extremity Assessment: Overall WFL for tasks assessed    Lower Extremity Assessment Lower Extremity Assessment: Generalized weakness       Communication   Communication: No difficulties  Cognition Arousal/Alertness: Awake/alert Behavior During Therapy: WFL for tasks assessed/performed Overall Cognitive Status: Within Functional Limits for tasks assessed                                          General Comments      Exercises     Assessment/Plan    PT Assessment Patient needs continued PT services  PT Problem List Decreased strength;Decreased balance;Decreased mobility       PT Treatment Interventions DME instruction;Gait training;Functional mobility training;Therapeutic activities;Therapeutic exercise;Balance training;Patient/family education    PT Goals (Current goals can be found in the Care Plan section)  Acute Rehab PT Goals Patient Stated Goal: return home PT Goal Formulation: With patient Time For Goal Achievement: 01/18/22 Potential to Achieve Goals: Good    Frequency Min 3X/week     Co-evaluation               AM-PAC PT "6 Clicks" Mobility  Outcome Measure Help needed turning from your back to your side while in a flat bed without using bedrails?: None Help needed moving from lying on your back to sitting on the side of a flat bed without using bedrails?: None Help needed moving to and from a bed to a chair (including a wheelchair)?: A Little Help needed standing up from a chair using your arms (e.g., wheelchair or bedside chair)?: A Little Help needed to walk in hospital room?: A Little Help needed climbing 3-5 steps with a railing? : A Little 6 Click Score: 20    End of Session   Activity Tolerance: Patient tolerated treatment well Patient left: in chair;with call bell/phone within reach;with chair alarm set   PT Visit  Diagnosis: Other abnormalities of gait and mobility (R26.89);Muscle weakness (generalized) (M62.81)    Time: 7342-8768 PT Time Calculation (min) (ACUTE ONLY): 20 min   Charges:   PT Evaluation $PT Eval Low Complexity: [place] [facility] 01/11/2022, 1:54 PM

## 2022-01-11 NOTE — Progress Notes (Signed)
PROGRESS NOTE                                                                                                                                                                                                             Patient Demographics:    Cameron [name], is a 71 y.o. male, DOB - 1950/05/24, WYO:[id number]  Outpatient Primary MD for the patient is Tisovec, Fransico Him, MD    LOS - 1  Admit date - 01/09/2022    Chief Complaint  Patient presents with   Diarrhea   Hypotension       Brief Narrative (HPI from H&P)   71 years old male with past medical history of COPD, arthritis, chronic abdominal pain and recent admit for discitis requiring multiple antibiotics that was completed 3 weeks ago presented to hospital with abdominal pain that started 2 days back followed by multiple episodes of nonbloody diarrhea, following this he became weak and dizzy, was brought to the ER where he was diagnosed with C. difficile colitis and dehydration and admitted to the hospital.   Subjective:   Patient in bed, appears comfortable, denies any headache, no fever, no chest pain or pressure, no shortness of breath , no abdominal pain. No new focal weakness.   Assessment  & Plan :   C. difficile colitis present on admission causing severe diarrhea, dehydration, hypomagnesemia and orthostatic hypotension. he was recently on prolonged antibiotic course for discitis, he has finished his antibiotic 3 weeks ago, he has been placed on oral vancomycin along with IV fluids and IV magnesium, overall feeling better but still feels generally weak, monitor orthostatics, continue IV fluids, PT OT.  Advance activity.  If stable discharge on 01/12/2022.  Chronic back pain.  On combination of long-acting morphine and Neurontin.  History of COPD.  Compensated no acute issues.  History of discitis.  Finished antibiotic course 3 weeks ago.  Post discharge  follow-up with PCP and neurosurgeon.       Condition - Fair  Family Communication  : None present  Code Status :  Full  Consults  :  None  PUD Prophylaxis :     Procedures  :            Disposition Plan  :    Status is: Observation  DVT Prophylaxis  :    enoxaparin (  LOVENOX) injection 40 mg Start: 01/09/22 2000  Lab Results  Component Value Date   PLT 211 01/11/2022    Diet :  Diet Order             Diet Heart Room service appropriate? Yes; Fluid consistency: Thin  Diet effective now                    Inpatient Medications  Scheduled Meds:  DULoxetine  60 mg Oral Daily   enoxaparin (LOVENOX) injection  40 mg Subcutaneous Q24H   gabapentin  600 mg Oral TID   morphine  15 mg Oral Q12H   rosuvastatin  20 mg Oral Daily   tiZANidine  4 mg Oral TID   vancomycin  125 mg Oral QID   Continuous Infusions:  lactated ringers     PRN Meds:.acetaminophen **OR** acetaminophen, albuterol, morphine injection, ondansetron **OR** ondansetron (ZOFRAN) IV    Objective:   Vitals:   01/11/22 0218 01/11/22 0308 01/11/22 0500 01/11/22 0745  BP: (!) 140/61 136/62    Pulse: 72 73    Resp: 16 15    Temp: 98.1 F (36.7 C) 98 F (36.7 C)  98.2 F (36.8 C)  TempSrc: Oral Oral  Oral  SpO2: 97% 91%    Weight:   75.7 kg   Height:        Wt Readings from Last 3 Encounters:  01/11/22 75.7 kg  11/18/21 72.6 kg  11/11/21 75.8 kg     Intake/Output Summary (Last 24 hours) at 01/11/2022 1039 Last data filed at 01/11/2022 0747 Gross per 24 hour  Intake --  Output 400 ml  Net -400 ml     Physical Exam  Awake Alert, No new F.N deficits, Normal affect [place].AT,PERRAL Supple Neck, No JVD,   Symmetrical Chest wall movement, Good air movement bilaterally, CTAB RRR,No Gallops,Rubs or new Murmurs,  +ve B.Sounds, Abd Soft, No tenderness,   No Cyanosis, Clubbing or edema        Data Review:    CBC Recent Labs  Lab 01/09/22 1327 01/10/22 0600 01/11/22 0528   WBC 11.8* 8.4 7.4  HGB 10.7* 9.8* 10.0*  HCT 31.2* 29.4* 29.5*  PLT 210 211 211  MCV 93.1 92.2 91.9  MCH 31.9 30.7 31.2  MCHC 34.3 33.3 33.9  RDW 13.3 13.0 12.9  LYMPHSABS 1.7  --  1.7  MONOABS 1.2*  --  0.7  EOSABS 0.2  --  0.2  BASOSABS 0.1  --  0.0    Electrolytes Recent Labs  Lab 01/09/22 1327 01/10/22 0600 01/11/22 0528  NA 135 136 139  K 3.8 3.8 3.7  CL 97* 103 102  CO2 '29 27 29  '$ GLUCOSE 103* 96 96  BUN 9 5* <5*  CREATININE 1.15 0.85 0.98  CALCIUM 8.7* 8.4* 8.3*  AST 16 12* 15  ALT '17 16 14  '$ ALKPHOS 47 42 42  BILITOT 0.3 0.6 0.2*  ALBUMIN 2.7* 2.3* 2.4*  MG  --  1.6* 2.1  LATICACIDVEN 1.0  --   --   BNP  --  108.7* 171.6*     Radiology Reports CT ABDOMEN PELVIS W CONTRAST  Result Date: 01/09/2022 CLINICAL DATA:  Abdominal pain, diarrhea, dizziness EXAM: CT ABDOMEN AND PELVIS WITH CONTRAST TECHNIQUE: Multidetector CT imaging of the abdomen and pelvis was performed using the standard protocol following bolus administration of intravenous contrast. RADIATION DOSE REDUCTION: This exam was performed according to the departmental dose-optimization program which includes automated exposure control, adjustment of  the mA and/or kV according to patient size and/or use of iterative reconstruction technique. CONTRAST:  89m OMNIPAQUE IOHEXOL 350 MG/ML SOLN COMPARISON:  03/25/2008 FINDINGS: Lower chest: There is no focal infiltrate in the lower lung fields. There is no pleural effusion. Hepatobiliary: Liver measures 19.9 cm in length. No focal abnormalities are seen. There is no dilation of bile ducts. There is high density in the lumen of fundus of the gallbladder. There is no significant wall thickening in gallbladder. Pancreas: No focal abnormalities are seen. Spleen: Unremarkable. Adrenals/Urinary Tract: Adrenals are unremarkable. There is no hydronephrosis. There are no renal or ureteral stones. Urinary bladder is unremarkable. There is mild wall thickening in the fundus and  right lateral margin of the urinary bladder. Stomach/Bowel: Stomach is not distended. Small bowel loops are not dilated. Appendix is not seen. There is diffuse wall thickening and mucosal enhancement in the colon, more so in the right colon. There is no loculated pericolic fluid collection. Vascular/Lymphatic: Scattered arterial calcifications and atherosclerotic plaques are seen. Reproductive: Unremarkable. Other: There is no ascites or pneumoperitoneum. Umbilical hernia containing fat is seen. Left inguinal hernia containing fat is seen. Musculoskeletal: Degenerative changes are noted in lumbar spine, particularly severe at L2-L3 and L4-L5 levels with spinal stenosis and encroachment of neural foramina. IMPRESSION: There is a diffuse wall thickening in colon, more so in the ascending and transverse colon suggesting acute inflammatory or infectious colitis. There is no evidence of intestinal obstruction or pneumoperitoneum. There is no hydronephrosis. Gallbladder stones. Enlarged liver. Lumbar spondylosis. Arteriosclerosis. Other findings as described in the body of the report. Electronically Signed   By: PElmer PickerM.D.   On: 01/09/2022 15:59      Signature  PLala LundM.D on 01/11/2022 at 10:39 AM   -  To page go to [email or web address]

## 2022-01-11 NOTE — Progress Notes (Signed)
  Transition of Care [facility]) Screening Note   Patient Details  Name: Cameron [name] Date of Birth: 12-10-1950   Transition of [facility]) CM/SW Contact:    Cyndi Bender, RN Phone Number: 01/11/2022, 9:05 AM    Transition of Care Department [facility]) has reviewed patient and no TOC needs have been identified at this time. We will continue to monitor patient advancement through interdisciplinary progression rounds. If new patient transition needs arise, please place a TOC consult.

## 2022-01-12 ENCOUNTER — Other Ambulatory Visit (HOSPITAL_COMMUNITY): Payer: Self-pay

## 2022-01-12 ENCOUNTER — Inpatient Hospital Stay (HOSPITAL_COMMUNITY): Payer: Medicare Other

## 2022-01-12 DIAGNOSIS — A0472 Enterocolitis due to Clostridium difficile, not specified as recurrent: Secondary | ICD-10-CM | POA: Diagnosis not present

## 2022-01-12 LAB — TSH: TSH: 5.04 u[IU]/mL — ABNORMAL HIGH (ref 0.350–4.500)

## 2022-01-12 LAB — CORTISOL: Cortisol, Plasma: 5.2 ug/dL

## 2022-01-12 MED ORDER — VANCOMYCIN HCL 125 MG PO CAPS
125.0000 mg | ORAL_CAPSULE | Freq: Four times a day (QID) | ORAL | 0 refills | Status: DC
  Filled 2022-01-12: qty 40, 10d supply, fill #0
  Filled 2022-01-12: qty 16, 4d supply, fill #1

## 2022-01-12 NOTE — Plan of Care (Signed)

## 2022-01-12 NOTE — Discharge Summary (Signed)
CRISTOFHER LIVECCHI DHR:[id number] DOB: 09-Jun-1950 DOA: 01/09/2022  PCP: Haywood Pao, MD  Admit date: 01/09/2022  Discharge date: 01/12/2022  Admitted From: Home   Disposition:  Home   Recommendations for Outpatient Follow-up:   Follow up with PCP in 1-2 weeks  PCP Please obtain BMP/CBC, 2 view CXR in 1week,  (see Discharge instructions)   PCP Please follow up on the following pending results:    Home Health: None   Equipment/Devices: Has a walker and BSC  Consultations: None  Discharge Condition: Stable    CODE STATUS: Full    Diet Recommendation: Heart Healthy     Chief Complaint  Patient presents with   Diarrhea   Hypotension     Brief history of present illness from the day of admission and additional interim summary    72 years old male with past medical history of COPD, arthritis, chronic abdominal pain and recent admit for discitis requiring multiple antibiotics that was completed 3 weeks ago presented to hospital with abdominal pain that started 2 days back followed by multiple episodes of nonbloody diarrhea, following this he became weak and dizzy, was brought to the ER where he was diagnosed with C. difficile colitis and dehydration and admitted to the hospital.                                                                  Hospital Course    C. difficile colitis present on admission causing severe diarrhea, dehydration, hypomagnesemia and orthostatic hypotension. he was recently on prolonged antibiotic course for discitis, he has finished his antibiotic 3 weeks ago, he has been placed on oral vancomycin along with IV fluids and IV magnesium, overall feeling better, walked with PT yesterday without any problems, blood pressure stable now, had some orthostasis for which TED stockings have been  added overall he is symptom-free walking with PT without any discomfort.  Will be discharged home on 2 of oral vancomycin with close outpatient PCP follow-up.   Chronic back pain.  On combination of long-acting morphine and Neurontin.  He is on large doses of narcotics at home along with muscle relaxers could be causing decrease in his balance and some orthostatic hypotension at times.  Rested to cut down on narcotics and muscle relaxants as much as possible.   History of COPD.  Compensated no acute issues.   History of discitis.  Finished antibiotic course 3 weeks ago.  Post discharge follow-up with PCP and neurosurgeon.  Right knee discomfort.  X-ray stable, good range of motion, walks with PT without any problems.  Supportive care.  No major issue.  He claims he fell on it when he was feeling dizzy.  Exam is stable.  With excellent range of motion.  No fluctuance or redness or tenderness on palpation.  Discharge diagnosis     Principal Problem:   C. difficile diarrhea Active Problems:   Arthritis   BPH (benign prostatic hyperplasia)   COPD (chronic obstructive pulmonary disease) (HCC)   GERD (gastroesophageal reflux disease)   Volume depletion   Orthostatic hypotension   C. difficile colitis    Discharge instructions    Discharge Instructions     Diet - low sodium heart healthy   Complete by: As directed    Discharge instructions   Complete by: As directed    Kindly cut down on your narcotics and muscle relaxants as much as you can these cause hypotension and dizziness upon standing up   follow with Primary MD Tisovec, Fransico Him, MD in 7 days   Get CBC, CMP, Magnesium -  checked next visit with your primary MD   Activity: As tolerated with Full fall precautions use walker/cane & assistance as needed  Disposition Home    Diet: Heart Healthy    Special Instructions: If you have smoked or chewed Tobacco  in the last 2 yrs please stop smoking, stop any regular Alcohol   and or any Recreational drug use.  On your next visit with your primary care physician please Get Medicines reviewed and adjusted.  Please request your Prim.MD to go over all Hospital Tests and Procedure/Radiological results at the follow up, please get all Hospital records sent to your Prim MD by signing hospital release before you go home.  If you experience worsening of your admission symptoms, develop shortness of breath, life threatening emergency, suicidal or homicidal thoughts you must seek medical attention immediately by calling 911 or calling your MD immediately  if symptoms less severe.  You Must read complete instructions/literature along with all the possible adverse reactions/side effects for all the Medicines you take and that have been prescribed to you. Take any new Medicines after you have completely understood and accpet all the possible adverse reactions/side effects.   Increase activity slowly   Complete by: As directed        Discharge Medications   Allergies as of 01/12/2022   No Known Allergies      Medication List     TAKE these medications    diazepam 2 MG tablet Commonly known as: VALIUM Take 1 tablet (2 mg total) by mouth every 6 (six) hours as needed for muscle spasms. What changed: when to take this   DULoxetine 60 MG capsule Commonly known as: CYMBALTA Take 1 capsule (60 mg total) by mouth daily.   gabapentin 600 MG tablet Commonly known as: NEURONTIN Take 2 tablets (1,200 mg total) by mouth 3 (three) times daily. What changed: how much to take   HYDROmorphone 4 MG tablet Commonly known as: DILAUDID Take 1 tablet (4 mg total) by mouth every 4 (four) hours as needed for severe pain (and breakthrough pain).   morphine 15 MG 12 hr tablet Commonly known as: MS CONTIN Take 1 tablet (15 mg total) by mouth every 12 (twelve) hours.   omeprazole 20 MG tablet Commonly known as: PRILOSEC OTC Take 20 mg by mouth daily.   primidone 50 MG  tablet Commonly known as: MYSOLINE TAKE 3 TABLETS BY MOUTH EVERY MORNING, 2 TABLET IN THE AFTERNOON AND 3 IN THE EVENING What changed: See the new instructions.   rosuvastatin 20 MG tablet Commonly known as: CRESTOR Take 1 tablet (20 mg total) by mouth daily.   tiZANidine 4 MG tablet Commonly known as: ZANAFLEX Take 1 tablet (4 mg total)  by mouth every 6 (six) hours. What changed: when to take this   vancomycin 125 MG capsule Commonly known as: VANCOCIN Take 1 capsule (125 mg total) by mouth 4 (four) times daily.         Follow-up Information     Tisovec, Fransico Him, MD. Schedule an appointment as soon as possible for a visit in 1 week(s).   Specialty: Internal Medicine Contact information: [address] [place] [address] [phone]                 Major procedures and Radiology Reports - PLEASE review detailed and final reports thoroughly  -      DG Knee Complete 4 Views Right  Result Date: 01/12/2022 CLINICAL DATA:  Recurrent knee pain. EXAM: RIGHT KNEE - COMPLETE 4+ VIEW COMPARISON:  None Available. FINDINGS: No evidence of fracture, dislocation, or joint effusion. Mild soft tissue edema. No significant arthropathy. IMPRESSION: 1. No acute fracture or dislocation identified about the right knee. 2. Mild soft tissue edema. Electronically Signed   By: Fidela Salisbury M.D.   On: 01/12/2022 08:39   CT ABDOMEN PELVIS W CONTRAST  Result Date: 01/09/2022 CLINICAL DATA:  Abdominal pain, diarrhea, dizziness EXAM: CT ABDOMEN AND PELVIS WITH CONTRAST TECHNIQUE: Multidetector CT imaging of the abdomen and pelvis was performed using the standard protocol following bolus administration of intravenous contrast. RADIATION DOSE REDUCTION: This exam was performed according to the departmental dose-optimization program which includes automated exposure control, adjustment of the mA and/or kV according to patient size and/or use of iterative reconstruction technique.  CONTRAST:  44m OMNIPAQUE IOHEXOL 350 MG/ML SOLN COMPARISON:  03/25/2008 FINDINGS: Lower chest: There is no focal infiltrate in the lower lung fields. There is no pleural effusion. Hepatobiliary: Liver measures 19.9 cm in length. No focal abnormalities are seen. There is no dilation of bile ducts. There is high density in the lumen of fundus of the gallbladder. There is no significant wall thickening in gallbladder. Pancreas: No focal abnormalities are seen. Spleen: Unremarkable. Adrenals/Urinary Tract: Adrenals are unremarkable. There is no hydronephrosis. There are no renal or ureteral stones. Urinary bladder is unremarkable. There is mild wall thickening in the fundus and right lateral margin of the urinary bladder. Stomach/Bowel: Stomach is not distended. Small bowel loops are not dilated. Appendix is not seen. There is diffuse wall thickening and mucosal enhancement in the colon, more so in the right colon. There is no loculated pericolic fluid collection. Vascular/Lymphatic: Scattered arterial calcifications and atherosclerotic plaques are seen. Reproductive: Unremarkable. Other: There is no ascites or pneumoperitoneum. Umbilical hernia containing fat is seen. Left inguinal hernia containing fat is seen. Musculoskeletal: Degenerative changes are noted in lumbar spine, particularly severe at L2-L3 and L4-L5 levels with spinal stenosis and encroachment of neural foramina. IMPRESSION: There is a diffuse wall thickening in colon, more so in the ascending and transverse colon suggesting acute inflammatory or infectious colitis. There is no evidence of intestinal obstruction or pneumoperitoneum. There is no hydronephrosis. Gallbladder stones. Enlarged liver. Lumbar spondylosis. Arteriosclerosis. Other findings as described in the body of the report. Electronically Signed   By: PElmer PickerM.D.   On: 01/09/2022 15:59   MR Lumbar Spine W Wo Contrast  Result Date: 01/03/2022 CLINICAL DATA:  Discitis with  epidural abscess suspected. EXAM: MRI LUMBAR SPINE WITHOUT AND WITH CONTRAST TECHNIQUE: Multiplanar and multiecho pulse sequences of the lumbar spine were obtained without and with intravenous contrast. CONTRAST:  7.549mGADAVIST GADOBUTROL 1 MMOL/ML IV SOLN COMPARISON:  12/01/2021  FINDINGS: Segmentation:  5 lumbar type vertebrae Alignment:  Levoscoliosis. Vertebrae: Mild marrow edematous signal with endplate enhancement right eccentric at L2-3 and left eccentric at L4-5, degenerative appearing. No epidural abscess. No fracture or aggressive bone lesion. Conus medullaris and cauda equina: Conus extends to the L1 level. Conus and cauda equina appear normal. Paraspinal and other soft tissues: As above Disc levels: L1-L2: Disc space narrowing and bulging. Facet spurring asymmetric to the right where there is moderate stenosis. L2-L3: Disc narrowing and endplate degeneration with endplate and facet spurring eccentric to the right where there is foraminal impingement. Moderate spinal stenosis. L3-L4: Large right foraminal and extraforaminal extrusion with marked right L3 impingement. Parent disc is circumferentially bulging. Asymmetric left facet spurring. L4-L5: Disc collapse with endplate and facet spurring eccentric to the left where there is advanced foraminal impingement. Advanced spinal stenosis. L5-S1:Mild disc space narrowing.  No neural impingement IMPRESSION: 1. Endplate edema and enhancement at L2-3 and L4-5, stable and degenerative appearing. 2. L3-4 large right foraminal and extraforaminal extrusion with prominent right L3 impingement. 3. Chronic degenerative foraminal impingement on the right at L2-3 and left at L4-5. 4. Advanced L4-5 spinal stenosis. Electronically Signed   By: Jorje Guild M.D.   On: 01/03/2022 08:03    Micro Results    Recent Results (from the past 240 hour(s))  C Difficile Quick Screen w PCR reflex     Status: Abnormal   Collection Time: 01/09/22  1:13 PM   Specimen: STOOL   Result Value Ref Range Status   C Diff antigen POSITIVE (A) NEGATIVE Final   C Diff toxin POSITIVE (A) NEGATIVE Final   C Diff interpretation Toxin producing C. difficile detected.  Final    Comment: CRITICAL RESULT CALLED TO, READ BACK BY AND VERIFIED WITH: RN MEGAN TRIPHAMMER ON 01/09/22 @ 1655 BY DRT Performed at [facility], [place] [address] [place], [place] [address]   Gastrointestinal Panel by PCR , Stool     Status: None   Collection Time: 01/09/22  1:13 PM   Specimen: STOOL  Result Value Ref Range Status   Campylobacter species NOT DETECTED NOT DETECTED Final   Plesimonas shigelloides NOT DETECTED NOT DETECTED Final   Salmonella species NOT DETECTED NOT DETECTED Final   Yersinia enterocolitica NOT DETECTED NOT DETECTED Final   Vibrio species NOT DETECTED NOT DETECTED Final   Vibrio cholerae NOT DETECTED NOT DETECTED Final   Enteroaggregative E coli (EAEC) NOT DETECTED NOT DETECTED Final   Enteropathogenic E coli (EPEC) NOT DETECTED NOT DETECTED Final   Enterotoxigenic E coli (ETEC) NOT DETECTED NOT DETECTED Final   Shiga like toxin producing E coli (STEC) NOT DETECTED NOT DETECTED Final   Shigella/Enteroinvasive E coli (EIEC) NOT DETECTED NOT DETECTED Final   Cryptosporidium NOT DETECTED NOT DETECTED Final   Cyclospora cayetanensis NOT DETECTED NOT DETECTED Final   Entamoeba histolytica NOT DETECTED NOT DETECTED Final   Giardia lamblia NOT DETECTED NOT DETECTED Final   Adenovirus F40/41 NOT DETECTED NOT DETECTED Final   Astrovirus NOT DETECTED NOT DETECTED Final   Norovirus GI/GII NOT DETECTED NOT DETECTED Final   Rotavirus A NOT DETECTED NOT DETECTED Final   Sapovirus (I, II, IV, and V) NOT DETECTED NOT DETECTED Final    Comment: Performed at [facility], [address] [place], [place] [address]    Today   Subjective    Aven Christen today has no headache,no chest abdominal pain,no new weakness tingling or numbness, feels much better wants to  go  home today.    Objective   Blood pressure (!) 149/69, pulse 66, temperature 98.4 F (36.9 C), temperature source Oral, resp. rate 12, height '5\' 9"'$  (1.753 m), weight 75.7 kg, SpO2 94 %.   Intake/Output Summary (Last 24 hours) at 01/12/2022 0937 Last data filed at 01/12/2022 0400 Gross per 24 hour  Intake --  Output 800 ml  Net -800 ml    Exam  Awake Alert, No new F.N deficits,    [place].AT,PERRAL Supple Neck,   Symmetrical Chest wall movement, Good air movement bilaterally, CTAB RRR,No Gallops,   +ve B.Sounds, Abd Soft, Non tender,  No Cyanosis, Clubbing or edema    Data Review   Recent Labs  Lab 01/09/22 1327 01/10/22 0600 01/11/22 0528  WBC 11.8* 8.4 7.4  HGB 10.7* 9.8* 10.0*  HCT 31.2* 29.4* 29.5*  PLT 210 211 211  MCV 93.1 92.2 91.9  MCH 31.9 30.7 31.2  MCHC 34.3 33.3 33.9  RDW 13.3 13.0 12.9  LYMPHSABS 1.7  --  1.7  MONOABS 1.2*  --  0.7  EOSABS 0.2  --  0.2  BASOSABS 0.1  --  0.0    Recent Labs  Lab 01/09/22 1327 01/10/22 0600 01/11/22 0528  NA 135 136 139  K 3.8 3.8 3.7  CL 97* 103 102  CO2 '29 27 29  '$ GLUCOSE 103* 96 96  BUN 9 5* <5*  CREATININE 1.15 0.85 0.98  CALCIUM 8.7* 8.4* 8.3*  AST 16 12* 15  ALT '17 16 14  '$ ALKPHOS 47 42 42  BILITOT 0.3 0.6 0.2*  ALBUMIN 2.7* 2.3* 2.4*  MG  --  1.6* 2.1  LATICACIDVEN 1.0  --   --   TSH  --   --  5.040*  BNP  --  108.7* 171.6*    Total Time in preparing paper work, data evaluation and todays exam - 36 minutes  Lala Lund M.D on 01/12/2022 at 9:37 AM  [facility]

## 2022-01-12 NOTE — Discharge Instructions (Addendum)
Kindly cut down on your narcotics and muscle relaxants as much as you can these cause hypotension and dizziness upon standing up   follow with Primary MD Tisovec, Fransico Him, MD in 7 days   Get CBC, CMP, Magnesium -  checked next visit with your primary MD   Activity: As tolerated with Full fall precautions use walker/cane & assistance as needed  Disposition Home    Diet: Heart Healthy    Special Instructions: If you have smoked or chewed Tobacco  in the last 2 yrs please stop smoking, stop any regular Alcohol  and or any Recreational drug use.  On your next visit with your primary care physician please Get Medicines reviewed and adjusted.  Please request your Prim.MD to go over all Hospital Tests and Procedure/Radiological results at the follow up, please get all Hospital records sent to your Prim MD by signing hospital release before you go home.  If you experience worsening of your admission symptoms, develop shortness of breath, life threatening emergency, suicidal or homicidal thoughts you must seek medical attention immediately by calling 911 or calling your MD immediately  if symptoms less severe.  You Must read complete instructions/literature along with all the possible adverse reactions/side effects for all the Medicines you take and that have been prescribed to you. Take any new Medicines after you have completely understood and accpet all the possible adverse reactions/side effects.

## 2022-01-12 NOTE — Plan of Care (Signed)
  Problem: Education: Goal: Knowledge of General Education information will improve Description: Including pain rating scale, medication(s)/side effects and non-pharmacologic comfort measures Outcome: Adequate for Discharge   Problem: Health Behavior/Discharge Planning: Goal: Ability to manage health-related needs will improve Outcome: Adequate for Discharge   Problem: Clinical Measurements: Goal: Ability to maintain clinical measurements within normal limits will improve Outcome: Adequate for Discharge Goal: Will remain free from infection Outcome: Adequate for Discharge Goal: Diagnostic test results will improve Outcome: Adequate for Discharge Goal: Respiratory complications will improve Outcome: Adequate for Discharge Goal: Cardiovascular complication will be avoided Outcome: Adequate for Discharge   Problem: Activity: Goal: Risk for activity intolerance will decrease Outcome: Adequate for Discharge   Problem: Nutrition: Goal: Adequate nutrition will be maintained Outcome: Adequate for Discharge   Problem: Coping: Goal: Level of anxiety will decrease Outcome: Adequate for Discharge   Problem: Elimination: Goal: Will not experience complications related to bowel motility Outcome: Adequate for Discharge Goal: Will not experience complications related to urinary retention Outcome: Adequate for Discharge   Problem: Pain Managment: Goal: General experience of comfort will improve Outcome: Adequate for Discharge   Problem: Safety: Goal: Ability to remain free from injury will improve Outcome: Adequate for Discharge   Problem: Skin Integrity: Goal: Risk for impaired skin integrity will decrease Outcome: Adequate for Discharge   Problem: Acute Rehab PT Goals(only PT should resolve) Goal: Patient Will Transfer Sit To/From Stand Outcome: Adequate for Discharge Goal: Pt Will Ambulate Outcome: Adequate for Discharge   

## 2022-01-12 NOTE — Progress Notes (Signed)
Bertram Millard to be D/C'd Home per MD order.  Discussed with the patient and all questions fully answered.  VSS, Skin clean, dry and intact without evidence of skin break down, no evidence of skin tears noted. IV catheter discontinued intact. Site without signs and symptoms of complications. Dressing and pressure applied.  An After Visit Summary was printed and given to the patient. Patient received prescription.  D/c education completed with patient/family including follow up instructions, medication list, d/c activities limitations if indicated, with other d/c instructions as indicated by MD - patient able to verbalize understanding, all questions fully answered.   Patient instructed to return to ED, call 911, or call MD for any changes in condition.   Patient escorted via [place], and D/C home via private auto.  [place] 01/12/2022 2:04 PM

## 2022-01-12 NOTE — Progress Notes (Signed)
Discharge instructions (including medications) discussed with and copy provided to patient/caregiver 

## 2022-01-19 ENCOUNTER — Other Ambulatory Visit: Payer: Self-pay | Admitting: Neurosurgery

## 2022-01-19 ENCOUNTER — Encounter: Payer: POS | Admitting: Physical Medicine and Rehabilitation

## 2022-01-30 NOTE — Progress Notes (Signed)
Surgical Instructions    Your procedure is scheduled on Friday December 1st.  Report to [facility] Main Entrance "A" at 6 A.M., then check in with the Admitting office.  Call this number if you have problems the morning of surgery:  [phone]   If you have any questions prior to your surgery date call [phone]: Open Monday-Friday 8am-4pm If you experience any cold or flu symptoms such as cough, fever, chills, shortness of breath, etc. between now and your scheduled surgery, please notify us at the above number     Remember:  Do not eat after midnight the night before your surgery  You may drink clear liquids until 5am the morning of your surgery.   Clear liquids allowed are: Water, Non-Citrus Juices (without pulp), Carbonated Beverages, Clear Tea, Black Coffee ONLY (NO MILK, CREAM OR POWDERED CREAMER of any kind), and Gatorade    Take these medicines the morning of surgery with A SIP OF WATER: DULoxetine (CYMBALTA) 60 MG capsule  gabapentin (NEURONTIN) 600 MG tablet  omeprazole (PRILOSEC OTC) 20 MG tablet  primidone (MYSOLINE) 50 MG tablet  rosuvastatin (CRESTOR) 20 MG tablet     As of today, STOP taking any Aspirin (unless otherwise instructed by your surgeon) Aleve, Naproxen, Ibuprofen, Motrin, Advil, Goody's, BC's, all herbal medications, fish oil, and all vitamins.           Do not wear jewelry  Do not wear lotions, powders, cologne or deodorant. Do not shave 48 hours prior to surgery.  Men may shave face and neck. Do not bring valuables to the hospital. Do not wear nail polish  [facility] is not responsible for any belongings or valuables.    Do NOT Smoke (Tobacco/Vaping)  24 hours prior to your procedure  If you use a CPAP at night, you may bring your mask for your overnight stay.   Contacts, glasses, hearing aids, dentures or partials may not be worn into surgery, please bring cases for these belongings   For patients admitted to the hospital, discharge time  will be determined by your treatment team.   Patients discharged the day of surgery will not be allowed to drive home, and someone needs to stay with them for 24 hours.   SURGICAL WAITING ROOM VISITATION Patients having surgery or a procedure may have no more than 2 support people in the waiting area - these visitors may rotate.   Children under the age of 110 must have an adult with them who is not the patient. If the patient needs to stay at the hospital during part of their recovery, the visitor guidelines for inpatient rooms apply. Pre-op nurse will coordinate an appropriate time for 1 support person to accompany patient in pre-op.  This support person may not rotate.   Please refer to [email or web address] for the visitor guidelines for Inpatients (after your surgery is over and you are in a regular room).    Special instructions:    Oral Hygiene is also important to reduce your risk of infection.  Remember - BRUSH YOUR TEETH THE MORNING OF SURGERY WITH YOUR REGULAR TOOTHPASTE   [facility]- Preparing For Surgery  Before surgery, you can play an important role. Because skin is not sterile, your skin needs to be as free of germs as possible. You can reduce the number of germs on your skin by washing with CHG (chlorahexidine gluconate) Soap before surgery.  CHG is an antiseptic cleaner which kills germs and bonds with the skin to continue killing  germs even after washing.     Please do not use if you have an allergy to CHG or antibacterial soaps. If your skin becomes reddened/irritated stop using the CHG.  Do not shave (including legs and underarms) for at least 48 hours prior to first CHG shower. It is OK to shave your face.  Please follow these instructions carefully.     Shower the NIGHT BEFORE SURGERY and the MORNING OF SURGERY with CHG Soap.   If you chose to wash your hair, wash your hair first as usual with your normal shampoo.  After you shampoo, rinse your hair and body thoroughly to remove the shampoo.  Then [employer] and genitals (private parts) with your normal soap and rinse thoroughly to remove soap.  After that Use CHG Soap as you would any other liquid soap. You can apply CHG directly to the skin and wash gently with a scrungie or a clean washcloth.   Apply the CHG Soap to your body ONLY FROM THE NECK DOWN.  Do not use on open wounds or open sores. Avoid contact with your eyes, ears, mouth and genitals (private parts). Wash Face and genitals (private parts)  with your normal soap.   Wash thoroughly, paying special attention to the area where your surgery will be performed.  Thoroughly rinse your body with warm water from the neck down.  DO NOT shower/wash with your normal soap after using and rinsing off the CHG Soap.  Pat yourself dry with a CLEAN TOWEL.  Wear CLEAN PAJAMAS to bed the night before surgery  Place CLEAN SHEETS on your bed the night before your surgery  DO NOT SLEEP WITH PETS.   Day of Surgery:  Take a shower with CHG soap. Wear Clean/Comfortable clothing the morning of surgery Do not apply any deodorants/lotions.   Remember to brush your teeth WITH YOUR REGULAR TOOTHPASTE.    If you received a COVID test during your pre-op visit, it is requested that you wear a mask when out in public, stay away from anyone that may not be feeling well, and notify your surgeon if you develop symptoms. If you have been in contact with anyone that has tested positive in the last 10 days, please notify your surgeon.    Please read over the following fact sheets that you were given.

## 2022-01-31 ENCOUNTER — Encounter (HOSPITAL_COMMUNITY): Payer: Self-pay

## 2022-01-31 ENCOUNTER — Encounter (HOSPITAL_COMMUNITY)
Admission: RE | Admit: 2022-01-31 | Discharge: 2022-01-31 | Disposition: A | Discharge status: Home / Self Care | Payer: POS | Source: Ambulatory Visit | Attending: Neurosurgery | Admitting: Neurosurgery

## 2022-01-31 ENCOUNTER — Other Ambulatory Visit: Payer: Self-pay

## 2022-01-31 VITALS — BP 136/65 | HR 91 | Temp 97.6°F | Resp 17 | Ht 69.0 in | Wt 163.9 lb

## 2022-01-31 DIAGNOSIS — Z01818 Encounter for other preprocedural examination: Secondary | ICD-10-CM | POA: Diagnosis present

## 2022-01-31 HISTORY — DX: Pneumonia, unspecified organism: J18.9

## 2022-01-31 HISTORY — DX: Malignant (primary) neoplasm, unspecified: C80.1

## 2022-01-31 LAB — SURGICAL PCR SCREEN
MRSA, PCR: NEGATIVE
Staphylococcus aureus: POSITIVE — AB

## 2022-01-31 NOTE — Progress Notes (Signed)
PCP - Dr. Domenick Gong Cardiologist - Dr. Vernell Leep-- Only saw once  PPM/ICD - n/a Device Orders - n/a Rep Notified - n/a  Chest x-ray - 12/01/21 EKG - n/a Stress Test - denies ECHO - denies Cardiac Cath - denies  Sleep Study - denies CPAP - n/a  Fasting Blood Sugar - n/a Checks Blood Sugar _____ times a day- n/a  Last dose of GLP1 agonist-  n/a GLP1 instructions: n/a  Blood Thinner Instructions: n/a Aspirin Instructions: n/a  ERAS Protcol - Clear liquids until 0500 am day of surgery PRE-SURGERY Ensure or G2- None ordered  COVID TEST- n/a   Anesthesia review: Yes. Hospitalization 01/10/22-01/12/22 for dehydration and C-Diff.   CBC/diff and CMP 01/11/22. No need to repeat per Karoline Caldwell, PA.   Patient denies shortness of breath, fever, cough and chest pain at PAT appointment   All instructions explained to the patient, with a verbal understanding of the material. Patient agrees to go over the instructions while at home for a better understanding. The opportunity to ask questions was provided.

## 2022-02-01 NOTE — Progress Notes (Signed)
Anesthesia Chart Review:  Previously evaluated by cardiologist Dr. Virgina Jock for chest pain and SOB. Nuclear stress 04/2017 was low risk, nonischemic. Echo 04/2017 showed EF 55%, no significant valvular abnormalities. Last seen 04/24/18, recommended follow up with cardiology on an as needed basis.   Prolonged hospitalization 9/16 through 12/06/2021 for treatment of discitis.  He was readmitted 11/7 through 01/12/2022 for treatment of C. difficile colitis and dehydration.  Work with PCP outpatient on 01/24/2022.  Noted to be recovering well, no more dizziness or lightheadedness, eating and drinking well.  Noted upcoming lumbar surgery 02/02/2022.  Former smoker with associated COPD.  Quit 11/2021.  Not on any inhaled daily medications.  History of small hiatal hernia.  Patient had CMP and CBC drawn by PCP on 01/24/2022.  Results requested and reviewed.  CMP and CBC were within normal limits.  Copy on patient chart.  Exercise sestamibi stress test 04/19/2017:  1. The patient performed treadmill exercise using a Bruce protocol, completing 5 minutes. The patient completed an estimated workload of 6.98 METS, reaching 92% of the maximum predicted heart rate. The patient did not develop symptoms other than fatigue during the procedure. Low exercise capacity for age. Normal heart rate and blood pressure response to exercise.  2. Stress electrocardiogram is negative for ischemia.  3. The overall quality of the study is good. There is no evidence of abnormal lung activity. Stress and rest SPECT images demonstrate homogeneous tracer distribution throughout the myocardium. Gated SPECT imaging reveals normal myocardial thickening and wall motion. The left ventricular ejection fraction was normal (71%).   4. This is a low risk study.   Echocardiogram 04/19/2017: Left ventricle cavity is normal in size. Mild concentric hypertrophy of the left ventricle. Normal global wall motion. Normal diastolic filling pattern.  Calculated EF 55%. Mild tricuspid regurgitation. Estimated pulmonary artery systolic pressure 25 mmHg.    [facility]ynonia Must[facility]/Anesthesiology Phone [phone] 02/01/2022 3:54 PM

## 2022-02-01 NOTE — Anesthesia Preprocedure Evaluation (Addendum)
Anesthesia Evaluation  Patient identified by MRN, date of birth, ID band Patient awake    Reviewed: Allergy & Precautions, NPO status , Patient's Chart, lab work & pertinent test results  Airway Mallampati: II  TM Distance: >3 FB Neck ROM: Full    Dental  (+) Teeth Intact, Dental Advisory Given   Pulmonary COPD, Patient abstained from smoking., former smoker   Pulmonary exam normal breath sounds clear to auscultation       Cardiovascular Exercise Tolerance: Good negative cardio ROS Normal cardiovascular exam Rhythm:Regular Rate:Normal     Neuro/Psych  PSYCHIATRIC DISORDERS Anxiety     Displacement of lumbar intervertebral disc negative neurological ROS     GI/Hepatic Neg liver ROS, hiatal hernia,GERD  Medicated and Controlled,,  Endo/Other  negative endocrine ROS    Renal/GU negative Renal ROS   H/o testicular cancer s/p radiation    Musculoskeletal  (+) Arthritis ,    Abdominal   Peds  Hematology negative hematology ROS (+)   Anesthesia Other Findings   Reproductive/Obstetrics                             Anesthesia Physical Anesthesia Plan  ASA: 3  Anesthesia Plan: General   Post-op Pain Management: Tylenol PO (pre-op)*   Induction: Intravenous  PONV Risk Score and Plan: 2 and Midazolam, Dexamethasone and Ondansetron  Airway Management Planned: Oral ETT  Additional Equipment:   Intra-op Plan:   Post-operative Plan: Extubation in OR  Informed Consent: I have reviewed the patients History and Physical, chart, labs and discussed the procedure including the risks, benefits and alternatives for the proposed anesthesia with the patient or authorized representative who has indicated his/her understanding and acceptance.     Dental advisory given  Plan Discussed with: CRNA  Anesthesia Plan Comments: (PAT note by Karoline Caldwell, PA-C: Previously evaluated by cardiologist Dr.  Virgina Jock for chest pain and SOB. Nuclear stress 04/2017 was low risk, nonischemic. Echo 04/2017 showed EF 55%, no significant valvular abnormalities. Last seen 04/24/18, recommended follow up with cardiology on an as needed basis.   Prolonged hospitalization 9/16 through 12/06/2021 for treatment of discitis.  He was readmitted 11/7 through 01/12/2022 for treatment of C. difficile colitis and dehydration.  Work with PCP outpatient on 01/24/2022.  Noted to be recovering well, no more dizziness or lightheadedness, eating and drinking well.  Noted upcoming lumbar surgery 02/02/2022.  Former smoker with associated COPD.  Quit 11/2021.  Not on any inhaled daily medications.  History of small hiatal hernia.  Patient had CMP and CBC drawn by PCP on 01/24/2022.  Results requested and reviewed.  CMP and CBC were within normal limits.  Copy on patient chart.  Exercise sestamibi stress test 04/19/2017:  1. The patient performed treadmill exercise using a Bruce protocol, completing 5 minutes. The patient completed an estimated workload of 6.98 METS, reaching 92% of the maximum predicted heart rate. The patient did not develop symptoms other than fatigue during the procedure. Low exercise capacity for age. Normal heart rate and blood pressure response to exercise.  2. Stress electrocardiogram is negative for ischemia.  3. The overall quality of the study is good. There is no evidence of abnormal lung activity. Stress and rest SPECT images demonstrate homogeneous tracer distribution throughout the myocardium. Gated SPECT imaging reveals normal myocardial thickening and wall motion. The left ventricular ejection fraction was normal (71%).  4. This is a low risk study.  Echocardiogram 04/19/2017: Left ventricle  cavity is normal in size. Mild concentric hypertrophy of the left ventricle. Normal global wall motion. Normal diastolic filling pattern. Calculated EF 55%. Mild tricuspid regurgitation. Estimated pulmonary  artery systolic pressure 25 mmHg.   )        Anesthesia Quick Evaluation

## 2022-02-02 ENCOUNTER — Ambulatory Visit (HOSPITAL_COMMUNITY): Payer: POS | Admitting: Physician Assistant

## 2022-02-02 ENCOUNTER — Ambulatory Visit (HOSPITAL_COMMUNITY)
Admission: RE | Disposition: A | Discharge status: Home / Self Care | Payer: Self-pay | Source: Home / Self Care | Attending: Neurosurgery

## 2022-02-02 ENCOUNTER — Other Ambulatory Visit: Payer: Self-pay

## 2022-02-02 ENCOUNTER — Ambulatory Visit (HOSPITAL_COMMUNITY)
Admission: RE | Admit: 2022-02-02 | Discharge: 2022-02-02 | Disposition: A | Discharge status: Home / Self Care | Payer: POS | Attending: Neurosurgery | Admitting: Neurosurgery

## 2022-02-02 ENCOUNTER — Ambulatory Visit (HOSPITAL_COMMUNITY): Payer: POS

## 2022-02-02 ENCOUNTER — Other Ambulatory Visit (HOSPITAL_COMMUNITY): Payer: Self-pay

## 2022-02-02 ENCOUNTER — Ambulatory Visit (HOSPITAL_BASED_OUTPATIENT_CLINIC_OR_DEPARTMENT_OTHER): Payer: POS | Admitting: Physician Assistant

## 2022-02-02 DIAGNOSIS — Z8547 Personal history of malignant neoplasm of testis: Secondary | ICD-10-CM | POA: Diagnosis not present

## 2022-02-02 DIAGNOSIS — K449 Diaphragmatic hernia without obstruction or gangrene: Secondary | ICD-10-CM | POA: Diagnosis not present

## 2022-02-02 DIAGNOSIS — M1990 Unspecified osteoarthritis, unspecified site: Secondary | ICD-10-CM

## 2022-02-02 DIAGNOSIS — F419 Anxiety disorder, unspecified: Secondary | ICD-10-CM | POA: Insufficient documentation

## 2022-02-02 DIAGNOSIS — Z87891 Personal history of nicotine dependence: Secondary | ICD-10-CM | POA: Diagnosis not present

## 2022-02-02 DIAGNOSIS — J449 Chronic obstructive pulmonary disease, unspecified: Secondary | ICD-10-CM

## 2022-02-02 DIAGNOSIS — M5126 Other intervertebral disc displacement, lumbar region: Secondary | ICD-10-CM | POA: Diagnosis present

## 2022-02-02 DIAGNOSIS — Z923 Personal history of irradiation: Secondary | ICD-10-CM | POA: Insufficient documentation

## 2022-02-02 DIAGNOSIS — K219 Gastro-esophageal reflux disease without esophagitis: Secondary | ICD-10-CM | POA: Diagnosis not present

## 2022-02-02 HISTORY — PX: LUMBAR LAMINECTOMY/DECOMPRESSION MICRODISCECTOMY: SHX5026

## 2022-02-02 SURGERY — LUMBAR LAMINECTOMY/DECOMPRESSION MICRODISCECTOMY 1 LEVEL
Anesthesia: General | Laterality: Right

## 2022-02-02 MED ORDER — ACETAMINOPHEN 500 MG PO TABS: 1000.0000 mg | ORAL_TABLET | Freq: Once | ORAL | Status: AC

## 2022-02-02 MED ORDER — PROPOFOL 10 MG/ML IV BOLUS
INTRAVENOUS | Status: DC | PRN
  Administered 2022-02-02: 150 mg via INTRAVENOUS

## 2022-02-02 MED ORDER — FENTANYL CITRATE (PF) 250 MCG/5ML IJ SOLN
INTRAMUSCULAR | Status: DC | PRN
  Administered 2022-02-02: 50 ug via INTRAVENOUS
  Administered 2022-02-02: 100 ug via INTRAVENOUS
  Administered 2022-02-02: 50 ug via INTRAVENOUS

## 2022-02-02 MED ORDER — TIZANIDINE HCL 4 MG PO TABS: 4.0000 mg | ORAL_TABLET | Freq: Four times a day (QID) | ORAL | 0 refills | Status: DC | PRN

## 2022-02-02 MED ORDER — FENTANYL CITRATE (PF) 100 MCG/2ML IJ SOLN: 25.0000 ug | INTRAMUSCULAR | Status: DC | PRN

## 2022-02-02 MED ORDER — PHENYLEPHRINE HCL-NACL 20-0.9 MG/250ML-% IV SOLN
INTRAVENOUS | Status: DC | PRN
  Administered 2022-02-02: 60 ug/min via INTRAVENOUS

## 2022-02-02 MED ORDER — SUGAMMADEX SODIUM 200 MG/2ML IV SOLN
INTRAVENOUS | Status: DC | PRN
  Administered 2022-02-02: 200 mg via INTRAVENOUS

## 2022-02-02 MED ORDER — OXYCODONE HCL 5 MG PO TABS
5.0000 mg | ORAL_TABLET | Freq: Four times a day (QID) | ORAL | 0 refills | Status: AC | PRN
  Filled 2022-02-02: qty 30, 8d supply, fill #0

## 2022-02-02 MED ORDER — FENTANYL CITRATE (PF) 100 MCG/2ML IJ SOLN
INTRAMUSCULAR | Status: AC
  Filled 2022-02-02: qty 2

## 2022-02-02 MED ORDER — FENTANYL CITRATE (PF) 250 MCG/5ML IJ SOLN
INTRAMUSCULAR | Status: AC
  Filled 2022-02-02: qty 5

## 2022-02-02 MED ORDER — ONDANSETRON HCL 4 MG/2ML IJ SOLN: 4.0000 mg | Freq: Once | INTRAMUSCULAR | Status: DC | PRN

## 2022-02-02 MED ORDER — PROPOFOL 10 MG/ML IV BOLUS
INTRAVENOUS | Status: AC
  Filled 2022-02-02: qty 20

## 2022-02-02 MED ORDER — LACTATED RINGERS IV SOLN: INTRAVENOUS | Status: DC

## 2022-02-02 MED ORDER — CHLORHEXIDINE GLUCONATE 0.12 % MT SOLN
OROMUCOSAL | Status: AC
  Administered 2022-02-02: 15 mL via OROMUCOSAL
  Filled 2022-02-02: qty 15

## 2022-02-02 MED ORDER — CHLORHEXIDINE GLUCONATE 0.12 % MT SOLN: 15.0000 mL | Freq: Once | OROMUCOSAL | Status: AC

## 2022-02-02 MED ORDER — THROMBIN (RECOMBINANT) 5000 UNITS EX SOLR: CUTANEOUS | Status: DC | PRN

## 2022-02-02 MED ORDER — BUPIVACAINE HCL (PF) 0.5 % IJ SOLN
INTRAMUSCULAR | Status: AC
  Filled 2022-02-02: qty 30

## 2022-02-02 MED ORDER — LIDOCAINE-EPINEPHRINE 0.5 %-1:200000 IJ SOLN
INTRAMUSCULAR | Status: DC | PRN
  Administered 2022-02-02: 10 mL

## 2022-02-02 MED ORDER — DEXAMETHASONE SODIUM PHOSPHATE 10 MG/ML IJ SOLN
INTRAMUSCULAR | Status: AC
  Filled 2022-02-02: qty 1

## 2022-02-02 MED ORDER — BUPIVACAINE HCL (PF) 0.5 % IJ SOLN
INTRAMUSCULAR | Status: DC | PRN
  Administered 2022-02-02: 30 mL

## 2022-02-02 MED ORDER — CHLORHEXIDINE GLUCONATE CLOTH 2 % EX PADS: 6.0000 | MEDICATED_PAD | Freq: Once | CUTANEOUS | Status: DC

## 2022-02-02 MED ORDER — ACETAMINOPHEN 500 MG PO TABS
ORAL_TABLET | ORAL | Status: AC
  Administered 2022-02-02: 1000 mg via ORAL
  Filled 2022-02-02: qty 2

## 2022-02-02 MED ORDER — ORAL CARE MOUTH RINSE: 15.0000 mL | Freq: Once | OROMUCOSAL | Status: AC

## 2022-02-02 MED ORDER — LIDOCAINE 2% (20 MG/ML) 5 ML SYRINGE
INTRAMUSCULAR | Status: AC
  Filled 2022-02-02: qty 5

## 2022-02-02 MED ORDER — CEFAZOLIN SODIUM-DEXTROSE 2-3 GM-%(50ML) IV SOLR
INTRAVENOUS | Status: DC | PRN
  Administered 2022-02-02: 2 g via INTRAVENOUS

## 2022-02-02 MED ORDER — 0.9 % SODIUM CHLORIDE (POUR BTL) OPTIME
TOPICAL | Status: DC | PRN
  Administered 2022-02-02: 1000 mL

## 2022-02-02 MED ORDER — METHYLPREDNISOLONE ACETATE 80 MG/ML IJ SUSP
INTRAMUSCULAR | Status: AC
  Filled 2022-02-02: qty 1

## 2022-02-02 MED ORDER — MIDAZOLAM HCL 2 MG/2ML IJ SOLN
INTRAMUSCULAR | Status: DC | PRN
  Administered 2022-02-02: 2 mg via INTRAVENOUS

## 2022-02-02 MED ORDER — DEXAMETHASONE SODIUM PHOSPHATE 10 MG/ML IJ SOLN
INTRAMUSCULAR | Status: DC | PRN
  Administered 2022-02-02: 10 mg via INTRAVENOUS

## 2022-02-02 MED ORDER — LIDOCAINE-EPINEPHRINE 0.5 %-1:200000 IJ SOLN
INTRAMUSCULAR | Status: AC
  Filled 2022-02-02: qty 1

## 2022-02-02 MED ORDER — FENTANYL CITRATE (PF) 100 MCG/2ML IJ SOLN
INTRAMUSCULAR | Status: DC | PRN
  Administered 2022-02-02: 100 ug via INTRAVENOUS

## 2022-02-02 MED ORDER — METHYLPREDNISOLONE ACETATE 80 MG/ML IJ SUSP
INTRAMUSCULAR | Status: DC | PRN
  Administered 2022-02-02: 80 mg

## 2022-02-02 MED ORDER — ONDANSETRON HCL 4 MG/2ML IJ SOLN
INTRAMUSCULAR | Status: DC | PRN
  Administered 2022-02-02: 4 mg via INTRAVENOUS

## 2022-02-02 MED ORDER — CEFAZOLIN SODIUM-DEXTROSE 2-4 GM/100ML-% IV SOLN
INTRAVENOUS | Status: AC
  Filled 2022-02-02: qty 100

## 2022-02-02 MED ORDER — ROCURONIUM BROMIDE 10 MG/ML (PF) SYRINGE
PREFILLED_SYRINGE | INTRAVENOUS | Status: AC
  Filled 2022-02-02: qty 10

## 2022-02-02 MED ORDER — THROMBIN 5000 UNITS EX SOLR
CUTANEOUS | Status: AC
  Filled 2022-02-02: qty 10000

## 2022-02-02 MED ORDER — MIDAZOLAM HCL 2 MG/2ML IJ SOLN
INTRAMUSCULAR | Status: AC
  Filled 2022-02-02: qty 2

## 2022-02-02 MED ORDER — ONDANSETRON HCL 4 MG/2ML IJ SOLN
INTRAMUSCULAR | Status: AC
  Filled 2022-02-02: qty 2

## 2022-02-02 MED ORDER — LIDOCAINE 2% (20 MG/ML) 5 ML SYRINGE
INTRAMUSCULAR | Status: DC | PRN
  Administered 2022-02-02: 100 mg via INTRAVENOUS

## 2022-02-02 MED ORDER — CEFAZOLIN SODIUM-DEXTROSE 2-4 GM/100ML-% IV SOLN: 2.0000 g | INTRAVENOUS | Status: DC

## 2022-02-02 MED ORDER — ROCURONIUM BROMIDE 10 MG/ML (PF) SYRINGE
PREFILLED_SYRINGE | INTRAVENOUS | Status: DC | PRN
  Administered 2022-02-02: 60 mg via INTRAVENOUS
  Administered 2022-02-02: 20 mg via INTRAVENOUS

## 2022-02-02 MED ORDER — SUCCINYLCHOLINE CHLORIDE 200 MG/10ML IV SOSY
PREFILLED_SYRINGE | INTRAVENOUS | Status: AC
  Filled 2022-02-02: qty 10

## 2022-02-02 SURGICAL SUPPLY — 52 items
ADH SKN CLS APL DERMABOND .7 (GAUZE/BANDAGES/DRESSINGS) ×1
APL SKNCLS STERI-STRIP NONHPOA (GAUZE/BANDAGES/DRESSINGS)
BAG COUNTER SPONGE SURGICOUNT (BAG) ×1 IMPLANT
BAG SPNG CNTER NS LX DISP (BAG) ×1
BAND INSRT 18 STRL LF DISP RB (MISCELLANEOUS) ×2
BAND RUBBER #18 3X1/16 STRL (MISCELLANEOUS) ×2 IMPLANT
BENZOIN TINCTURE PRP APPL 2/3 (GAUZE/BANDAGES/DRESSINGS) IMPLANT
BLADE CLIPPER SURG (BLADE) IMPLANT
BUR MATCHSTICK NEURO 3.0 LAGG (BURR) ×1 IMPLANT
BUR PRECISION FLUTE 5.0 (BURR) IMPLANT
CANISTER SUCT 3000ML PPV (MISCELLANEOUS) ×1 IMPLANT
DERMABOND ADVANCED .7 DNX12 (GAUZE/BANDAGES/DRESSINGS) ×1 IMPLANT
DRAPE LAPAROTOMY 100X72X124 (DRAPES) ×1 IMPLANT
DRAPE MICROSCOPE SLANT 54X150 (MISCELLANEOUS) ×1 IMPLANT
DRAPE SURG 17X23 STRL (DRAPES) ×1 IMPLANT
DURAPREP 26ML APPLICATOR (WOUND CARE) ×1 IMPLANT
ELECT REM PT RETURN 9FT ADLT (ELECTROSURGICAL) ×1
ELECTRODE REM PT RTRN 9FT ADLT (ELECTROSURGICAL) ×1 IMPLANT
GAUZE 4X4 16PLY ~~LOC~~+RFID DBL (SPONGE) IMPLANT
GAUZE SPONGE 4X4 12PLY STRL (GAUZE/BANDAGES/DRESSINGS) IMPLANT
GLOVE BIO SURGEON STRL SZ 6.5 (GLOVE) IMPLANT
GLOVE BIOGEL PI IND STRL 7.5 (GLOVE) IMPLANT
GLOVE ECLIPSE 6.5 STRL STRAW (GLOVE) ×1 IMPLANT
GLOVE ECLIPSE 9.0 STRL (GLOVE) IMPLANT
GLOVE EXAM NITRILE XL STR (GLOVE) IMPLANT
GOWN STRL REUS W/ TWL LRG LVL3 (GOWN DISPOSABLE) ×2 IMPLANT
GOWN STRL REUS W/ TWL XL LVL3 (GOWN DISPOSABLE) IMPLANT
GOWN STRL REUS W/TWL 2XL LVL3 (GOWN DISPOSABLE) IMPLANT
GOWN STRL REUS W/TWL LRG LVL3 (GOWN DISPOSABLE) ×3
GOWN STRL REUS W/TWL XL LVL3 (GOWN DISPOSABLE) ×1
KIT BASIN OR (CUSTOM PROCEDURE TRAY) ×1 IMPLANT
KIT TURNOVER KIT B (KITS) ×1 IMPLANT
MARKER SKIN DUAL TIP RULER LAB (MISCELLANEOUS) IMPLANT
NDL HYPO 25X1 1.5 SAFETY (NEEDLE) ×1 IMPLANT
NDL SPNL 18GX3.5 QUINCKE PK (NEEDLE) IMPLANT
NEEDLE HYPO 25X1 1.5 SAFETY (NEEDLE) ×1 IMPLANT
NEEDLE SPNL 18GX3.5 QUINCKE PK (NEEDLE) ×1 IMPLANT
NS IRRIG 1000ML POUR BTL (IV SOLUTION) ×1 IMPLANT
PACK LAMINECTOMY NEURO (CUSTOM PROCEDURE TRAY) ×1 IMPLANT
PAD ARMBOARD 7.5X6 YLW CONV (MISCELLANEOUS) ×3 IMPLANT
SPIKE FLUID TRANSFER (MISCELLANEOUS) ×1 IMPLANT
SPONGE SURGIFOAM ABS GEL SZ50 (HEMOSTASIS) ×1 IMPLANT
SPONGE T-LAP 4X18 ~~LOC~~+RFID (SPONGE) IMPLANT
STRIP CLOSURE SKIN 1/2X4 (GAUZE/BANDAGES/DRESSINGS) IMPLANT
SUT VIC AB 0 CT1 18XCR BRD8 (SUTURE) ×1 IMPLANT
SUT VIC AB 0 CT1 8-18 (SUTURE) ×1
SUT VIC AB 2-0 CT1 18 (SUTURE) ×1 IMPLANT
SUT VIC AB 3-0 SH 8-18 (SUTURE) ×1 IMPLANT
SYR CONTROL 10ML LL (SYRINGE) IMPLANT
TOWEL GREEN STERILE (TOWEL DISPOSABLE) ×1 IMPLANT
TOWEL GREEN STERILE FF (TOWEL DISPOSABLE) ×1 IMPLANT
WATER STERILE IRR 1000ML POUR (IV SOLUTION) ×1 IMPLANT

## 2022-02-02 NOTE — H&P (Signed)
Cameron [name] is an 71 y.o. male.   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 and 15 radiation tx   Hx of gynecomastia    due to hcg shots for male infertility   Insomnia    Nocturia    Pneumonia    Tobacco abuse    Varicocele    Wears glasses    for reading    Past Surgical History:  Procedure Laterality Date   APPENDECTOMY  1965   open   IR LUMBAR [place] W/IMG GUIDE  11/13/2021   MASTECTOMY     1 sx for each breast enalrgement sx was 10 yrs apaprt   ORCHIECTOMY  2005   right   surgery for undescended testicle  age 68   right   THULIUM LASER TURP (TRANSURETHRAL RESECTION OF PROSTATE) N/A 06/10/2020   Procedure: THULIUM LASER TURP (TRANSURETHRAL RESECTION OF PROSTATE);  Surgeon: Festus Aloe, MD;  Location: [facility];  Service: Urology;  Laterality: N/A;   varicocoele  24 or 1984    Family History  Problem Relation Age of Onset   Prostate cancer Father    Stroke Mother    Prostate cancer Brother    Hypertension Sister    High Cholesterol Sister    Hypertension Sister    Memory loss Sister    Social History:  reports that he quit smoking about 2 months ago. His smoking use included cigarettes. He has a 60.00 pack-year smoking history. He quit smokeless tobacco use about 2 months ago.  His smokeless tobacco use included chew. He reports that he does not currently use alcohol. He reports that he  does not use drugs.  Allergies: No Known Allergies  Medications Prior to Admission  Medication Sig Dispense Refill   clonazePAM (KLONOPIN) 0.5 MG tablet Take 0.25 mg by mouth at bedtime.     DULoxetine (CYMBALTA) 60 MG capsule Take 1 capsule (60 mg total) by mouth daily. 30 capsule 0   gabapentin (NEURONTIN) 600 MG tablet Take 2 tablets (1,200 mg total) by mouth 3 (three) times daily. (Patient taking differently: Take 600 mg by mouth 2 (two) times daily.) 180 tablet 0   omeprazole (PRILOSEC OTC) 20 MG tablet Take 20 mg by mouth daily.     primidone (MYSOLINE) 50 MG tablet TAKE 3 TABLETS BY MOUTH EVERY MORNING, 2 TABLET IN THE AFTERNOON AND 3 IN THE EVENING (Patient taking differently: Take 100-150 mg by mouth as directed. Take 150 mg (3 tablets) by mouth every morning, 100 mg (2 tablets) in the afternoon, and 150 mg (3 tablets) at night.) 720 tablet 1   rosuvastatin (CRESTOR) 20 MG tablet Take 1 tablet (20 mg total) by mouth daily. 30 tablet 0   diazepam (VALIUM) 2 MG tablet Take 1 tablet (2 mg total) by mouth every 6 (six) hours as needed for muscle  spasms. (Patient not taking: Reported on 01/29/2022) 20 tablet 0   HYDROmorphone (DILAUDID) 4 MG tablet Take 1 tablet (4 mg total) by mouth every 4 (four) hours as needed for severe pain (and breakthrough pain). (Patient not taking: Reported on 01/29/2022) 28 tablet 0   morphine (MS CONTIN) 15 MG 12 hr tablet Take 1 tablet (15 mg total) by mouth every 12 (twelve) hours. (Patient not taking: Reported on 01/29/2022) 30 tablet 0   PREVIDENT 5000 BOOSTER PLUS 1.1 % PSTE Place 1 Application onto teeth at bedtime.     tiZANidine (ZANAFLEX) 4 MG tablet Take 1 tablet (4 mg total) by mouth every 6 (six) hours. (Patient not taking: Reported on 01/29/2022) 120 tablet 0   vancomycin (VANCOCIN) 125 MG capsule Take 1 capsule (125 mg total) by mouth 4 (four) times daily. (Patient not taking: Reported on 01/29/2022) 56 capsule 0    Results for orders placed or  performed during the hospital encounter of 01/31/22 (from the past 48 hour(s))  Surgical pcr screen     Status: Abnormal   Collection Time: 01/31/22  1:49 PM   Specimen: Nasal Mucosa; Nasal Swab  Result Value Ref Range   MRSA, PCR NEGATIVE NEGATIVE   Staphylococcus aureus POSITIVE (A) NEGATIVE    Comment: (NOTE) The Xpert SA Assay (FDA approved for NASAL specimens in patients 26 years of age and older), is one component of a comprehensive surveillance program. It is not intended to diagnose infection nor to guide or monitor treatment. Performed at [facility], [place] [address] [place], [place] [address]    No results found.  Review of Systems  Constitutional:  Positive for activity change.  HENT: Negative.    Eyes: Negative.   Respiratory: Negative.    Cardiovascular: Negative.   Gastrointestinal: Negative.   Genitourinary: Negative.   Musculoskeletal:  Positive for back pain and gait problem.  Allergic/Immunologic: Negative.   Neurological:  Positive for weakness.  Hematological: Negative.   Psychiatric/Behavioral: Negative.      Blood pressure (!) 141/75, temperature 97.6 F (36.4 C), temperature source Oral, resp. rate 18, height '5\' 9"'$  (1.753 m), weight 74.8 kg, SpO2 99 %. Physical Exam Constitutional:      General: He is not in acute distress.    Appearance: Normal appearance.  HENT:     Head: Normocephalic and atraumatic.     Right Ear: External ear normal.     Left Ear: External ear normal.     Nose: Nose normal.     Mouth/Throat:     Mouth: Mucous membranes are moist.     Pharynx: Oropharynx is clear.  Eyes:     Extraocular Movements: Extraocular movements intact.     Pupils: Pupils are equal, round, and reactive to light.  Cardiovascular:     Rate and Rhythm: Normal rate and regular rhythm.  Pulmonary:     Effort: Pulmonary effort is normal.  Abdominal:     General: Abdomen is flat.     Palpations: Abdomen is soft.  Musculoskeletal:         General: Normal range of motion.     Cervical back: Normal range of motion.  Skin:    General: Skin is warm and dry.  Neurological:     Mental Status: He is alert and oriented to person, place, and time.     Motor: Weakness present.     Gait: Gait abnormal.  Psychiatric:        Mood and Affect: Mood normal.  Behavior: Behavior normal.        Thought Content: Thought content normal.        Judgment: Judgment normal.      Assessment/Plan OR for far lateral L3/4 discetomy on the right.  BP (!) 141/75   Temp 97.6 F (36.4 C) (Oral)   Resp 18   Ht '5\' 9"'$  (1.753 m)   Wt 74.8 kg   SpO2 99%   BMI 24.37 kg/m  Bertram Millard has decided to undergo a lumbar discetomy/decompression for herniated disc at levels L3/4 on the right. Risks and benefits including but not limited to bleeding, infection, paralysis, weakness in one or both extremities, bowel and/or bladder dysfunction, need for further surgery, no relief of pain. Bertram Millard understands and wishes to proceed.  Ashok Pall, MD 02/02/2022, 8:31 AM

## 2022-02-02 NOTE — Anesthesia Procedure Notes (Addendum)
Procedure Name: Intubation Date/Time: 02/02/2022 8:47 AM  Performed by: Lavell Luster, CRNAPre-anesthesia Checklist: Patient identified, Emergency Drugs available, Suction available, Patient being monitored and Timeout performed Patient Re-evaluated:Patient Re-evaluated prior to induction Oxygen Delivery Method: Circle system utilized Preoxygenation: Pre-oxygenation with 100% oxygen Induction Type: IV induction Ventilation: Mask ventilation without difficulty Laryngoscope Size: Mac and 4 Grade View: Grade I Tube type: Oral Tube size: 7.5 mm Number of attempts: 1 Airway Equipment and Method: Stylet Placement Confirmation: ETT inserted through vocal cords under direct vision, positive ETCO2 and breath sounds checked- equal and bilateral Secured at: 22 cm Tube secured with: Tape Dental Injury: Teeth and Oropharynx as per pre-operative assessment

## 2022-02-02 NOTE — Discharge Instructions (Addendum)
Lumbar Discectomy °Care After °A discectomy involves removal of discmaterial (the cartilage-like structures located between the bones of the back). It is done to relieve pressure on nerve roots. It can be used as a treatment for a back problem. The time in surgery depends on the findings in surgery and what is necessary to correct the problems. °HOME CARE INSTRUCTIONS  °· Check the cut (incision) made by the surgeon twice a day for signs of infection. Some signs of infection may include:  °· A foul smelling, greenish or yellowish discharge from the wound.  °· Increased pain.  °· Increased redness over the incision (operative) site.  °· The skin edges may separate.  °· Flu-like symptoms (problems).  °· A temperature above 101.5° F (38.6° C).  °· Change your bandages in about 24 to 36 hours following surgery or as directed.  °· You may shower tomrrow.  Avoid bathtubs, swimming pools and hot tubs for three weeks or until your incision has healed completely. °· Follow your doctor's instructions as to safe activities, exercises, and physical therapy.  °· Weight reduction may be beneficial if you are overweight.  °· Daily exercise is helpful to prevent the return of problems. Walking is permitted. You may use a treadmill without an incline. Cut down on activities and exercise if you have discomfort. You may also go up and down stairs as much as you can tolerate.  °· DO NOT lift anything heavier than 10 to 15 lbs. Avoid bending or twisting at the waist. Always bend your knees when lifting.  °· Maintain strength and range of motion as instructed.  °· Do not drive for 10 days, or as directed by your doctors. You may be a passenger . Lying back in the passenger seat may be more comfortable for you. Always wear a seatbelt.  °· Limit your sitting in a regular chair to 20 to 30 minutes at a time. There are no limitations for sitting in a recliner. You should lie down or walk in between sitting periods.  °· Only take  over-the-counter or prescription medicines for pain, discomfort, or fever as directed by your caregiver.  °SEEK MEDICAL CARE IF:  °· There is increased bleeding (more than a small spot) from the wound.  °· You notice redness, swelling, or increasing pain in the wound.  °· Pus is coming from wound.  °· You develop an unexplained oral temperature above 102° F (38.9° C) develops.  °· You notice a foul smell coming from the wound or dressing.  °· You have increasing pain in your wound.  °SEEK IMMEDIATE MEDICAL CARE IF:  °· You develop a rash.  °· You have difficulty breathing.  °· You develop any allergic problems to medicines given.  °Document Released: 01/25/2004 Document Revised: 02/08/2011 Document Reviewed: 05/15/2007 °ExitCare® Patient Information °

## 2022-02-02 NOTE — Transfer of Care (Signed)
Immediate Anesthesia Transfer of Care Note  Patient: Cameron [name]  Procedure(s) Performed: Right Lumbar Three-Four Far Lateral Microdiscectomy (Right)  Patient Location: PACU  Anesthesia Type:General  Level of Consciousness: awake, alert , and sedated  Airway & Oxygen Therapy: Patient connected to face mask oxygen  Post-op Assessment: Post -op Vital signs reviewed and stable  Post vital signs: stable  Last Vitals:  Vitals Value Taken Time  BP 134/64 02/02/22 1035  Temp    Pulse 79 02/02/22 1035  Resp    SpO2 98 % 02/02/22 1035  Vitals shown include unvalidated device data.  Last Pain:  Vitals:   02/02/22 0627  TempSrc:   PainSc: 1          Complications: No notable events documented.

## 2022-02-02 NOTE — Anesthesia Postprocedure Evaluation (Signed)
Anesthesia Post Note  Patient: Cameron [name]  Procedure(s) Performed: Right Lumbar Three-Four Far Lateral Microdiscectomy (Right)     Patient location during evaluation: PACU Anesthesia Type: General Level of consciousness: awake and alert Pain management: pain level controlled Vital Signs Assessment: post-procedure vital signs reviewed and stable Respiratory status: spontaneous breathing, nonlabored ventilation, respiratory function stable and patient connected to nasal cannula oxygen Cardiovascular status: blood pressure returned to baseline and stable Postop Assessment: no apparent nausea or vomiting Anesthetic complications: no   No notable events documented.  Last Vitals:  Vitals:   02/02/22 1100 02/02/22 1115  BP: 108/67 129/61  Pulse: 81 86  Resp: 14 14  Temp:  (!) 36.2 C  SpO2: 98% 92%    Last Pain:  Vitals:   02/02/22 1115  TempSrc:   PainSc: 0-No pain                 Santa Lighter

## 2022-02-02 NOTE — Op Note (Signed)
02/02/2022  11:07 AM  PATIENT:  Cameron [name]  71 y.o. male With a large right far lateral disc herniation at L3/4 PRE-OPERATIVE DIAGNOSIS:  Displacement of lumbar intervertebral disc  POST-OPERATIVE DIAGNOSIS:  Displacement of lumbar intervertebral disc  PROCEDURE:  Procedure(s): Right Lumbar Three-Four Far Lateral Microdiscectomy  SURGEON:   Surgeon(s): Ashok Pall, MD Earnie Larsson, MD  ASSISTANTS:Pool, Mallie Mussel  ANESTHESIA:   general  EBL:  Total I/O In: 0569 [I.V.:1300; IV Piggyback:50] Out: -   BLOOD ADMINISTERED:none  CELL SAVER GIVEN:none  COUNT:per nursing  DRAINS: none   SPECIMEN:  No Specimen  DICTATION: Cameron [name] was taken to the operating room, intubated and placed under a general anesthetic without difficulty. He was positioned prone on a Wilson frame with all pressure points padded. HIs back was prepped and draped in a sterile manner. I opened the skin with a 10 blade and carried the dissection down to the thoracolumbar fascia. I used both sharp dissection and the monopolar cautery to expose the lamina of L3, and L2. I confirmed my location with an intraoperative xray.  I used the drill, Kerrison punches, and curettes to perform a semihemilaminectomy of L3, specifically at the pars interarticularis. I used the punches to remove the ligamentum flavum to expose the L3 nerve root. I brought the microscope into the operative field and with Dr.Pool's assistance we started our decompression of the spinal canal, thecal sac and L3 root(s). I cauterized epidural veins overlying the disc space then divided them sharply. I opened the disc space with a 15 blade and proceeded with the discectomy. I used pituitary rongeurs, curettes, and other instruments to remove disc material. After the discectomy was completed we inspected the L3 nerve root and felt it was well decompressed. I explored rostrally, laterally, medially, and caudally and was satisfied with the decompression. I  irrigated the wound, then closed in layers. I approximated the thoracolumbar fascia, subcutaneous, and subcuticular planes with vicryl sutures. I used dermabond for a sterile dressing.   PLAN OF CARE: Discharge to home after PACU  PATIENT DISPOSITION:  PACU - hemodynamically stable.   Delay start of Pharmacological VTE agent (>24hrs) due to surgical blood loss or risk of bleeding:  no

## 2022-02-03 ENCOUNTER — Encounter (HOSPITAL_COMMUNITY): Payer: Self-pay | Admitting: Neurosurgery

## 2022-02-06 MED FILL — Thrombin For Soln 5000 Unit: CUTANEOUS | Qty: 2 | Status: AC

## 2022-02-15 ENCOUNTER — Other Ambulatory Visit (HOSPITAL_COMMUNITY): Payer: Self-pay

## 2022-03-31 NOTE — Progress Notes (Unsigned)
Assessment/Plan:    1.  Essential Tremor  -continue primidone, 50 mg, 3 tablets in the morning, 2 in the afternoon, 3 in the evening.  Do not think another increase will help significantly in terms of tremor control.  -cannot take propranolol - had syncope with it due to low BP  -Discussed with him that while there are probably other medications to try, I do not think that they are going to be significantly impactful for this degree of tremor, especially the tremor on the right hand.  We discussed artane but both of [place] agree that it likely is not going to be that much help and there are significant anticholinergic risks to this medication.  The only impactful thing would likely be surgical interventions.  Patient also knows that he needs to discontinue smoking in order to have DBS surgery.  2.  History of discitis and disc herniation  -Status post surgical intervention in December, 2023 with Dr. Christella Noa   3..f/u 8 months to 1 year  Subjective:   Cameron [name] was seen today in follow up for essential tremor.  My previous records were reviewed prior to todays visit.  We have not changed his medications, because we did not think this would make a significant difference.  We did discuss taking it faithfully, and have discussed surgical interventions for tremor extensively.  He reports today that tremor is ***.  He is still smoking.  Separately from the above, the patient has had a rough time since our last visit.  He was in the hospital in September.  He had severe back pain after he had been splitting wood outside.  It turns out that he had discitis and was admitted to the hospital for this and subsequently went to rehab.  Not long after that, he developed C. difficile from the antibiotics.  Just last month, he had L3-L4 microdiscectomy with Dr. Christella Noa.  Current prescribed movement disorder medications: Primidone, 50 mg, 3 tablets in the morning, 2 in the afternoon, 3 in the evening (he  misses middle of the day dosage 50% of the time)   Previous medications: Inderal, 20 mg twice per day (syncope)   ALLERGIES:  No Known Allergies  CURRENT MEDICATIONS:  Outpatient Encounter Medications as of 04/02/2022  Medication Sig   clonazePAM (KLONOPIN) 0.5 MG tablet Take 0.25 mg by mouth at bedtime.   DULoxetine (CYMBALTA) 60 MG capsule Take 1 capsule (60 mg total) by mouth daily.   gabapentin (NEURONTIN) 600 MG tablet Take 2 tablets (1,200 mg total) by mouth 3 (three) times daily. (Patient taking differently: Take 600 mg by mouth 2 (two) times daily.)   omeprazole (PRILOSEC OTC) 20 MG tablet Take 20 mg by mouth daily.   PREVIDENT 5000 BOOSTER PLUS 1.1 % PSTE Place 1 Application onto teeth at bedtime.   primidone (MYSOLINE) 50 MG tablet TAKE 3 TABLETS BY MOUTH EVERY MORNING, 2 TABLET IN THE AFTERNOON AND 3 IN THE EVENING (Patient taking differently: Take 100-150 mg by mouth as directed. Take 150 mg (3 tablets) by mouth every morning, 100 mg (2 tablets) in the afternoon, and 150 mg (3 tablets) at night.)   rosuvastatin (CRESTOR) 20 MG tablet Take 1 tablet (20 mg total) by mouth daily.   tiZANidine (ZANAFLEX) 4 MG tablet Take 1 tablet (4 mg total) by mouth every 6 (six) hours. (Patient not taking: Reported on 01/29/2022)   tiZANidine (ZANAFLEX) 4 MG tablet Take 1 tablet (4 mg total) by mouth every 6 (six)  hours as needed for muscle spasms.   No facility-administered encounter medications on file as of 04/02/2022.     Objective:    PHYSICAL EXAMINATION:    VITALS:   There were no vitals filed for this visit.      GEN:  The patient appears stated age and is in NAD. HEENT:  Normocephalic, atraumatic.  The mucous membranes are moist.   Neurological examination:  Orientation: The patient is alert and oriented x3. Cranial nerves: There is good facial symmetry. The speech is fluent and clear. Soft palate rises symmetrically and there is no tongue deviation. Hearing is intact to  conversational tone. Sensation: Sensation is intact to light touch throughout Motor: Strength is at least antigravity x4.  Movement examination: Tone: There is normal tone in the UE/LE Abnormal movements: there is RUE >> left upper extremity postural and intention tremor.  It is moderate on the right and mild to moderate on the left. Coordination:  There is no decremation with RAM's  I have reviewed and interpreted the following labs independently   Chemistry      Component Value Date/Time   NA 139 01/11/2022 0528   K 3.7 01/11/2022 0528   CL 102 01/11/2022 0528   CO2 29 01/11/2022 0528   BUN <5 (L) 01/11/2022 0528   CREATININE 0.98 01/11/2022 0528      Component Value Date/Time   CALCIUM 8.3 (L) 01/11/2022 0528   ALKPHOS 42 01/11/2022 0528   AST 15 01/11/2022 0528   ALT 14 01/11/2022 0528   BILITOT 0.2 (L) 01/11/2022 0528      Lab Results  Component Value Date   WBC 7.4 01/11/2022   HGB 10.0 (L) 01/11/2022   HCT 29.5 (L) 01/11/2022   MCV 91.9 01/11/2022   PLT 211 01/11/2022   Lab Results  Component Value Date   TSH 5.040 (H) 01/11/2022     Chemistry      Component Value Date/Time   NA 139 01/11/2022 0528   K 3.7 01/11/2022 0528   CL 102 01/11/2022 0528   CO2 29 01/11/2022 0528   BUN <5 (L) 01/11/2022 0528   CREATININE 0.98 01/11/2022 0528      Component Value Date/Time   CALCIUM 8.3 (L) 01/11/2022 0528   ALKPHOS 42 01/11/2022 0528   AST 15 01/11/2022 0528   ALT 14 01/11/2022 0528   BILITOT 0.2 (L) 01/11/2022 0528         Total time spent on today's visit was *** minutes, including both face-to-face time and nonface-to-face time.  Time included that spent on review of records (prior notes available to me/labs/imaging if pertinent), discussing treatment and goals, answering patient's questions and coordinating care.  Cc:  Tisovec, Fransico Him, MD

## 2022-04-02 ENCOUNTER — Encounter: Payer: Self-pay | Admitting: Neurology

## 2022-04-02 ENCOUNTER — Ambulatory Visit (INDEPENDENT_AMBULATORY_CARE_PROVIDER_SITE_OTHER): Payer: Commercial Managed Care - PPO | Admitting: Neurology

## 2022-04-02 VITALS — BP 118/78 | HR 72 | Ht 69.0 in | Wt 174.6 lb

## 2022-04-02 DIAGNOSIS — G250 Essential tremor: Secondary | ICD-10-CM | POA: Diagnosis not present

## 2022-04-02 NOTE — Patient Instructions (Signed)
Your options:   Deep brain stimulation  Focused ultrasound  Botox trial in [place] for ET The physicians and staff at [facility] are committed to providing excellent care. You may receive a survey requesting feedback about your experience at our office. We strive to receive "very good" responses to the survey questions. If you feel that your experience would prevent you from giving the office a "very good " response, please contact our office to try to remedy the situation. We may be reached at [phone]. Thank you for taking the time out of your busy day to complete the survey.

## 2022-05-25 ENCOUNTER — Other Ambulatory Visit: Payer: Self-pay | Admitting: Neurology

## 2022-05-25 DIAGNOSIS — G250 Essential tremor: Secondary | ICD-10-CM

## 2022-08-16 ENCOUNTER — Other Ambulatory Visit: Payer: Self-pay | Admitting: Neurology

## 2022-08-16 DIAGNOSIS — G250 Essential tremor: Secondary | ICD-10-CM

## 2022-10-02 NOTE — Progress Notes (Deleted)
Assessment/Plan:    1.  Essential Tremor  -continue primidone, 50 mg, 3 tablets in the morning, 2 in the afternoon, 3 in the evening.  Do not think another increase will help significantly in terms of tremor control.  -cannot take propranolol - had syncope with it due to low BP   2.  History of discitis and disc herniation  -Status post surgical intervention in December, 2023 with Dr. Franky Macho   3..f/u 6 months.  Subjective:   Cameron [name] was seen today in follow up for essential tremor.  My previous records were reviewed prior to todays visit.  We have not changed his medications, because we did not think this would make a significant difference.  We have been discussing possible surgical interventions, but we have decided to hold for at least 6 to 9 months last visit to see how he did, as he had just undergone some back surgery at the end of last year.  In addition, I wanted to make sure that he remained tobacco free.  Current prescribed movement disorder medications: Primidone, 50 mg, 3 tablets in the morning, 2 in the afternoon, 3 in the evening (he misses middle of the day dosage 50% of the time)   Previous medications: Inderal, 20 mg twice per day (syncope)   ALLERGIES:  No Known Allergies  CURRENT MEDICATIONS:  Outpatient Encounter Medications as of 10/03/2022  Medication Sig   clonazePAM (KLONOPIN) 0.5 MG tablet Take 0.25 mg by mouth at bedtime.   meloxicam (MOBIC) 7.5 MG tablet Take 7.5 mg by mouth daily.   omeprazole (PRILOSEC OTC) 20 MG tablet Take 20 mg by mouth daily.   PREVIDENT 5000 BOOSTER PLUS 1.1 % PSTE Place 1 Application onto teeth at bedtime.   primidone (MYSOLINE) 50 MG tablet TAKE 3 TABLETS BY MOUTH EVERY MORNING, 2 TABLETS IN THE AFTERNOON, AND 3 TABLETS AT NIGHT.   rosuvastatin (CRESTOR) 20 MG tablet Take 1 tablet (20 mg total) by mouth daily.   sertraline (ZOLOFT) 50 MG tablet 1 tablet Orally Once a day   No facility-administered encounter  medications on file as of 10/03/2022.     Objective:    PHYSICAL EXAMINATION:    VITALS:   There were no vitals filed for this visit.   GEN:  The patient appears stated age and is in NAD. HEENT:  Normocephalic, atraumatic.  The mucous membranes are moist.   Neurological examination:  Orientation: The patient is alert and oriented x3. Cranial nerves: There is good facial symmetry. The speech is fluent and clear. Soft palate rises symmetrically and there is no tongue deviation. Hearing is intact to conversational tone. Sensation: Sensation is intact to light touch throughout Motor: Strength is at least antigravity x4.  Movement examination: Tone: There is normal tone in the UE/LE Abnormal movements: there is RUE >> left upper extremity postural and intention tremor.  It is moderate on the right and mild to moderate on the left.  This is similar to previous. Coordination:  There is no decremation with RAM's Gait and Station: Patient walks fairly well in my hallway  I have reviewed and interpreted the following labs independently   Chemistry      Component Value Date/Time   NA 139 01/11/2022 0528   K 3.7 01/11/2022 0528   CL 102 01/11/2022 0528   CO2 29 01/11/2022 0528   BUN <5 (L) 01/11/2022 0528   CREATININE 0.98 01/11/2022 0528      Component Value Date/Time  CALCIUM 8.3 (L) 01/11/2022 0528   ALKPHOS 42 01/11/2022 0528   AST 15 01/11/2022 0528   ALT 14 01/11/2022 0528   BILITOT 0.2 (L) 01/11/2022 0528      Lab Results  Component Value Date   WBC 7.4 01/11/2022   HGB 10.0 (L) 01/11/2022   HCT 29.5 (L) 01/11/2022   MCV 91.9 01/11/2022   PLT 211 01/11/2022   Lab Results  Component Value Date   TSH 5.040 (H) 01/11/2022     Chemistry      Component Value Date/Time   NA 139 01/11/2022 0528   K 3.7 01/11/2022 0528   CL 102 01/11/2022 0528   CO2 29 01/11/2022 0528   BUN <5 (L) 01/11/2022 0528   CREATININE 0.98 01/11/2022 0528      Component Value  Date/Time   CALCIUM 8.3 (L) 01/11/2022 0528   ALKPHOS 42 01/11/2022 0528   AST 15 01/11/2022 0528   ALT 14 01/11/2022 0528   BILITOT 0.2 (L) 01/11/2022 0528         Total time spent on today's visit was *** minutes, including both face-to-face time and nonface-to-face time.  Time included that spent on review of records (prior notes available to me/labs/imaging if pertinent), discussing treatment and goals, answering patient's questions and coordinating care.  Cc:  Tisovec, Adelfa Koh, MD

## 2022-10-03 ENCOUNTER — Ambulatory Visit: Payer: Commercial Managed Care - PPO | Admitting: Neurology

## 2022-10-03 ENCOUNTER — Encounter: Payer: Self-pay | Admitting: Neurology

## 2022-10-03 DIAGNOSIS — Z029 Encounter for administrative examinations, unspecified: Secondary | ICD-10-CM

## 2022-10-08 NOTE — Progress Notes (Unsigned)
Assessment/Plan:    1.  Essential Tremor  -continue primidone, 50 mg, 3 tablets in the morning, 2 in the afternoon, 3 in the evening.  Do not think another increase will help significantly in terms of tremor control.  -cannot take propranolol - had syncope with it due to low BP   2.  History of discitis and disc herniation  -Status post surgical intervention in December, 2023 with Dr. Franky Macho   3..f/u 6 months.  Subjective:   Cameron [name] was seen today in follow up for essential tremor.  My previous records were reviewed prior to todays visit.  We have not changed his medications, because we did not think this would make a significant difference.  We have been discussing possible surgical interventions, but we have decided to hold for at least 6 to 9 months last visit to see how he did, as he had just undergone some back surgery at the end of last year.  In addition, I wanted to make sure that he remained tobacco free.  Current prescribed movement disorder medications: Primidone, 50 mg, 3 tablets in the morning, 2 in the afternoon, 3 in the evening (he misses middle of the day dosage 50% of the time)   Previous medications: Inderal, 20 mg twice per day (syncope)   ALLERGIES:  No Known Allergies  CURRENT MEDICATIONS:  Outpatient Encounter Medications as of 10/10/2022  Medication Sig   clonazePAM (KLONOPIN) 0.5 MG tablet Take 0.25 mg by mouth at bedtime.   meloxicam (MOBIC) 7.5 MG tablet Take 7.5 mg by mouth daily.   omeprazole (PRILOSEC OTC) 20 MG tablet Take 20 mg by mouth daily.   PREVIDENT 5000 BOOSTER PLUS 1.1 % PSTE Place 1 Application onto teeth at bedtime.   primidone (MYSOLINE) 50 MG tablet TAKE 3 TABLETS BY MOUTH EVERY MORNING, 2 TABLETS IN THE AFTERNOON, AND 3 TABLETS AT NIGHT.   rosuvastatin (CRESTOR) 20 MG tablet Take 1 tablet (20 mg total) by mouth daily.   sertraline (ZOLOFT) 50 MG tablet 1 tablet Orally Once a day   No facility-administered encounter  medications on file as of 10/10/2022.     Objective:    PHYSICAL EXAMINATION:    VITALS:   There were no vitals filed for this visit.   GEN:  The patient appears stated age and is in NAD. HEENT:  Normocephalic, atraumatic.  The mucous membranes are moist.   Neurological examination:  Orientation: The patient is alert and oriented x3. Cranial nerves: There is good facial symmetry. The speech is fluent and clear. Soft palate rises symmetrically and there is no tongue deviation. Hearing is intact to conversational tone. Sensation: Sensation is intact to light touch throughout Motor: Strength is at least antigravity x4.  Movement examination: Tone: There is normal tone in the UE/LE Abnormal movements: there is RUE >> left upper extremity postural and intention tremor.  It is moderate on the right and mild to moderate on the left.  This is similar to previous. Coordination:  There is no decremation with RAM's Gait and Station: Patient walks fairly well in my hallway  I have reviewed and interpreted the following labs independently   Chemistry      Component Value Date/Time   NA 139 01/11/2022 0528   K 3.7 01/11/2022 0528   CL 102 01/11/2022 0528   CO2 29 01/11/2022 0528   BUN <5 (L) 01/11/2022 0528   CREATININE 0.98 01/11/2022 0528      Component Value Date/Time  CALCIUM 8.3 (L) 01/11/2022 0528   ALKPHOS 42 01/11/2022 0528   AST 15 01/11/2022 0528   ALT 14 01/11/2022 0528   BILITOT 0.2 (L) 01/11/2022 0528      Lab Results  Component Value Date   WBC 7.4 01/11/2022   HGB 10.0 (L) 01/11/2022   HCT 29.5 (L) 01/11/2022   MCV 91.9 01/11/2022   PLT 211 01/11/2022   Lab Results  Component Value Date   TSH 5.040 (H) 01/11/2022     Chemistry      Component Value Date/Time   NA 139 01/11/2022 0528   K 3.7 01/11/2022 0528   CL 102 01/11/2022 0528   CO2 29 01/11/2022 0528   BUN <5 (L) 01/11/2022 0528   CREATININE 0.98 01/11/2022 0528      Component Value Date/Time    CALCIUM 8.3 (L) 01/11/2022 0528   ALKPHOS 42 01/11/2022 0528   AST 15 01/11/2022 0528   ALT 14 01/11/2022 0528   BILITOT 0.2 (L) 01/11/2022 0528         Total time spent on today's visit was *** minutes, including both face-to-face time and nonface-to-face time.  Time included that spent on review of records (prior notes available to me/labs/imaging if pertinent), discussing treatment and goals, answering patient's questions and coordinating care.  Cc:  Tisovec, Adelfa Koh, MD

## 2022-10-10 ENCOUNTER — Encounter: Payer: Self-pay | Admitting: Neurology

## 2022-10-10 ENCOUNTER — Ambulatory Visit (INDEPENDENT_AMBULATORY_CARE_PROVIDER_SITE_OTHER): Payer: 59 | Admitting: Neurology

## 2022-10-10 VITALS — BP 116/60 | HR 61 | Wt 189.4 lb

## 2022-10-10 DIAGNOSIS — G250 Essential tremor: Secondary | ICD-10-CM

## 2022-10-10 NOTE — Patient Instructions (Signed)
Good to see you today!  The physicians and staff at [facility] are committed to providing excellent care. You may receive a survey requesting feedback about your experience at our office. We strive to receive "very good" responses to the survey questions. If you feel that your experience would prevent you from giving the office a "very good " response, please contact our office to try to remedy the situation. We may be reached at [phone]. Thank you for taking the time out of your busy day to complete the survey.     

## 2022-11-12 ENCOUNTER — Other Ambulatory Visit: Payer: Self-pay | Admitting: Neurology

## 2022-11-12 DIAGNOSIS — G250 Essential tremor: Secondary | ICD-10-CM

## 2023-02-12 ENCOUNTER — Other Ambulatory Visit: Payer: Self-pay | Admitting: Neurology

## 2023-02-12 DIAGNOSIS — G250 Essential tremor: Secondary | ICD-10-CM

## 2023-05-06 ENCOUNTER — Other Ambulatory Visit: Payer: Self-pay | Admitting: Neurology

## 2023-05-06 DIAGNOSIS — G250 Essential tremor: Secondary | ICD-10-CM

## 2023-06-07 NOTE — Progress Notes (Signed)
 Assessment/Plan:    1.  Essential Tremor  -continue primidone, 50 mg, 3 tablets in the morning, 2 in the afternoon, 3 in the evening.  Do not think another increase will help significantly in terms of tremor control.  -cannot take propranolol - had syncope with it due to low BP  - We discussed again surgical interventions.  He is thinking about those, but not ready to commit to anything.  He would be a left VIM to target the R hand.    -he is free of tobacco and alcohol and I am really proud of him.  -pt knows that without surgery, he will have to deal with tremor.     2.  History of discitis and disc herniation  -Status post surgical intervention in December, 2023 with Dr. Franky Macho   3.  f/u 6-8 months  Subjective:   Cameron [name] was seen today in follow up for essential tremor.  My previous records were reviewed prior to todays visit.  We have not changed his medications, because we did not think this would make a significant difference.  He has been contemplating surgical intervention for a while and "hasn't completely ruled it out" but hasn't decided to proceed.  He is continued tobacco and alcohol free.  He remains on primidone.  He is still shaking.  He saw his primary care December 13.  The notes that are available are reviewed.  Current prescribed movement disorder medications: Primidone, 50 mg, 3 tablets in the morning, 2 in the afternoon, 3 in the evening    Previous medications: Inderal, 20 mg twice per day (syncope)   ALLERGIES:  No Known Allergies  CURRENT MEDICATIONS:  Outpatient Encounter Medications as of 06/10/2023  Medication Sig   clonazePAM (KLONOPIN) 0.5 MG tablet Take 0.25 mg by mouth at bedtime.   meloxicam (MOBIC) 7.5 MG tablet Take 7.5 mg by mouth daily.   omeprazole (PRILOSEC OTC) 20 MG tablet Take 20 mg by mouth daily.   PREVIDENT 5000 BOOSTER PLUS 1.1 % PSTE Place 1 Application onto teeth at bedtime.   primidone (MYSOLINE) 50 MG tablet TAKE 3  TABLETS BY MOUTH IN THE MORNING,2TABS IN THE AFTERNOON AND 3TABS AT NIGHT EVERY DAY   rosuvastatin (CRESTOR) 20 MG tablet Take 1 tablet (20 mg total) by mouth daily.   sertraline (ZOLOFT) 50 MG tablet 1 tablet Orally Once a day   No facility-administered encounter medications on file as of 06/10/2023.     Objective:    PHYSICAL EXAMINATION:    VITALS:   Vitals:   06/10/23 1101  BP: 120/70  Pulse: 78  SpO2: 97%  Weight: 199 lb (90.3 kg)      GEN:  The patient appears stated age and is in NAD. HEENT:  Normocephalic, atraumatic.  The mucous membranes are moist.  Cv:  rrr Lungs:  ctab Neck:  no bruits  Neurological examination:  Orientation: The patient is alert and oriented x3. Cranial nerves: There is good facial symmetry. The speech is fluent and clear. Soft palate rises symmetrically and there is no tongue deviation. Hearing is intact to conversational tone. Sensation: Sensation is intact to light touch throughout Motor: Strength is at least antigravity x4.  Movement examination: Tone: There is normal tone in the UE/LE Abnormal movements: there is RUE >> left upper extremity postural and intention tremor.  It is moderate on the right and mild to moderate on the L today.  Archimedes spirals with tremor R>>L  Coordination:  There is no decremation with RAM's Gait and Station: Ambulates well out of the office.  I have reviewed and interpreted the following labs independently   Chemistry      Component Value Date/Time   NA 139 01/11/2022 0528   K 3.7 01/11/2022 0528   CL 102 01/11/2022 0528   CO2 29 01/11/2022 0528   BUN <5 (L) 01/11/2022 0528   CREATININE 0.98 01/11/2022 0528      Component Value Date/Time   CALCIUM 8.3 (L) 01/11/2022 0528   ALKPHOS 42 01/11/2022 0528   AST 15 01/11/2022 0528   ALT 14 01/11/2022 0528   BILITOT 0.2 (L) 01/11/2022 0528      Lab Results  Component Value Date   WBC 7.4 01/11/2022   HGB 10.0 (L) 01/11/2022   HCT 29.5  (L) 01/11/2022   MCV 91.9 01/11/2022   PLT 211 01/11/2022   Lab Results  Component Value Date   TSH 5.040 (H) 01/11/2022     Chemistry      Component Value Date/Time   NA 139 01/11/2022 0528   K 3.7 01/11/2022 0528   CL 102 01/11/2022 0528   CO2 29 01/11/2022 0528   BUN <5 (L) 01/11/2022 0528   CREATININE 0.98 01/11/2022 0528      Component Value Date/Time   CALCIUM 8.3 (L) 01/11/2022 0528   ALKPHOS 42 01/11/2022 0528   AST 15 01/11/2022 0528   ALT 14 01/11/2022 0528   BILITOT 0.2 (L) 01/11/2022 0528         Total time spent on today's visit was 30 minutes, including both face-to-face time and nonface-to-face time.  Time included that spent on review of records (prior notes available to me/labs/imaging if pertinent), discussing treatment and goals, answering patient's questions and coordinating care.  Cc:  Tisovec, Adelfa Koh, MD

## 2023-06-10 ENCOUNTER — Ambulatory Visit (INDEPENDENT_AMBULATORY_CARE_PROVIDER_SITE_OTHER): Payer: 59 | Admitting: Neurology

## 2023-06-10 VITALS — BP 120/70 | HR 78 | Wt 199.0 lb

## 2023-06-10 DIAGNOSIS — G250 Essential tremor: Secondary | ICD-10-CM

## 2023-08-02 ENCOUNTER — Other Ambulatory Visit: Payer: Self-pay | Admitting: Neurology

## 2023-08-02 DIAGNOSIS — G250 Essential tremor: Secondary | ICD-10-CM

## 2023-11-04 ENCOUNTER — Other Ambulatory Visit: Payer: Self-pay | Admitting: Neurology

## 2023-11-04 DIAGNOSIS — G250 Essential tremor: Secondary | ICD-10-CM

## 2024-02-05 ENCOUNTER — Other Ambulatory Visit: Payer: Self-pay | Admitting: Neurology

## 2024-02-05 DIAGNOSIS — G250 Essential tremor: Secondary | ICD-10-CM

## 2024-02-07 NOTE — Progress Notes (Unsigned)
 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 Avoids further surgery due to past complications. - Consider following up with Dr. Cabbell to consider further imaging to assess lumbar spine status. - Discuss non-surgical interventions such as injections. - Had surgical intervention in December, 2023, but pain is increasing again  Essential tremor Tremor persists despite primidone . Cala trio device discussed but declined due to insurance and preference. -continue primidone , 50 mg, 3 tablets in the morning, 2 in the afternoon, 3 in the evening.  Do not think another increase will help significantly in terms of tremor control. -cannot take propranolol  - had syncope with it due to [name] BP - Discussed surgeries again, but is not ready.  He does recognize about surgical intervention, he will have to deal with tremor.  Follow-up 1 year   Subjective:    Discussed the use of AI scribe software for clinical note transcription with the patient, who gave verbal consent to proceed.  History of Present Illness Cameron [name] is a 73 year old male with essential tremor who presents for management of tremors and back pain.  He experiences persistent tremors despite taking primidone , 3 tablets in the morning, 2 in the afternoon, and 3 in the evening. The tremors remain significant, affecting his daily activities.  He has significant back pain following a back surgery in December 2023. The pain is described as sharp, like a 'knife,' and is triggered by certain movements. He had to relearn how to walk post-surgery due to disc issues. He has not returned for follow-up and prefers to manage the pain himself to avoid hospital stays, as he was hospitalized for 33-34 days post-surgery and had a subsequent readmission for C. diff infection. He manages the pain with 4  ibuprofens every couple of days, depending on his activity level.  He is able to perform daily activities such as mowing, weeding, and washing clothes, but he is cautious about his movements due to the pain. He mentions having other bulging discs but chooses to live with the pain rather than seek further surgical intervention.  No smoking. Reports significant tremors and back pain.    Current prescribed movement disorder medications: Primidone , 50 mg, 3 tablets in the morning, 2 in the afternoon, 3 in the evening    Previous medications: Inderal , 20 mg twice per day (syncope)   ALLERGIES:  No Known Allergies  CURRENT MEDICATIONS:  Outpatient Encounter Medications as of 02/11/2024  Medication Sig   clonazePAM  (KLONOPIN ) 0.5 MG tablet Take 0.25 mg by mouth at bedtime.   meloxicam (MOBIC) 7.5 MG tablet Take 7.5 mg by mouth daily.   omeprazole  (PRILOSEC  OTC) 20 MG tablet Take 20 mg by mouth daily.   PREVIDENT 5000 BOOSTER PLUS 1.1 % PSTE Place 1 Application onto teeth at bedtime.   primidone  (MYSOLINE ) 50 MG tablet TAKE 3 TABLETS BY MOUTH EVERY MORNING, 2 TABS EVERY AFTERNOON, AND 3 TABS NIGHTLY   rosuvastatin  (CRESTOR ) 20 MG tablet Take 1 tablet (20 mg total) by mouth daily.   sertraline  (ZOLOFT ) 50 MG tablet 1 tablet Orally Once a day   No facility-administered encounter medications on file as of 02/11/2024.     Objective:    PHYSICAL EXAMINATION:    VITALS:   Vitals:   02/11/24 1100  BP: 124/84  Pulse: 74  SpO2: 97%  Weight: 203 lb 9.6 oz (92.4 kg)    GEN:  The patient appears stated age and is in NAD. HEENT:  Normocephalic, atraumatic.  The mucous membranes are moist.  Cv:  rrr Lungs:  ctab Neck:  no bruits  Neurological examination:  Orientation: The patient is alert and oriented x3. Cranial nerves: There is good facial symmetry. The speech is fluent and clear. Soft palate rises symmetrically and there is no tongue deviation. Hearing is intact to conversational  tone. Sensation: Sensation is intact to light touch throughout Motor: Strength is at least antigravity x4.  Movement examination: Tone: There is normal tone in the UE/LE Abnormal movements: there is RUE >> left upper extremity postural and intention tremor.  It is moderate on the right and mild to moderate on the L today.  Archimedes spirals with tremor R>>L.  This is similar to last visit. Coordination:  There is no decremation with RAM's   I have reviewed and interpreted the following labs independently   Chemistry      Component Value Date/Time   NA 139 01/11/2022 0528   K 3.7 01/11/2022 0528   CL 102 01/11/2022 0528   CO2 29 01/11/2022 0528   BUN <5 (L) 01/11/2022 0528   CREATININE 0.98 01/11/2022 0528      Component Value Date/Time   CALCIUM  8.3 (L) 01/11/2022 0528   ALKPHOS 42 01/11/2022 0528   AST 15 01/11/2022 0528   ALT 14 01/11/2022 0528   BILITOT 0.2 (L) 01/11/2022 0528      Lab Results  Component Value Date   WBC 7.4 01/11/2022   HGB 10.0 (L) 01/11/2022   HCT 29.5 (L) 01/11/2022   MCV 91.9 01/11/2022   PLT 211 01/11/2022   Lab Results  Component Value Date   TSH 5.040 (H) 01/11/2022     Chemistry      Component Value Date/Time   NA 139 01/11/2022 0528   K 3.7 01/11/2022 0528   CL 102 01/11/2022 0528   CO2 29 01/11/2022 0528   BUN <5 (L) 01/11/2022 0528   CREATININE 0.98 01/11/2022 0528      Component Value Date/Time   CALCIUM  8.3 (L) 01/11/2022 0528   ALKPHOS 42 01/11/2022 0528   AST 15 01/11/2022 0528   ALT 14 01/11/2022 0528   BILITOT 0.2 (L) 01/11/2022 0528         Total time spent on today's visit was 33 minutes, including both face-to-face time and nonface-to-face time.  Time included that spent on review of records (prior notes available to me/labs/imaging if pertinent), discussing treatment and goals, answering patient's questions and coordinating care.  Cc:  Tisovec, Charlie ORN, MD

## 2024-02-11 ENCOUNTER — Ambulatory Visit: Admitting: Neurology

## 2024-02-11 VITALS — BP 124/84 | HR 74 | Wt 203.6 lb

## 2024-02-11 DIAGNOSIS — M545 Low back pain, unspecified: Secondary | ICD-10-CM

## 2024-02-11 DIAGNOSIS — G250 Essential tremor: Secondary | ICD-10-CM

## 2024-02-11 DIAGNOSIS — G8929 Other chronic pain: Secondary | ICD-10-CM

## 2024-02-11 NOTE — Patient Instructions (Signed)
   VISIT SUMMARY: You visited us  today to discuss the management of your persistent tremors and back pain. Despite taking your current medication, your tremors continue to affect your daily activities. You also experience significant back pain following your surgery in December 2023, which you manage with ibuprofen. We discussed potential next steps for both issues.  YOUR PLAN: -CHRONIC LOW BACK PAIN AFTER LUMBAR SURGERY: Chronic low back pain is ongoing pain in the lower back that persists after surgery. We recommend considering following up with Dr. Cabbell to consider further imaging to assess the current status of your lumbar spine and discussing non-surgical interventions such as injections to help manage the pain.  -ESSENTIAL TREMOR: Essential tremor is a nervous system disorder that causes involuntary and rhythmic shaking. Despite taking primidone , your tremors persist. We discussed the Cala trio device, but you declined it due to insurance and personal preference. You should continue taking primidone  as prescribed.  INSTRUCTIONS: Please consider following up with Dr. Gillie.   We also recommend discussing non-surgical pain management options, such as injections, during your next visit. Continue taking primidone  for your tremors and monitor your symptoms. Follow up with us  if your condition changes or if you have any concerns.                      Contains text generated by [employer].                                 Contains text generated by [employer].

## 2025-02-10 ENCOUNTER — Ambulatory Visit: Admitting: Neurology
